# Patient Record
Sex: Female | Born: 1990 | Race: White | Hispanic: No | Marital: Married | Smoking: Never smoker
Health system: Southern US, Community
[De-identification: ages and names within clinical notes are randomized; demographics above are authoritative.]

## PROBLEM LIST (undated history)

## (undated) DIAGNOSIS — N750 Cyst of Bartholin's gland: Secondary | ICD-10-CM

## (undated) DIAGNOSIS — B079 Viral wart, unspecified: Secondary | ICD-10-CM

## (undated) DIAGNOSIS — I10 Essential (primary) hypertension: Secondary | ICD-10-CM

## (undated) DIAGNOSIS — F419 Anxiety disorder, unspecified: Secondary | ICD-10-CM

## (undated) DIAGNOSIS — R519 Headache, unspecified: Secondary | ICD-10-CM

## (undated) DIAGNOSIS — F32A Depression, unspecified: Secondary | ICD-10-CM

## (undated) DIAGNOSIS — G8929 Other chronic pain: Secondary | ICD-10-CM

## (undated) DIAGNOSIS — G43909 Migraine, unspecified, not intractable, without status migrainosus: Secondary | ICD-10-CM

## (undated) DIAGNOSIS — R51 Headache: Secondary | ICD-10-CM

## (undated) HISTORY — DX: Viral wart, unspecified: B07.9

## (undated) HISTORY — DX: Cyst of Bartholin's gland: N75.0

## (undated) HISTORY — DX: Other chronic pain: G89.29

## (undated) HISTORY — DX: Headache, unspecified: R51.9

## (undated) HISTORY — DX: Anxiety disorder, unspecified: F41.9

## (undated) HISTORY — DX: Headache: R51

## (undated) HISTORY — DX: Essential (primary) hypertension: I10

## (undated) HISTORY — DX: Migraine, unspecified, not intractable, without status migrainosus: G43.909

## (undated) HISTORY — DX: Depression, unspecified: F32.A

---
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
## 2010-06-19 ENCOUNTER — Ambulatory Visit: Payer: Self-pay | Admitting: Family Medicine

## 2010-06-19 DIAGNOSIS — R5381 Other malaise: Secondary | ICD-10-CM | POA: Insufficient documentation

## 2010-06-19 DIAGNOSIS — R5383 Other fatigue: Secondary | ICD-10-CM

## 2010-06-19 DIAGNOSIS — L738 Other specified follicular disorders: Secondary | ICD-10-CM | POA: Insufficient documentation

## 2010-06-20 ENCOUNTER — Encounter: Payer: Self-pay | Admitting: Family Medicine

## 2010-06-20 LAB — CONVERTED CEMR LAB
ALT: 12 units/L (ref 0–35)
AST: 20 units/L (ref 0–37)
Albumin: 4.7 g/dL (ref 3.5–5.2)
Alkaline Phosphatase: 76 units/L (ref 39–117)
BUN: 11 mg/dL (ref 6–23)
CO2: 25 meq/L (ref 19–32)
Calcium: 10.1 mg/dL (ref 8.4–10.5)
Chloride: 106 meq/L (ref 96–112)
Creatinine, Ser: 0.67 mg/dL (ref 0.40–1.20)
Ferritin: 43 ng/mL (ref 10–291)
Glucose, Bld: 99 mg/dL (ref 70–99)
HCT: 43.3 % (ref 36.0–46.0)
Hemoglobin: 14 g/dL (ref 12.0–15.0)
MCHC: 32.3 g/dL (ref 30.0–36.0)
MCV: 82.6 fL (ref 78.0–100.0)
Platelets: 264 10*3/uL (ref 150–400)
Potassium: 3.9 meq/L (ref 3.5–5.3)
RBC: 5.24 M/uL — ABNORMAL HIGH (ref 3.87–5.11)
RDW: 13.4 % (ref 11.5–15.5)
Sodium: 142 meq/L (ref 135–145)
Total Bilirubin: 0.3 mg/dL (ref 0.3–1.2)
Total Protein: 7.5 g/dL (ref 6.0–8.3)
Vitamin B-12: 290 pg/mL (ref 211–911)
WBC: 9 10*3/uL (ref 4.0–10.5)

## 2010-07-13 ENCOUNTER — Telehealth: Payer: Self-pay | Admitting: Family Medicine

## 2010-07-14 ENCOUNTER — Encounter: Payer: Self-pay | Admitting: Family Medicine

## 2010-08-29 NOTE — Assessment & Plan Note (Signed)
Summary: Acute pharyngitis   Vital Signs:  Patient profile:   20 year old female Height:      65.75 inches Weight:      171 pounds BMI:     27.91 O2 Sat:      99 % Temp:     98.8 degrees F oral Pulse rate:   100 / minute BP sitting:   146 / 88  (right arm) Cuff size:   regular  Vitals Entered BySelena Batten Johnson/April (September 16, 2009 1:47 PM) CC: c/o sore throat, congestion and cough x 5 days   Primary Care Provider:  Seymour Bars DO  CC:  c/o sore throat and congestion and cough x 5 days.  History of Present Illness: c/o sore throat, chest congestion and cough x 5 days.  No ST.  Some bilat ear pressure started yesterday. Fever low grade. Treated with Advil and fever resolved. sTarted withDayquail and then tried Theraflu for last 2 days. At night time taking Nyquail.  + sickcontacts.  Has some bodyaches the first day.  Never had flu shot. Throats hurts worse but not as tired.  Sleeping alot.    Current Medications (verified): 1)  Amitriptyline Hcl 25 Mg Tabs (Amitriptyline Hcl) .Marland Kitchen.. 1 Tab By Mouth Qhs 2)  Drysol 20 % Soln (Aluminum Chloride) .... Apply At Night 3)  Amerge 2.5 Mg Tabs (Naratriptan Hcl) .Marland Kitchen.. 1 Tab By Mouth X 1, Repeat in 4 Hrs If Needed 4)  Ibuprofen 800 Mg Tabs (Ibuprofen) .Marland Kitchen.. 1 Tab By Mouth Three Times A Day As Needed With Food For Headaches  Allergies (verified): No Known Drug Allergies  Physical Exam  General:  Well-developed,well-nourished,in no acute distress; alert,appropriate and cooperative throughout examination Head:  Normocephalic and atraumatic without obvious abnormalities. No apparent alopecia or balding. Eyes:  No corneal or conjunctival inflammation noted. EOMI. Perrla. Ears:  External ear exam shows no significant lesions or deformities.  Otoscopic examination reveals clear canals, tympanic membranes are intact bilaterally without bulging, retraction, inflammation or discharge. Hearing is grossly normal bilaterally. Nose:  External nasal  examination shows no deformity or inflammation. Nasal mucosa are pink and moist without lesions or exudates. Mouth:  Tonsil are swollen.  Neck:  No deformities, masses, or tenderness noted. Lungs:  Normal respiratory effort, chest expands symmetrically. Lungs are clear to auscultation, no crackles or wheezes. Heart:  Normal rate and regular rhythm. S1 and S2 normal without gallop, murmur, click, rub or other extra sounds. Skin:  no rashes.   Cervical Nodes:  No lymphadenopathy noted Psych:  Cognition and judgment appear intact. Alert and cooperative with normal attention span and concentration. No apparent delusions, illusions, hallucinations   Impression & Recommendations:  Problem # 1:  ACUTE PHARYNGITIS (ICD-462) Likely viral. Tonsils are swollen but no erythema. Salt water gargles and lozenges. If not 50% better by Tuesday then call the office and will treat for sinusitis.  Lungs are clear. No sign of bronchitis.    Her updated medication list for this problem includes:    Ibuprofen 800 Mg Tabs (Ibuprofen) .Marland Kitchen... 1 tab by mouth three times a day as needed with food for headaches  Complete Medication List: 1)  Amitriptyline Hcl 25 Mg Tabs (Amitriptyline hcl) .Marland Kitchen.. 1 tab by mouth qhs 2)  Drysol 20 % Soln (Aluminum chloride) .... Apply at night 3)  Amerge 2.5 Mg Tabs (Naratriptan hcl) .Marland Kitchen.. 1 tab by mouth x 1, repeat in 4 hrs if needed 4)  Ibuprofen 800 Mg Tabs (Ibuprofen) .Marland Kitchen.. 1 tab by  mouth three times a day as needed with food for headaches

## 2010-08-29 NOTE — Assessment & Plan Note (Signed)
Summary: folliculitis   Vital Signs:  Patient profile:   20 year old female Height:      65.75 inches Weight:      176 pounds Temp:     98.3 degrees F oral Pulse rate:   66 / minute BP sitting:   123 / 65  (left arm) Cuff size:   regular  Vitals Entered By: Kathlene November LPN (June 19, 2010 1:20 PM) CC: breaking out with pin head size bumps on  chest and legs- they come and go and leave dark red spots where they were. Onset few months ago   Primary Care Provider:  Seymour Bars DO  CC:  breaking out with pin head size bumps on  chest and legs- they come and go and leave dark red spots where they were. Onset few months ago.  History of Present Illness: 20 yo WF presents for tiny pustules on her chest and legs that started about 6 wks ago.  She denies any pain.  She thought it was spider bites at the onset but it spread to her chest and upper back.  Denies any itching.  Denies new soaps or lotions.  Not around any pets.    She c/o being tired all the time.  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    Current Medications (verified): 1)  Topamax 100 Mg Tabs (Topiramate) .... Take 1 Tab By Mouth At Bedtime 2)  Imitrex 50 Mg Tabs (Sumatriptan Succinate) .... Take As Directed As Needed  Allergies (verified): No Known Drug Allergies  Comments:  Nurse/Medical Assistant: The patient's medications and allergies were reviewed with the patient and were updated in the Medication and Allergy Lists. Kathlene November LPN (June 19, 2010 1:22 PM)  Social History: Reviewed history from 04/11/2009 and no changes required. Lives with mom, dad and younger brother. Student at [employer], lives at home . Involved in drauma.  Review of Systems      See HPI  Physical Exam  General:  alert, well-developed, well-nourished, and well-hydrated.   Mouth:  pharynx pink and moist.   Neck:  no masses.   Lungs:  Normal respiratory effort, chest expands symmetrically. Lungs are clear to  auscultation, no crackles or wheezes. Heart:  Normal rate and regular rhythm. S1 and S2 normal without gallop, murmur, click, rub or other extra sounds. Skin:  tiny pustules over the chest, upper back with healed papular lesions over thighs, non tender with no underlying erythema.  No drainage.  No excoriations or edema  mild comedonal facial acne Cervical Nodes:  No lymphadenopathy noted   Impression & Recommendations:  Problem # 1:  FOLLICULITIS (ICD-704.8) Probable bacterial folliculitis over chest, back and legs. will treat mild case with topical clindamycin foam, dial soap and  water. Call if not improved after 10 days.  Problem # 2:  FATIGUE (ICD-780.79) Will screen for anemia/ iron/ B12 def. today.   Orders: T-CBC No Diff ([id number]) T-Vitamin B12 ([id number]) T-Ferritin ([id number])  Complete Medication List: 1)  Topamax 100 Mg Tabs (Topiramate) .... Take 1 tab by mouth at bedtime 2)  Imitrex 50 Mg Tabs (Sumatriptan succinate) .... Take as directed as needed 3)  Clindamycin Phosphate 1 % Foam (Clindamycin phosphate) .... Apply to rash bid  Other Orders: T-Comprehensive Metabolic Panel ([id number])  Patient Instructions: 1)  Treat folliculitis with topical clindamycin foam 2 x a day.   2)  Use Dial soap once daily in the shower. 3)  Avoid picking lesions. 4)  LABS TODAY.  Will call you w/ results tomorrow. Prescriptions: CLINDAMYCIN PHOSPHATE 1 % FOAM (CLINDAMYCIN PHOSPHATE) apply to rash bid  #100 g x 1   Entered and Authorized by:   Seymour Bars DO   Signed by:   Seymour Bars DO on 06/19/2010   Method used:   Electronically to        [facility] [phone]* (retail)       [address] [place], [place]  [address]       Ph: [phone] or [phone]       Fax: [phone]   RxID:   [id number]    Orders Added: 1)  T-Comprehensive Metabolic Panel [[id number]] 2)  T-CBC No Diff [[id number]] 3)  T-Vitamin B12 [[id number]] 4)  T-Ferritin  [[id number]] 5)  Est. Patient Level III [25956]

## 2010-08-29 NOTE — Assessment & Plan Note (Signed)
Summary: shoulder tendonitis/ URI   Vital Signs:  Patient profile:   20 year old female Height:      65.75 inches Weight:      177 pounds BMI:     28.89 O2 Sat:      100 % on Room air Pulse rate:   99 / minute BP sitting:   137 / 86  (left arm) Cuff size:   regular  Vitals Entered By: Payton Spark CMA (Nov 30, 2009 9:45 AM)  O2 Flow:  Room air CC: R shoulder pain x 2months- no known injury. Also c/o sinus congestion x 4 days.   Primary Care Provider:  Seymour Bars DO  CC:  R shoulder pain x 2months- no known injury. Also c/o sinus congestion x 4 days.Marland Kitchen  History of Present Illness: 20 yo WF presents for problems with R shoulder pain x 2 mos.  She just finished her first year of college.  She did not have any trauma to her shoulder.  She has been going to [employer] and she thinks that she pulled a muscle when weight lifting.  She has pain with pulling up her parking break and can no longer sleep with her L arm under her pillow.  It hurts all the time.  She has not taken anything for pain.  It hurts mostly with the lat pull down machine.  Has pain with full flexion of the R arm.  No radiation of pain.  Has some pain with adduction.  She has had a runny nose, sore throat that began 4 days ago.  She is coughing now.  Has improved some with OTC cold meds.  No F/C, sinus pressure of GI upset.   Current Medications (verified): 1)  Topamax 100 Mg Tabs (Topiramate) .... Take 1 Tab By Mouth At Bedtime 2)  Imitrex 50 Mg Tabs (Sumatriptan Succinate) .... Take As Directed As Needed 3)  Ibuprofen 800 Mg Tabs (Ibuprofen) .Marland Kitchen.. 1 Tab By Mouth Three Times A Day As Needed With Food For Headaches 4)  Baclofen 10 Mg Tabs (Baclofen) .... Take As Directed As Needed  Allergies (verified): No Known Drug Allergies  Past History:  Past Medical History: Reviewed history from 06/28/2008 and no changes required. warts, s/p cryo chronic daily HA  Social History: Reviewed history from 04/11/2009  and no changes required. Lives with mom, dad and younger brother. Student at [employer], lives at home . Involved in drauma.  Review of Systems      See HPI  Physical Exam  General:  alert, well-developed, well-nourished, and well-hydrated.   Head:  normocephalic and atraumatic.  sinuses NTTP Eyes:  conjunctiva clear Ears:  EACs patent; TMs translucent and gray with good cone of light and bony landmarks.  Nose:  nasal congestion with clear- yellow rhinorrhea and boggy turbinates Mouth:  throat mildly injected, good dentition and pharynx pink and moist.   Neck:  no masses.   Lungs:  Normal respiratory effort, chest expands symmetrically. Lungs are clear to auscultation, no crackles or wheezes. Heart:  Normal rate and regular rhythm. S1 and S2 normal without gallop, murmur, click, rub or other extra sounds. Msk:  tender over R AC joint with full active R glenohumeral ROM.  Tender with full R shoulder flexion, external rotation and abduction with + Hawkins test.  grip + 5/5.  full active C spine ROM Pulses:  2+ radial pulses Skin:  color normal.   Cervical Nodes:  shotty anterior cervical chain LA   Impression &  Recommendations:  Problem # 1:  BURSITIS, RIGHT SHOULDER (ICD-726.10) Secondary to new onset weight lifting program. Start treatment with 1) relative rest 2) RX Naprosyn ( while holding the RX ibuprofen ) 3) Icy Hot x 2-3 wks.  If not improved in 3 wks, will get an XRay and consider steroid injection.  Problem # 2:  VIRAL URI (ICD-465.9)  Her updated medication list for this problem includes:    Ibuprofen 800 Mg Tabs (Ibuprofen) .Marland Kitchen... 1 tab by mouth three times a day as needed with food for headaches    Naproxen 500 Mg Tabs (Naproxen) .Marland Kitchen... 1 tab by mouth two times a day with food x 2 wks  Instructed on symptomatic treatment. Call if symptoms persist or worsen.   Complete Medication List: 1)  Topamax 100 Mg Tabs (Topiramate) .... Take 1 tab by mouth at bedtime 2)  Imitrex 50  Mg Tabs (Sumatriptan succinate) .... Take as directed as needed 3)  Ibuprofen 800 Mg Tabs (Ibuprofen) .Marland Kitchen.. 1 tab by mouth three times a day as needed with food for headaches 4)  Baclofen 10 Mg Tabs (Baclofen) .... Take as directed as needed 5)  Naproxen 500 Mg Tabs (Naproxen) .Marland Kitchen.. 1 tab by mouth two times a day with food x 2 wks  Patient Instructions: 1)  Use Naproxen with breakfast and dinner x 2 wks for shoulder tendonitis.  Stop Ibuprofen while you are on this. 2)  Use OTC Mucinex -D for head congestion (get from behind the pharmacy counter) and use Delsym as needed for cough. 3)  Use Icy Hot topically for shoulder pain. 4)  If Shoulder pain has not improved in 3 wks, schedule a follow up appt.   Prescriptions: NAPROXEN 500 MG TABS (NAPROXEN) 1 tab by mouth two times a day with food x 2 wks  #28 x 0   Entered and Authorized by:   Seymour Bars DO   Signed by:   Seymour Bars DO on 11/30/2009   Method used:   [facility] to        [facility] [phone][facility] (retail)       [address]       [place], [place]  [address]       Ph: [phone] or [phone]       Fax: [phone]   RxID:   [id number]

## 2010-08-31 NOTE — Progress Notes (Signed)
Summary: rash no better  Phone Note Call from Patient Call back at Home Phone [phone] Call back at [phone]   Call For: Seymour Bars DO Summary of Call: Pt states rash on chest is worse and has spread to her face. Is using the foam you gave her and didn't know what she needed to do- come in? or call different med in? Initial call taken by: Kathlene November LPN,  July 13, 2010 9:38 AM  Follow-up for Phone Call        I will get her in with derm as soon as possible. Follow-up by: Seymour Bars DO,  July 13, 2010 9:43 AM

## 2010-08-31 NOTE — Consult Note (Signed)
Summary: [facility]   Imported By: Lanelle Bal 08/18/2010 11:15:33  _____________________________________________________________________  External Attachment:    Type:   Image     Comment:   External Document

## 2011-01-15 ENCOUNTER — Encounter: Payer: Self-pay | Admitting: Family Medicine

## 2011-01-16 ENCOUNTER — Encounter: Payer: Self-pay | Admitting: Family Medicine

## 2011-01-16 ENCOUNTER — Ambulatory Visit (INDEPENDENT_AMBULATORY_CARE_PROVIDER_SITE_OTHER): Payer: BC Managed Care – PPO | Admitting: Family Medicine

## 2011-01-16 VITALS — BP 132/83 | HR 91 | Temp 98.4°F | Ht 66.0 in | Wt 182.0 lb

## 2011-01-16 DIAGNOSIS — R197 Diarrhea, unspecified: Secondary | ICD-10-CM

## 2011-01-16 DIAGNOSIS — K589 Irritable bowel syndrome without diarrhea: Secondary | ICD-10-CM | POA: Insufficient documentation

## 2011-01-16 NOTE — Patient Instructions (Signed)
Labs today.  Will call you w/ results tomorrow.  If labs come back normal, start treatment for IBS.  You can certainly add a fiber supplement like Benefiber daily.  Work on dietary changes, stress reduction and you can add Pepcid AC 2 x a day for dyspepsia.   Diet & IBS  (Irritable Bowel Syndrome) No cure has been found for IBS. Many options are available to treat the symptoms. Your caregivers will give you the best treatments available for your symptoms. They will also encourage you to manage stress and make changes to your diet. You need to work with your doctor and Registered Dietician to find the best combination of medicine, diet, counseling, and support to control your symptoms. The following are some diet suggestions. SOME FOOD MAKE IBS WORSE These foods are:  Fatty foods like french fries.   Milk products like cheese or ice cream.   Chocolate.   Alcohol.   Caffeine (found in coffee and some sodas).   Carbonated drinks like soda.  If certain foods cause symptoms, you should eat less of them or stop eating them. CAREFUL EATING REDUCES IBS SYMPTOMS.   Before changing your diet, keep a journal of the foods that seem to cause distress. Then discuss your findings with your caregivers. Write down this information:   What you are eating during the day and when.   What problems are you having after eating?   When do the symptoms occur in relation to your meals?   What foods always make you feel badly?   Take your notes with you to the caregiver to see if you should stop eating certain foods.  SOME FOODS MAKE IBS BETTER. Fiber reduces IBS symptoms, especially constipation, because it makes stools soft, bulky, and easier to pass. Fiber is found in bran, bread, cereal, beans, fruit, and vegetables.  Some examples of foods with fiber:  Fruits.   Apples.   Peaches.   Pears.   Berries.   Figs.    Vegetables.   Broccoli, raw.   Cabbage.   Carrots.  Raw peas.     Breads, cereals, and beans.   Kidney beans.  Lima beans.   Whole-grain bread.  Whole-grain cereal.    Add foods with fiber to your diet a little at a time. This will let your body get used to them. Too much fiber at once might cause gas and swelling of your belly (abdomen). This can trigger symptoms in a person with IBS.   Caregivers usually recommend a diet with enough fiber to produce soft, painless bowel movements. High-fiber diets may cause gas and bloating. These symptoms often go away within a few weeks as your body adjusts. In many cases, dietary fiber may lessen IBS symptoms, particularly constipation. However, it may not help pain or diarrhea. High-fiber diets keep the colon mildly distended (enlarged with the added fiber). This may help prevent spasms in the colon. Some forms of fiber also keep water in the stool, thereby preventing hard stools that are difficult to pass.   Besides telling you to eat more foods with fiber, your caregiver may also tell you to get more fiber by taking a fiber pill or drinking water mixed with a special high-fiber powder. An example of this is a natural fiber laxative containing psyllium seed.  HOW MUCH YOU EAT IS IMPORTANT:  Large meals can cause cramping and diarrhea in people with IBS.   If this happens to you, try eating 4 or 5 small meals  a day.   Or, have your usual 3 meals, but eat less at each meal.   It may also help if your meals are low in fat and high in carbohydrates. Examples of carbohydrates are pasta, rice, whole-grain breads and cereals, fruits and vegetables.   If dairy products cause your symptoms to flare up, you can try eating less of those foods.   You might be able to handle yogurt better than other dairy products because it contains bacteria that helps with digestion.   Dairy products are an important source of calcium and other nutrients. If you need to avoid dairy products, be sure to talk with a Registered Dietitian  about getting these nutrients through other food sources.   Drinking 6 to 8 glasses of plain water a day is important, especially if you have diarrhea.  *You may want to consult a Registered Dietitian who can help you make changes to your diet.  FOR MORE INFORMATION [employer] [address] [place], [place] [address] Email: iffgd@iffgd .org  Internet: www.iffgd.org  National Digestive Diseases Information Clearinghouse (NIDDK) [address] [place], [place] [address] Email: nddic@info .[employer].si  Document Released: 10/06/2003 Document Re-Released: 01/03/2010 [facility] Patient Information 2011 [place], [place].

## 2011-01-16 NOTE — Progress Notes (Signed)
  Subjective:    Patient ID: Sarah Gonzales, female    DOB: 1991/03/31, 20 y.o.   MRN: [id number]  HPI  20 yo WF presents for a change in her bowels over the past 2 wks.  She is having 2-3 BMs/ day which is an increase in her normal.  She has noticed a clear mucous in her stool.  Denies watery stools or blood in her stool.  Usually right after eating. Denies any changes in her diet or relation with her food and stools. Denies constipation.  Denies cramping but has some nausea w/o vomitting.  She feels more bloated.  She has not recently been on antibiotics.  Has not taken anything OTC for her symptoms.  Denies any change in her weight.  Has had a drop in her appetitite.  LMP was early June.  BP 132/83  Pulse 91  Temp(Src) 98.4 F (36.9 C) (Oral)  Ht 5\' 6"  (1.676 m)  Wt 182 lb (82.555 kg)  BMI 29.38 kg/m2  SpO2 99%  LMP 01/02/2011    Review of Systems  Constitutional: Positive for appetite change. Negative for fever, chills and fatigue.  Respiratory: Negative for shortness of breath.   Cardiovascular: Negative for chest pain, palpitations and leg swelling.  Gastrointestinal: Positive for nausea, abdominal pain, diarrhea and abdominal distention. Negative for vomiting, constipation and blood in stool.  Genitourinary: Negative for difficulty urinating.  Skin: Negative for rash.  Neurological: Negative for light-headedness.  Psychiatric/Behavioral: Negative for dysphoric mood. The patient is nervous/anxious.        Objective:   Physical Exam  Constitutional: She appears well-developed and well-nourished.  HENT:  Mouth/Throat: Oropharynx is clear and moist.  Eyes: No scleral icterus.  Neck: Neck supple.  Cardiovascular: Normal rate, regular rhythm and normal heart sounds.   Pulmonary/Chest: Effort normal and breath sounds normal.  Abdominal: Soft. Bowel sounds are normal. She exhibits no distension and no mass. There is no tenderness. There is no guarding.  Musculoskeletal: She exhibits  no edema.  Lymphadenopathy:    She has no cervical adenopathy.  Skin: Skin is warm and dry. No pallor.  Psychiatric: She has a normal mood and affect.          Assessment & Plan:

## 2011-01-16 NOTE — Assessment & Plan Note (Signed)
Likely IBS given several wks of loser but not watery or bloody stools with bloating and mucous in stools.  Cannot r/o a dietary intolerance at this point.  Will r/o biliary colic with labs today.  F/u results tomorrow.  Stick to bland diet.  Work on stress reduction and add a fiber supplement daily.

## 2011-01-17 ENCOUNTER — Telehealth: Payer: Self-pay | Admitting: Family Medicine

## 2011-01-17 LAB — COMPLETE METABOLIC PANEL WITH GFR
ALT: 17 U/L (ref 0–35)
AST: 17 U/L (ref 0–37)
Albumin: 4.9 g/dL (ref 3.5–5.2)
Alkaline Phosphatase: 75 U/L (ref 39–117)
BUN: 9 mg/dL (ref 6–23)
CO2: 25 mEq/L (ref 19–32)
Calcium: 9.8 mg/dL (ref 8.4–10.5)
Chloride: 105 mEq/L (ref 96–112)
Creat: 0.66 mg/dL (ref 0.50–1.10)
GFR, Est African American: >60 mL/min (ref >60)
GFR, Est Non African American: >60 mL/min (ref >60)
Glucose, Bld: 80 mg/dL (ref 70–99)
Potassium: 4 mEq/L (ref 3.5–5.3)
Sodium: 138 mEq/L (ref 135–145)
Total Bilirubin: 0.3 mg/dL (ref 0.3–1.2)
Total Protein: 7.6 g/dL (ref 6.0–8.3)

## 2011-01-17 LAB — CBC WITH DIFFERENTIAL/PLATELET
Basophils Absolute: 0 10*3/uL (ref 0.0–0.1)
Basophils Relative: 1 % (ref 0–1)
Eosinophils Absolute: 0.1 10*3/uL (ref 0.0–0.7)
Eosinophils Relative: 2 % (ref 0–5)
HCT: 44.8 % (ref 36.0–46.0)
Hemoglobin: 14.5 g/dL (ref 12.0–15.0)
Lymphocytes Relative: 30 % (ref 12–46)
Lymphs Abs: 1.9 10*3/uL (ref 0.7–4.0)
MCH: 26.7 pg (ref 26.0–34.0)
MCHC: 32.4 g/dL (ref 30.0–36.0)
MCV: 82.4 fL (ref 78.0–100.0)
Monocytes Absolute: 0.5 10*3/uL (ref 0.1–1.0)
Monocytes Relative: 8 % (ref 3–12)
Neutro Abs: 3.8 10*3/uL (ref 1.7–7.7)
Neutrophils Relative %: 61 % (ref 43–77)
Platelets: 257 10*3/uL (ref 150–400)
RBC: 5.44 MIL/uL — ABNORMAL HIGH (ref 3.87–5.11)
RDW: 15 % (ref 11.5–15.5)
WBC: 6.3 10*3/uL (ref 4.0–10.5)

## 2011-01-17 LAB — AMYLASE: Amylase: 51 U/L (ref 0–105)

## 2011-01-17 LAB — TSH: TSH: 1.282 u[IU]/mL (ref 0.350–4.500)

## 2011-01-17 LAB — LIPASE: Lipase: 25 U/L (ref 0–75)

## 2011-01-17 NOTE — Telephone Encounter (Signed)
LMOM informing Pt of the above 

## 2011-01-17 NOTE — Telephone Encounter (Signed)
Pls let pt know that all of her labs came back normal.  Nothing to explain her GI symptoms.  I would recommend following treatment for IBS and adding Pepcid AC 2 x  A day.  Call if symptoms have not improved in 10 days.

## 2011-12-18 ENCOUNTER — Telehealth: Payer: Self-pay | Admitting: *Deleted

## 2011-12-18 MED ORDER — ALUMINUM CHLORIDE 20 % EX SOLN: Freq: Every day | CUTANEOUS | Status: DC

## 2011-12-18 NOTE — Telephone Encounter (Signed)
Can use every nigth for 1-2 weeks then every other night adn finally every 3-4 nights for maintenance. If causing irritation then dec how often using it.

## 2011-12-18 NOTE — Telephone Encounter (Signed)
Mom is requesting for pt to have an rx sent over for the deoderant Drysol. Please advise.

## 2011-12-19 NOTE — Telephone Encounter (Signed)
Pts mom informed

## 2012-05-22 ENCOUNTER — Encounter: Payer: Self-pay | Admitting: Family Medicine

## 2012-05-22 ENCOUNTER — Ambulatory Visit (INDEPENDENT_AMBULATORY_CARE_PROVIDER_SITE_OTHER): Payer: BC Managed Care – PPO | Admitting: Family Medicine

## 2012-05-22 DIAGNOSIS — L309 Dermatitis, unspecified: Secondary | ICD-10-CM

## 2012-05-22 DIAGNOSIS — M79669 Pain in unspecified lower leg: Secondary | ICD-10-CM

## 2012-05-22 DIAGNOSIS — M79609 Pain in unspecified limb: Secondary | ICD-10-CM

## 2012-05-22 DIAGNOSIS — F39 Unspecified mood [affective] disorder: Secondary | ICD-10-CM

## 2012-05-22 DIAGNOSIS — Z23 Encounter for immunization: Secondary | ICD-10-CM

## 2012-05-22 DIAGNOSIS — J069 Acute upper respiratory infection, unspecified: Secondary | ICD-10-CM

## 2012-05-22 DIAGNOSIS — L259 Unspecified contact dermatitis, unspecified cause: Secondary | ICD-10-CM

## 2012-05-22 MED ORDER — TRIAMCINOLONE ACETONIDE 0.5 % EX OINT: TOPICAL_OINTMENT | Freq: Every day | CUTANEOUS | Status: DC
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
## 2012-05-22 NOTE — Patient Instructions (Signed)

## 2012-06-12 ENCOUNTER — Encounter (HOSPITAL_COMMUNITY): Payer: Self-pay | Admitting: Psychiatry

## 2012-06-12 ENCOUNTER — Ambulatory Visit (INDEPENDENT_AMBULATORY_CARE_PROVIDER_SITE_OTHER): Payer: BC Managed Care – PPO | Admitting: Psychiatry

## 2012-06-12 VITALS — BP 129/85 | HR 98 | Ht 65.5 in | Wt 192.0 lb

## 2012-06-12 DIAGNOSIS — F39 Unspecified mood [affective] disorder: Secondary | ICD-10-CM
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
## 2012-07-07 ENCOUNTER — Telehealth (HOSPITAL_COMMUNITY): Payer: Self-pay | Admitting: Psychiatry

## 2012-07-07 NOTE — Telephone Encounter (Addendum)
The patient reports being stressed, due to stress of finals. The patient reports feeling like she has sleeping less. She denies SI/HI/AVH.  She endorses mood swings but no manic episodes.   Patient's mother endorses mood swings started the end of  Highschool beginning of college. She denied any symptoms significant of a manic episode or any suicidal or homicidal behavior. The patient mother reports that for Meryle has always caused stressor, large or small to affect her moods.

## 2012-07-10 ENCOUNTER — Ambulatory Visit (INDEPENDENT_AMBULATORY_CARE_PROVIDER_SITE_OTHER): Payer: BC Managed Care – PPO | Admitting: Psychiatry

## 2012-07-10 ENCOUNTER — Encounter (HOSPITAL_COMMUNITY): Payer: Self-pay | Admitting: Psychiatry

## 2012-07-10 VITALS — BP 134/77 | HR 96 | Ht 65.5 in | Wt 198.0 lb

## 2012-07-10 DIAGNOSIS — F3181 Bipolar II disorder: Secondary | ICD-10-CM | POA: Insufficient documentation

## 2012-07-10 DIAGNOSIS — F319 Bipolar disorder, unspecified: Secondary | ICD-10-CM

## 2012-07-10 DIAGNOSIS — F3189 Other bipolar disorder: Secondary | ICD-10-CM

## 2012-07-10 DIAGNOSIS — F429 Obsessive-compulsive disorder, unspecified: Secondary | ICD-10-CM

## 2012-07-10 MED ORDER — LAMOTRIGINE 25 MG PO TABS: ORAL_TABLET | ORAL | Status: DC
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
## 2012-07-25 ENCOUNTER — Ambulatory Visit (INDEPENDENT_AMBULATORY_CARE_PROVIDER_SITE_OTHER): Payer: BC Managed Care – PPO | Admitting: Family Medicine

## 2012-07-25 VITALS — BP 127/81 | HR 62 | Wt 197.0 lb

## 2012-07-25 DIAGNOSIS — R21 Rash and other nonspecific skin eruption: Secondary | ICD-10-CM

## 2012-07-25 MED ORDER — METHYLPREDNISOLONE ACETATE 80 MG/ML IJ SUSP: 120.0000 mg | Freq: Once | INTRAMUSCULAR | Status: DC

## 2012-07-25 MED ORDER — METHYLPREDNISOLONE ACETATE 80 MG/ML IJ SUSP
80.0000 mg | Freq: Once | INTRAMUSCULAR | Status: AC
  Administered 2012-07-25: 80 mg via INTRAMUSCULAR

## 2012-07-25 NOTE — Progress Notes (Signed)
CC: Sarah Gonzales is a 21 y.o. female is here for Rash   Subjective: HPI:  Patient presents due to concerns of a rash. It has been present for about 24 hours. It is non-itchy and causes her no discomfort. She's never had this before. It is located on her thighs and slightly on her shins. There are no other locations of abnormal appearing skin. The only change in her environment and lifestyle has been starting Lamictal a few days ago she is currently tapering up on this for treatment of bipolar disorder. She is adamant that this is not being used for epilepsy. No interventions as of yet, nothing seems to make the rash better or worse. She describes it as mild in severity. She denies fevers, chills, palpitations, racing heartbeat, shortness of breath, wheezing, flushing, GI disturbance nausea,, nor confusion   Review Of Systems Outlined In HPI  Past Medical History  Diagnosis Date  . Warts   . Chronic headaches     Since the age of 8, severe in the past.     Family History  Problem Relation Age of Onset  . Hypertension Father   . Asthma      aunt  . Bipolar disorder Maternal Aunt   . Hypertension Mother   . Hypertension Maternal Grandfather   . Lung cancer Maternal Grandfather   . Hypertension Maternal Grandmother   . Hypertension Paternal Grandfather   . Hypertension Paternal Grandmother      History  Substance Use Topics  . Smoking status: Never Smoker   . Smokeless tobacco: Not on file  . Alcohol Use: 1.0 oz/week    2 drink(s) per week     Objective: Filed Vitals:   07/25/12 1532  BP: 127/81  Pulse: 62    General: Alert and Oriented, No Acute Distress HEENT: Pupils equal, round, reactive to light. Conjunctivae clear.  Moist mucous membranes, pharynx without inflammation nor lesions.  Neck supple without palpable lymphadenopathy nor abnormal masses. Lungs: Clear to auscultation bilaterally, no wheezing/ronchi/rales.  Comfortable work of breathing. Good air  movement. Cardiac: Regular rate and rhythm. Normal S1/S2.  No murmurs, rubs, nor gallops.   Extremities: No peripheral edema.  Strong peripheral pulses.  Mental Status: No depression, anxiety, nor agitation. Skin: Warm and dry. She has scattered erythematous papules covering the anterior thighs and a few on her shins bilaterally, these do not appear to be located only at hair follicles  Assessment & Plan: Kearia was seen today for rash.  Diagnoses and associated orders for this visit:  Rash - Discontinue: methylPREDNISolone acetate (DEPO-MEDROL) injection 120 mg; Inject 1.5 mLs (120 mg total) into the muscle once. - methylPREDNISolone acetate (DEPO-MEDROL) injection 80 mg; Inject 1 mL (80 mg total) into the muscle once.    I encouraged her to discontinue the Lamictal completely due to rare but serious and life threatening rashes including but not limited to Stevens-Johnson syndrome. Asked her to contact Dr. Demetrius Charity. her behavioral specialist as soon as possible to update him. She reports that she was prepped for the possibility of this rash and that Dr. Demetrius Charity. told her that she would benefit from a steroid shot should a rash develop. This was provided but she understands to go to emergency room for any progression of the rash or overall deterioration  Return if symptoms worsen or fail to improve.

## 2012-08-07 ENCOUNTER — Ambulatory Visit (HOSPITAL_COMMUNITY): Payer: Self-pay | Admitting: Psychiatry

## 2013-01-28 ENCOUNTER — Ambulatory Visit (INDEPENDENT_AMBULATORY_CARE_PROVIDER_SITE_OTHER): Payer: BC Managed Care – PPO | Admitting: Emergency Medicine

## 2013-01-28 VITALS — BP 128/80 | HR 116 | Temp 98.4°F | Resp 18 | Ht 65.0 in | Wt 205.8 lb

## 2013-01-28 DIAGNOSIS — F411 Generalized anxiety disorder: Secondary | ICD-10-CM

## 2013-01-28 MED ORDER — PAROXETINE HCL 20 MG PO TABS: 20.0000 mg | ORAL_TABLET | ORAL | Status: DC

## 2013-01-28 NOTE — Progress Notes (Signed)
[facility] [address] [place] [address] [phone]  Date:  01/28/2013   Name:  Sarah Gonzales   DOB:  07-29-1991   MRN:  [id number]  PCP:  Nani Gasser, MD    Chief Complaint: Shortness of Breath and Leg Pain   History of Present Illness:  Sarah Gonzales is a 22 y.o. very pleasant female patient who presents with the following:  Very anxious.  Not depressed. Says that she has frequent feelings of being unable to get a satisfying breath.  No wheezing, breathlessness, orthopnea, or PND or cough.  This has been a long term problem.  She sleeps well and eats normally.  She just ended a two year job and is getting ready to go to school in the fall.  Was seen by a psychiatrist in the past and was in testing for bipolar disorder but was never completed.  Not on medication.   She had a headache today but she says it is resolved with excedrin migraine.  Patient Active Problem List   Diagnosis Date Noted  . Bipolar II disorder 07/10/2012  . IBS (irritable bowel syndrome) 01/16/2011  . BURSITIS, RIGHT SHOULDER 11/30/2009  . ALLERGIC RHINITIS CAUSE UNSPECIFIED 08/26/2008  . DYSPEPSIA 08/26/2008  . HYPERHIDROSIS 08/26/2008  . MIGRAINE HEADACHE 07/07/2008  . ELEVATED BLOOD PRESSURE 01/26/2008    Past Medical History  Diagnosis Date  . Warts   . Chronic headaches     Since the age of 11, severe in the past.    No past surgical history on file.  History  Substance Use Topics  . Smoking status: Never Smoker   . Smokeless tobacco: Not on file  . Alcohol Use: 1.0 oz/week    2 drink(s) per week    Family History  Problem Relation Age of Onset  . Hypertension Father   . Asthma      aunt  . Bipolar disorder Maternal Aunt   . Hypertension Mother   . Hypertension Maternal Grandfather   . Lung cancer Maternal Grandfather   . Hypertension Maternal Grandmother   . Hypertension Paternal Grandfather   . Hypertension Paternal Grandmother     No Known  Allergies  Medication list has been reviewed and updated.  Current Outpatient Prescriptions on File Prior to Visit  Medication Sig Dispense Refill  . lamoTRIgine (LAMICTAL) 25 MG tablet Take one tablet on week 1, then 2 tablets week 2, then 3 tablets week 3, then 4 tablets daily.  70 tablet  1   No current facility-administered medications on file prior to visit.    Review of Systems:  As per HPI, otherwise negative.    Physical Examination: Filed Vitals:   01/28/13 1926  BP: 128/80  Pulse: 116  Temp: 98.4 F (36.9 C)  Resp: 18   Filed Vitals:   01/28/13 1926  Height: 5\' 5"  (1.651 m)  Weight: 205 lb 12.8 oz (93.35 kg)   Body mass index is 34.25 kg/(m^2). Ideal Body Weight: Weight in (lb) to have BMI = 25: 149.9  GEN: WDWN, NAD, Non-toxic, A & O x 3 HEENT: Atraumatic, Normocephalic. Neck supple. No masses, No LAD. Ears and Nose: No external deformity. CV: RRR, No M/G/R. No JVD. No thrill. No extra heart sounds. PULM: CTA B, no wheezes, crackles, rhonchi. No retractions. No resp. distress. No accessory muscle use. ABD: S, NT, ND, +BS. No rebound. No HSM. EXTR: No c/c/e NEURO Normal gait.  PSYCH: Normally interactive. Conversant. Not depressed or anxious appearing.  Calm demeanor.    Assessment and Plan: Anxiety disorder paxil Follow up in a month  Signed,  Phillips Odor, MD

## 2013-01-28 NOTE — Patient Instructions (Addendum)

## 2013-10-26 ENCOUNTER — Emergency Department (INDEPENDENT_AMBULATORY_CARE_PROVIDER_SITE_OTHER): Payer: BC Managed Care – PPO

## 2013-10-26 ENCOUNTER — Encounter: Payer: Self-pay | Admitting: Emergency Medicine

## 2013-10-26 ENCOUNTER — Emergency Department
Admission: EM | Admit: 2013-10-26 | Discharge: 2013-10-26 | Disposition: A | Discharge status: Home / Self Care | Payer: BC Managed Care – PPO | Source: Home / Self Care | Attending: Family Medicine | Admitting: Family Medicine

## 2013-10-26 DIAGNOSIS — M79609 Pain in unspecified limb: Secondary | ICD-10-CM

## 2013-10-26 DIAGNOSIS — M79671 Pain in right foot: Secondary | ICD-10-CM

## 2013-10-26 MED ORDER — MELOXICAM 15 MG PO TABS: 15.0000 mg | ORAL_TABLET | Freq: Every day | ORAL | Status: DC

## 2013-10-26 NOTE — ED Notes (Signed)
Pt c/o RT foot pain x 2 wks. Denies injury.

## 2013-10-26 NOTE — Discharge Instructions (Signed)
Ensure that shoes have adequate arch supports.

## 2013-10-26 NOTE — ED Provider Notes (Signed)
CSN: [id number]     Arrival date & time 10/26/13  1441 History   First MD Initiated Contact with Patient 10/26/13 1511     Chief Complaint  Patient presents with  . Foot Pain      HPI Comments: Patient complains of pain in her right mid-foot for 2 weeks.  No known injury.  No change in activities or new shoes.  No response to ibuprofen.  Patient is a 23 y.o. female presenting with lower extremity pain. The history is provided by the patient.  Foot Pain This is a new problem. Episode onset: 2 weeks ago. The problem occurs constantly. The problem has not changed since onset.Associated symptoms comments: none. The symptoms are aggravated by walking. Treatments tried: Ibuprofen. The treatment provided no relief.    Past Medical History  Diagnosis Date  . Warts   . Chronic headaches     Since the age of 39, severe in the past.   History reviewed. No pertinent past surgical history. Family History  Problem Relation Age of Onset  . Hypertension Father   . Asthma      aunt  . Bipolar disorder Maternal Aunt   . Hypertension Mother   . Hypertension Maternal Grandfather   . Lung cancer Maternal Grandfather   . Hypertension Maternal Grandmother   . Hypertension Paternal Grandfather   . Hypertension Paternal Grandmother    History  Substance Use Topics  . Smoking status: Never Smoker   . Smokeless tobacco: Not on file  . Alcohol Use: 1.0 oz/week    2 drink(s) per week   OB History   Grav Para Term Preterm Abortions TAB SAB Ect Mult Living                 Review of Systems  All other systems reviewed and are negative.    Allergies  Review of patient's allergies indicates no known allergies.  Home Medications   Current Outpatient Rx  Name  Route  Sig  Dispense  Refill  . etonogestrel-ethinyl estradiol (NUVARING) 0.12-0.015 MG/24HR vaginal ring   Vaginal   Place 1 each vaginally every 28 (twenty-eight) days. Insert vaginally and leave in place for 3 consecutive weeks, then  remove for 1 week.         . lamoTRIgine (LAMICTAL) 25 MG tablet      Take one tablet on week 1, then 2 tablets week 2, then 3 tablets week 3, then 4 tablets daily.   70 tablet   1   . meloxicam (MOBIC) 15 MG tablet   Oral   Take 1 tablet (15 mg total) by mouth daily. Take with food each morning   15 tablet   0   . PARoxetine (PAXIL) 20 MG tablet   Oral   Take 1 tablet (20 mg total) by mouth every morning.   30 tablet   2    BP 154/89  Pulse 90  Temp(Src) 98.1 F (36.7 C) (Oral)  Resp 18  Ht 5' 5.5" (1.664 m)  Wt 198 lb (89.812 kg)  BMI 32.44 kg/m2  SpO2 97%  LMP 10/13/2013 Physical Exam  Nursing note and vitals reviewed. Constitutional: She is oriented to person, place, and time. She appears well-developed and well-nourished. No distress.  Patient is obese (BMI 32.4)  Eyes: Pupils are equal, round, and reactive to light.  Musculoskeletal: She exhibits tenderness.       Right foot: She exhibits tenderness. She exhibits normal range of motion, no swelling, normal capillary refill,  no crepitus, no deformity and no laceration.       Feet:  Right foot has tenderness dorsally over the 3rd, 4th, and 5th metatasals.  No swelling, erythema, or warmth.  Pain is exacerbated by squeezing the forefoot.  Distal neurovascular function is intact.   Neurological: She is alert and oriented to person, place, and time.  Skin: Skin is warm and dry. No rash noted. No erythema.    ED Course  Procedures  none    Imaging Review Dg Foot Complete Right  10/26/2013   CLINICAL DATA:  Right foot pain.  EXAM: RIGHT FOOT COMPLETE - 3+ VIEW  COMPARISON:  None.  FINDINGS: There is no evidence of fracture or dislocation. There is no evidence of arthropathy or other focal bone abnormality. Soft tissues are unremarkable.  IMPRESSION: Normal right foot.   Electronically Signed   By: Irish LackGlenn  Yamagata M.D.   On: 10/26/2013 15:30     MDM   1. Right foot pain; suspect Morton's neuroma or  metatarsalgia.    Begin Mobic. Ensure that shoes have adequate arch supports. Followup with Dr. Rodney Langtonhomas Thekkekandam ([facility]) if not improving about two weeks.     Sarah HawStephen A Cristine Daw, MD 10/31/13 [phone]

## 2013-12-15 ENCOUNTER — Ambulatory Visit (INDEPENDENT_AMBULATORY_CARE_PROVIDER_SITE_OTHER): Payer: BC Managed Care – PPO | Admitting: Physical Therapy

## 2013-12-15 ENCOUNTER — Encounter (INDEPENDENT_AMBULATORY_CARE_PROVIDER_SITE_OTHER): Payer: Self-pay

## 2013-12-15 DIAGNOSIS — M25676 Stiffness of unspecified foot, not elsewhere classified: Secondary | ICD-10-CM

## 2013-12-15 DIAGNOSIS — M25579 Pain in unspecified ankle and joints of unspecified foot: Secondary | ICD-10-CM

## 2013-12-15 DIAGNOSIS — M6281 Muscle weakness (generalized): Secondary | ICD-10-CM

## 2013-12-15 DIAGNOSIS — M25673 Stiffness of unspecified ankle, not elsewhere classified: Secondary | ICD-10-CM

## 2013-12-18 ENCOUNTER — Encounter (INDEPENDENT_AMBULATORY_CARE_PROVIDER_SITE_OTHER): Payer: BC Managed Care – PPO | Admitting: Physical Therapy

## 2013-12-18 DIAGNOSIS — M25579 Pain in unspecified ankle and joints of unspecified foot: Secondary | ICD-10-CM

## 2013-12-18 DIAGNOSIS — M25676 Stiffness of unspecified foot, not elsewhere classified: Secondary | ICD-10-CM

## 2013-12-18 DIAGNOSIS — M25673 Stiffness of unspecified ankle, not elsewhere classified: Secondary | ICD-10-CM

## 2013-12-18 DIAGNOSIS — M6281 Muscle weakness (generalized): Secondary | ICD-10-CM

## 2013-12-23 ENCOUNTER — Encounter (INDEPENDENT_AMBULATORY_CARE_PROVIDER_SITE_OTHER): Payer: BC Managed Care – PPO | Admitting: Physical Therapy

## 2013-12-23 DIAGNOSIS — M25579 Pain in unspecified ankle and joints of unspecified foot: Secondary | ICD-10-CM

## 2013-12-23 DIAGNOSIS — M25676 Stiffness of unspecified foot, not elsewhere classified: Secondary | ICD-10-CM

## 2013-12-23 DIAGNOSIS — M25673 Stiffness of unspecified ankle, not elsewhere classified: Secondary | ICD-10-CM

## 2013-12-23 DIAGNOSIS — M6281 Muscle weakness (generalized): Secondary | ICD-10-CM

## 2013-12-25 ENCOUNTER — Encounter (INDEPENDENT_AMBULATORY_CARE_PROVIDER_SITE_OTHER): Payer: BC Managed Care – PPO | Admitting: Physical Therapy

## 2013-12-25 DIAGNOSIS — M6281 Muscle weakness (generalized): Secondary | ICD-10-CM

## 2013-12-25 DIAGNOSIS — M25539 Pain in unspecified wrist: Secondary | ICD-10-CM

## 2013-12-25 DIAGNOSIS — M25673 Stiffness of unspecified ankle, not elsewhere classified: Secondary | ICD-10-CM

## 2013-12-25 DIAGNOSIS — M25676 Stiffness of unspecified foot, not elsewhere classified: Secondary | ICD-10-CM

## 2013-12-25 DIAGNOSIS — M25579 Pain in unspecified ankle and joints of unspecified foot: Secondary | ICD-10-CM

## 2013-12-28 ENCOUNTER — Encounter (INDEPENDENT_AMBULATORY_CARE_PROVIDER_SITE_OTHER): Payer: BC Managed Care – PPO | Admitting: Physical Therapy

## 2013-12-28 DIAGNOSIS — M6281 Muscle weakness (generalized): Secondary | ICD-10-CM

## 2013-12-28 DIAGNOSIS — M25673 Stiffness of unspecified ankle, not elsewhere classified: Secondary | ICD-10-CM

## 2013-12-28 DIAGNOSIS — M25676 Stiffness of unspecified foot, not elsewhere classified: Secondary | ICD-10-CM

## 2013-12-28 DIAGNOSIS — M25579 Pain in unspecified ankle and joints of unspecified foot: Secondary | ICD-10-CM

## 2013-12-30 ENCOUNTER — Encounter (INDEPENDENT_AMBULATORY_CARE_PROVIDER_SITE_OTHER): Payer: BC Managed Care – PPO | Admitting: Physical Therapy

## 2013-12-30 DIAGNOSIS — M25579 Pain in unspecified ankle and joints of unspecified foot: Secondary | ICD-10-CM

## 2013-12-30 DIAGNOSIS — M25676 Stiffness of unspecified foot, not elsewhere classified: Secondary | ICD-10-CM

## 2013-12-30 DIAGNOSIS — M6281 Muscle weakness (generalized): Secondary | ICD-10-CM

## 2013-12-30 DIAGNOSIS — M25673 Stiffness of unspecified ankle, not elsewhere classified: Secondary | ICD-10-CM

## 2014-01-06 ENCOUNTER — Encounter (INDEPENDENT_AMBULATORY_CARE_PROVIDER_SITE_OTHER): Payer: BC Managed Care – PPO | Admitting: Physical Therapy

## 2014-01-06 DIAGNOSIS — M25673 Stiffness of unspecified ankle, not elsewhere classified: Secondary | ICD-10-CM

## 2014-01-06 DIAGNOSIS — M25579 Pain in unspecified ankle and joints of unspecified foot: Secondary | ICD-10-CM

## 2014-01-06 DIAGNOSIS — M6281 Muscle weakness (generalized): Secondary | ICD-10-CM

## 2014-01-06 DIAGNOSIS — M25676 Stiffness of unspecified foot, not elsewhere classified: Secondary | ICD-10-CM

## 2014-01-08 ENCOUNTER — Encounter: Payer: BC Managed Care – PPO | Admitting: Physical Therapy

## 2014-01-08 DIAGNOSIS — M6281 Muscle weakness (generalized): Secondary | ICD-10-CM

## 2014-01-08 DIAGNOSIS — M25673 Stiffness of unspecified ankle, not elsewhere classified: Secondary | ICD-10-CM

## 2014-01-08 DIAGNOSIS — M25579 Pain in unspecified ankle and joints of unspecified foot: Secondary | ICD-10-CM

## 2014-01-08 DIAGNOSIS — M25676 Stiffness of unspecified foot, not elsewhere classified: Secondary | ICD-10-CM

## 2014-01-15 ENCOUNTER — Other Ambulatory Visit: Payer: Self-pay | Admitting: Orthopaedic Surgery

## 2014-01-15 DIAGNOSIS — R52 Pain, unspecified: Secondary | ICD-10-CM

## 2014-02-17 ENCOUNTER — Ambulatory Visit (INDEPENDENT_AMBULATORY_CARE_PROVIDER_SITE_OTHER): Payer: BC Managed Care – PPO

## 2014-02-17 DIAGNOSIS — M1710 Unilateral primary osteoarthritis, unspecified knee: Secondary | ICD-10-CM

## 2014-02-17 DIAGNOSIS — R52 Pain, unspecified: Secondary | ICD-10-CM

## 2015-01-06 ENCOUNTER — Encounter: Payer: Self-pay | Admitting: *Deleted

## 2015-01-06 ENCOUNTER — Emergency Department (INDEPENDENT_AMBULATORY_CARE_PROVIDER_SITE_OTHER)
Admission: EM | Admit: 2015-01-06 | Discharge: 2015-01-06 | Disposition: A | Discharge status: Home / Self Care | Payer: BLUE CROSS/BLUE SHIELD | Source: Home / Self Care | Attending: Family Medicine | Admitting: Family Medicine

## 2015-01-06 DIAGNOSIS — L282 Other prurigo: Secondary | ICD-10-CM

## 2015-01-06 DIAGNOSIS — L578 Other skin changes due to chronic exposure to nonionizing radiation: Secondary | ICD-10-CM

## 2015-01-06 DIAGNOSIS — W57XXXA Bitten or stung by nonvenomous insect and other nonvenomous arthropods, initial encounter: Secondary | ICD-10-CM

## 2015-01-06 MED ORDER — METHYLPREDNISOLONE ACETATE 80 MG/ML IJ SUSP
80.0000 mg | Freq: Once | INTRAMUSCULAR | Status: AC
  Administered 2015-01-06: 80 mg via INTRAMUSCULAR

## 2015-01-06 MED ORDER — HYDROXYZINE HCL 25 MG PO TABS: 25.0000 mg | ORAL_TABLET | Freq: Three times a day (TID) | ORAL | Status: DC | PRN

## 2015-01-06 NOTE — Discharge Instructions (Signed)

## 2015-01-06 NOTE — ED Provider Notes (Signed)
CSN: [id number]     Arrival date & time 01/06/15  1022 History   First MD Initiated Contact with Patient 01/06/15 1033     Chief Complaint  Patient presents with  . Rash    HPI  HPI  This patient complains of a RASH  Location: diffuse. Multiple focal areas of discrete erythema and pruritis. Mild generalized skin erythema in setting of sun exposure.   Onset: 3-4   Course: multiple focal areas of pruritis and erythema. Pt works outside at [employer]. Denies any known poison ivy exposure, though has seen some on grounds   Self-treated with: nothing   Improvement with treatment: n/a  History  Itching: yes  Tenderness: no  New medications/antibiotics: no  Pet exposure: no  Recent travel or tropical exposure: no  New soaps, shampoos, detergent, clothing: no  Tick/insect exposure: yes, insect exposure. Denies tick exposure   Chemical Exposure: no  Red Flags  Feeling ill: no  Fever: no  Facial/tongue swelling/difficulty breathing: no  Diabetic or immunocompromised: no    Past Medical History  Diagnosis Date  . Warts   . Chronic headaches     Since the age of 29, severe in the past.   History reviewed. No pertinent past surgical history. Family History  Problem Relation Age of Onset  . Hypertension Father   . Asthma      aunt  . Bipolar disorder Maternal Aunt   . Hypertension Mother   . Hypertension Maternal Grandfather   . Lung cancer Maternal Grandfather   . Hypertension Maternal Grandmother   . Hypertension Paternal Grandfather   . Hypertension Paternal Grandmother    History  Substance Use Topics  . Smoking status: Never Smoker   . Smokeless tobacco: Not on file  . Alcohol Use: 1.0 oz/week    2 drink(s) per week   OB History    No data available     Review of Systems  All other systems reviewed and are negative.   Allergies  Review of patient's allergies indicates no known allergies.  Home Medications   Prior to Admission medications    Medication Sig Start Date End Date Taking? Authorizing Provider  etonogestrel-ethinyl estradiol (NUVARING) 0.12-0.015 MG/24HR vaginal ring Place 1 each vaginally every 28 (twenty-eight) days. Insert vaginally and leave in place for 3 consecutive weeks, then remove for 1 week.   Yes Historical Provider, MD  PARoxetine (PAXIL) 20 MG tablet Take 1 tablet (20 mg total) by mouth every morning. 01/28/13  Yes Carmelina Dane, MD  hydrOXYzine (ATARAX/VISTARIL) 25 MG tablet Take 1 tablet (25 mg total) by mouth 3 (three) times daily as needed. 01/06/15   Floydene Flock, MD  lamoTRIgine (LAMICTAL) 25 MG tablet Take one tablet on week 1, then 2 tablets week 2, then 3 tablets week 3, then 4 tablets daily. 07/10/12   Larena Sox, MD   BP 127/84 mmHg  Pulse 84  Temp(Src) 97.5 F (36.4 C) (Oral)  Resp 16  Wt 195 lb (88.451 kg)  SpO2 99%  LMP 12/16/2014 Physical Exam  Constitutional: She appears well-developed and well-nourished.  HENT:  Head: Normocephalic and atraumatic.  Eyes: Conjunctivae are normal. Pupils are equal, round, and reactive to light.  Neck: Normal range of motion. Neck supple.  Cardiovascular: Normal rate and regular rhythm.   Pulmonary/Chest: Effort normal and breath sounds normal.  Abdominal: Soft.  Skin:  Multiple discrete areas of focal erythema on upper and lower extremities. No scaling. No drainage. Non tender.  ED Course  Procedures (including critical care time) Labs Review Labs Reviewed - No data to display  Imaging Review No results found.   MDM   1. Insect bites   2. Sun-damaged skin   3. Pruritic rash    Depomedrol  IM x1 Prn atarax for itching Discussed direct sun exposure avoidance as well as dermatologic and infectious red flags Follow up as needed.     The patient and/or caregiver has been counseled thoroughly with regard to treatment plan and/or medications prescribed including dosage, schedule, interactions, rationale for use, and  possible side effects and they verbalize understanding. Diagnoses and expected course of recovery discussed and will return if not improved as expected or if the condition worsens. Patient and/or caregiver verbalized understanding.         Floydene Flock, MD 01/06/15 1116

## 2015-01-06 NOTE — ED Notes (Signed)
Pt reports widespread pruritic rash x 3-4 days. Denies any new medications, body lotions, detergents, etc. She does work outside in dark clothing.

## 2015-02-11 ENCOUNTER — Ambulatory Visit (INDEPENDENT_AMBULATORY_CARE_PROVIDER_SITE_OTHER): Payer: BLUE CROSS/BLUE SHIELD | Admitting: Family Medicine

## 2015-02-11 ENCOUNTER — Encounter: Payer: Self-pay | Admitting: Family Medicine

## 2015-02-11 VITALS — BP 136/94 | HR 87 | Ht 65.5 in | Wt 197.0 lb

## 2015-02-11 DIAGNOSIS — I10 Essential (primary) hypertension: Secondary | ICD-10-CM

## 2015-02-11 DIAGNOSIS — Z6832 Body mass index (BMI) 32.0-32.9, adult: Secondary | ICD-10-CM | POA: Diagnosis not present

## 2015-02-11 LAB — COMPLETE METABOLIC PANEL WITH GFR
ALT: 13 U/L (ref 0–35)
AST: 15 U/L (ref 0–37)
Albumin: 4.4 g/dL (ref 3.5–5.2)
Alkaline Phosphatase: 77 U/L (ref 39–117)
BUN: 10 mg/dL (ref 6–23)
CO2: 28 mEq/L (ref 19–32)
Calcium: 9.5 mg/dL (ref 8.4–10.5)
Chloride: 103 mEq/L (ref 96–112)
Creat: 0.57 mg/dL (ref 0.50–1.10)
GFR, Est African American: >89 mL/min
GFR, Est Non African American: >89 mL/min
Glucose, Bld: 70 mg/dL (ref 70–99)
Potassium: 4 mEq/L (ref 3.5–5.3)
Sodium: 140 mEq/L (ref 135–145)
Total Bilirubin: 0.4 mg/dL (ref 0.2–1.2)
Total Protein: 7.4 g/dL (ref 6.0–8.3)

## 2015-02-11 LAB — LIPID PANEL
Cholesterol: 232 mg/dL — ABNORMAL HIGH (ref 0–200)
HDL: 54 mg/dL (ref >=46)
LDL Cholesterol: 148 mg/dL — ABNORMAL HIGH (ref 0–99)
Total CHOL/HDL Ratio: 4.3 Ratio
Triglycerides: 150 mg/dL — ABNORMAL HIGH (ref <150)
VLDL: 30 mg/dL (ref 0–40)

## 2015-02-11 LAB — TSH: TSH: 0.907 u[IU]/mL (ref 0.350–4.500)

## 2015-02-11 MED ORDER — HYDROCHLOROTHIAZIDE 25 MG PO TABS: 25.0000 mg | ORAL_TABLET | Freq: Every day | ORAL | Status: DC

## 2015-02-11 NOTE — Patient Instructions (Signed)
[employer]itness Pal  Is a great smartphone app to help you set goal.

## 2015-02-11 NOTE — Progress Notes (Signed)
   Subjective:    Patient ID: Sarah Gonzales, female    DOB: [birth year], 24 y.o.   MRN: [id number]  HPI  patient comes in today with concerns about her blood pressure. She was seen her dentist office recently on July 13 at 2:00 in the afternoon and her blood pressure there was 148/105. A repeat blood pressure was 145/108. She does need some dental work performed and they want to have her cleared and her blood pressure well controlled before they will do her dental work. She does have a very strong family history of high blood pressure in her mother, father, and grandparent.   weight gain-she has gained about 20 pounds in the lst couple of years. She said she has recently decided to get on track and actually bought a fit a couple of weeks ago and is starting to track her exercise and activity levels.   Review of Systems     Objective:   Physical Exam  Constitutional: She is oriented to person, place, and time. She appears well-developed and well-nourished.  HENT:  Head: Normocephalic and atraumatic.  Cardiovascular: Normal rate, regular rhythm and normal heart sounds.   Pulmonary/Chest: Effort normal and breath sounds normal.  Neurological: She is alert and oriented to person, place, and time.  Skin: Skin is warm and dry.  Psychiatric: She has a normal mood and affect. Her behavior is normal.          Assessment & Plan:   hypertension- new diagnosis, based on 2 separate readings on 2 separate days she does meet the definition for high blood pressure. We discussed this today and also discussed treatment options. She has also gained weight over the last couple of years and her BMI is now 37. This is likely contributing to her elevated blood pressure levels.   New diagnosis of high blood pressure then recommend check her thyroid, CMP and fasting lipid panel.  BMI 32 - she feels she eat healthy. Discussed working on diet and exercise.  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  Discussed working on food choices and trying to get healthy. I think if she could get her weight back down it would make a big difference in her blood pressure.

## 2015-02-18 ENCOUNTER — Ambulatory Visit (INDEPENDENT_AMBULATORY_CARE_PROVIDER_SITE_OTHER): Payer: BLUE CROSS/BLUE SHIELD | Admitting: Family Medicine

## 2015-02-18 VITALS — BP 137/89 | HR 104 | Wt 198.0 lb

## 2015-02-18 DIAGNOSIS — I10 Essential (primary) hypertension: Secondary | ICD-10-CM | POA: Diagnosis not present

## 2015-02-18 MED ORDER — LISINOPRIL 10 MG PO TABS: 10.0000 mg | ORAL_TABLET | Freq: Every day | ORAL | Status: DC

## 2015-02-18 NOTE — Progress Notes (Signed)
   Subjective:    Patient ID: Sarah Gonzales, female    DOB: 30-Jul-1991, 24 y.o.   MRN: [id number]  HPI    Review of Systems     Objective:   Physical Exam        Assessment & Plan:  Blood pressure is better but still very borderline. I would like to add lisinopril to the HCTZ. [facility] filled a 90 day supply for the HCTZ we will send over 90 day supply for the lisinopril 10 mg to take once a day in the morning with the HCTZ. At some point we can add them together for a combination pill. I like to see her back in the office in about 8 weeks with me to check blood pressure.  Nani Gasser, MD

## 2015-02-18 NOTE — Progress Notes (Signed)
   Subjective:    Patient ID: Sarah Gonzales, female    DOB: 08-15-1990, 24 y.o.   MRN: [id number]  HPI  Sarah Gonzales is here for blood pressure check. She is taking hydrochlorothiazide daily. Denies shortness of breath, chest pain, headaches or dizziness.   Review of Systems     Objective:   Physical Exam        Assessment & Plan:  Hypertension - blood pressure reading 137/89

## 2015-02-25 NOTE — Progress Notes (Signed)
Left message for a return call

## 2015-03-02 NOTE — Progress Notes (Signed)
Left detailed message.   

## 2015-04-14 NOTE — Progress Notes (Signed)
Mailed letter °

## 2015-05-06 ENCOUNTER — Ambulatory Visit: Payer: Self-pay | Admitting: Family Medicine

## 2015-05-09 ENCOUNTER — Ambulatory Visit (INDEPENDENT_AMBULATORY_CARE_PROVIDER_SITE_OTHER): Payer: BLUE CROSS/BLUE SHIELD | Admitting: Family Medicine

## 2015-05-09 ENCOUNTER — Encounter: Payer: Self-pay | Admitting: Family Medicine

## 2015-05-09 VITALS — BP 136/75 | HR 102 | Wt 197.0 lb

## 2015-05-09 DIAGNOSIS — R030 Elevated blood-pressure reading, without diagnosis of hypertension: Secondary | ICD-10-CM

## 2015-05-09 DIAGNOSIS — Z3009 Encounter for other general counseling and advice on contraception: Secondary | ICD-10-CM

## 2015-05-09 DIAGNOSIS — R000 Tachycardia, unspecified: Secondary | ICD-10-CM

## 2015-05-09 MED ORDER — ETONOGESTREL-ETHINYL ESTRADIOL 0.12-0.015 MG/24HR VA RING: VAGINAL_RING | VAGINAL | Status: DC

## 2015-05-09 NOTE — Progress Notes (Signed)
   Subjective:    Patient ID: Sarah Gonzales, female    DOB: 1991-06-19, 24 y.o.   MRN: [id number]  HPI   Has been tracking her HR on her fit bit the last couple of months.  Stared noticing her pulse running higher with her FitBit.   Hypertension- Pt denies chest pain, SOB, dizziness, or heart palpitations.  Taking meds as directed w/o problems.  Denies medication side effects.  Says stopped her lisinopril and HCTZ bc of elevated HR.  She has been drinking some energy drinks but not regularly.    Needs refill on birth control. We don't have an pap smear on record.  She was seen a gynecologist in [place] when she was living there. And says that her last Pap smear was about a year ago and that was her second one. She broke up with her boyfriend about 8 months ago and has not had any sexual partner.    Review of Systems     Objective:   Physical Exam  Constitutional: She is oriented to person, place, and time. She appears well-developed and well-nourished.  HENT:  Head: Normocephalic and atraumatic.  Cardiovascular: Normal rate, regular rhythm and normal heart sounds.   Pulmonary/Chest: Effort normal and breath sounds normal.  Neurological: She is alert and oriented to person, place, and time.  Skin: Skin is warm and dry.  Psychiatric: She has a normal mood and affect. Her behavior is normal.          Assessment & Plan:  HTN - we discussed trying the lisinopril. She never actually tried the prescription but she did pick it up. She can try and see if she feels well on it. We'll recheck a BMP at her follow-up appointment. 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 She can also use my chart if she would like to send [name] messages.  Tachycardia-gave reassurance. Looking back she has had some elevated pulses going back to 2009. We discussed avoiding triggers such as caffeine mild dehydration etc. that  can all call tachycardia.  Contraceptive counseling - refill her birth control. Encouraged her to check at home to see the exact date of her last Pap smear and to let us know and can help keep track of when she's due again.

## 2015-08-10 ENCOUNTER — Encounter: Payer: Self-pay | Admitting: Family Medicine

## 2015-08-10 ENCOUNTER — Ambulatory Visit (INDEPENDENT_AMBULATORY_CARE_PROVIDER_SITE_OTHER): Payer: BLUE CROSS/BLUE SHIELD | Admitting: Family Medicine

## 2015-08-10 VITALS — BP 121/75 | HR 60 | Wt 209.0 lb

## 2015-08-10 DIAGNOSIS — I10 Essential (primary) hypertension: Secondary | ICD-10-CM

## 2015-08-10 DIAGNOSIS — Z30430 Encounter for insertion of intrauterine contraceptive device: Secondary | ICD-10-CM

## 2015-08-10 DIAGNOSIS — K589 Irritable bowel syndrome without diarrhea: Secondary | ICD-10-CM

## 2015-08-10 DIAGNOSIS — Z3009 Encounter for other general counseling and advice on contraception: Secondary | ICD-10-CM

## 2015-08-10 MED ORDER — CHOLESTYRAMINE LIGHT 4 G PO PACK: 4.0000 g | PACK | Freq: Two times a day (BID) | ORAL | Status: DC

## 2015-08-10 NOTE — Progress Notes (Signed)
   Subjective:    Patient ID: Sarah Gonzales, female    DOB: 08/11/1990, 25 y.o.   MRN: [id number]  HPI She has continue to have stomach problems. She has diarrhea all the time.  Saw GI about a year ago in [place].  She had a colonoscopy about that time.  Had a polyp removed.    She would also like to discuss her birth control.  She's currently using the NuvaRing but says she still worried about getting pregnant. She would actually like to discuss possibly doing the IUD. She is with her current boyfriend. They plan on getting married in the future but she doesn't want to get pregnant and wants to be reassured by that. She feels like her anxiety sometimes becomes problematic if she is a day or 2 late for her menstrual cycle.  Hypertension- Pt denies chest pain, SOB, dizziness, or heart palpitations.  Taking meds as directed w/o problems.  Denies medication side effects.  She is off HCTZ but is on lisinopril.      Review of Systems     Objective:   Physical Exam  Constitutional: She is oriented to person, place, and time. She appears well-developed and well-nourished.  HENT:  Head: Normocephalic and atraumatic.  Cardiovascular: Normal rate, regular rhythm and normal heart sounds.   Pulmonary/Chest: Effort normal and breath sounds normal.  Abdominal: Soft. Bowel sounds are normal. She exhibits no distension and no mass. There is no tenderness. There is no rebound and no guarding.  Neurological: She is alert and oriented to person, place, and time.  Skin: Skin is warm and dry.  Psychiatric: She has a normal mood and affect. Her behavior is normal.          Assessment & Plan:  IBS diarrhea predominant.  We discussed several options. She doesn't get a lot of severe or intense cramping with bowel movements I think she'll benefit a great candidate for a bile acid sequestrant like Cholestyramin or colestipol. Follow-up in 6 weeks to see how she's doing with this.  Contraceptive  counseling-she would be a great candidate for an IUD. Discussed pros and cons and potential side effects. This can be inserted at her GYN office down the hall. I will place referral Center getting contact with her and get her scheduled.  Hypertension-well controlled on current regimen of placental 10 mg. Follow-up in 6 months.

## 2015-08-25 ENCOUNTER — Encounter: Payer: Self-pay | Admitting: Obstetrics & Gynecology

## 2015-08-25 ENCOUNTER — Ambulatory Visit (INDEPENDENT_AMBULATORY_CARE_PROVIDER_SITE_OTHER): Payer: BLUE CROSS/BLUE SHIELD | Admitting: Obstetrics & Gynecology

## 2015-08-25 VITALS — BP 129/89 | HR 102 | Resp 16 | Ht 65.5 in | Wt 203.0 lb

## 2015-08-25 DIAGNOSIS — Z01812 Encounter for preprocedural laboratory examination: Secondary | ICD-10-CM

## 2015-08-25 DIAGNOSIS — I10 Essential (primary) hypertension: Secondary | ICD-10-CM | POA: Insufficient documentation

## 2015-08-25 DIAGNOSIS — Z30430 Encounter for insertion of intrauterine contraceptive device: Secondary | ICD-10-CM | POA: Diagnosis not present

## 2015-08-25 LAB — POCT URINE PREGNANCY: Preg Test, Ur: NEGATIVE

## 2015-08-25 MED ORDER — LEVONORGESTREL 20 MCG/24HR IU IUD
INTRAUTERINE_SYSTEM | Freq: Once | INTRAUTERINE | Status: AC
  Administered 2015-08-25: 16:00:00 via INTRAUTERINE

## 2015-08-25 NOTE — Progress Notes (Signed)
   Subjective:    Patient ID: Sarah Gonzales, female    DOB: [birth year], 25 y.o.   MRN: [id number]  HPI  Pt presents for initiation of Mirena IUD.  She is sexually active.  She has been using the nUva ring and took it out Monday.  She is on her menses.  She denies history of STDs or gyn issues.  Pt has never been pregnant  Review of Systems  Constitutional: Negative.   Respiratory: Negative.   Cardiovascular: Negative.   Gastrointestinal: Positive for diarrhea.  Genitourinary: Negative for menstrual problem and pelvic pain.  Psychiatric/Behavioral: Positive for agitation.       Objective:   Physical Exam  Constitutional: She is oriented to person, place, and time. She appears well-developed and well-nourished. No distress.  HENT:  Head: Normocephalic and atraumatic.  Eyes: Conjunctivae are normal.  Pulmonary/Chest: Effort normal.  Abdominal: Soft. She exhibits no distension and no mass. There is no tenderness. There is no rebound and no guarding.  Genitourinary: Vagina normal and uterus normal.  Uterus anteverted, mobile, non tender  Musculoskeletal: Normal range of motion. She exhibits no edema.  Neurological: She is alert and oriented to person, place, and time.  Skin: Skin is warm and dry.  Psychiatric: She has a normal mood and affect.  Nervous about IUD insertion  Vitals reviewed.     Assessment & Plan:  25 yo female who present for evaluation for MIRENA.  Pt is good candidate. Culures today Neg UPT  IUD Procedure Note Patient identified, informed consent performed.  Discussed risks of irregular bleeding, cramping, infection, malpositioning or misplacement of the IUD outside the uterus which may require further procedures. Time out was performed.  Urine pregnancy test negative.  Speculum placed in the vagina.  Cervix visualized.  Cleaned with Betadine x 2.  Grasped anteriorly with a single tooth tenaculum.  Uterus sounded to 6.5 cm.  Mirena IUD placed per manufacturer's  recommendations.  Strings trimmed to 3 cm. Tenaculum was removed, good hemostasis noted.  Patient tolerated procedure well.   Patient was given post-procedure instructions and the Mirena care card with expiration date.  Patient was also asked to check IUD strings periodically and follow up in 4-6 weeks for IUD check.

## 2015-08-25 NOTE — Addendum Note (Signed)
Addended by: Granville Lewis on: 08/25/2015 03:49 PM   Modules accepted: Orders

## 2015-08-26 LAB — GC/CHLAMYDIA PROBE AMP
CT Probe RNA: NOT DETECTED
GC Probe RNA: NOT DETECTED

## 2015-09-21 ENCOUNTER — Ambulatory Visit (INDEPENDENT_AMBULATORY_CARE_PROVIDER_SITE_OTHER): Payer: BLUE CROSS/BLUE SHIELD | Admitting: Family Medicine

## 2015-09-21 ENCOUNTER — Encounter: Payer: Self-pay | Admitting: Family Medicine

## 2015-09-21 VITALS — BP 124/71 | HR 88 | Ht 65.5 in | Wt 204.0 lb

## 2015-09-21 DIAGNOSIS — R51 Headache: Secondary | ICD-10-CM | POA: Diagnosis not present

## 2015-09-21 DIAGNOSIS — K589 Irritable bowel syndrome without diarrhea: Secondary | ICD-10-CM | POA: Diagnosis not present

## 2015-09-21 DIAGNOSIS — R0683 Snoring: Secondary | ICD-10-CM | POA: Diagnosis not present

## 2015-09-21 DIAGNOSIS — R400 Somnolence: Secondary | ICD-10-CM

## 2015-09-21 DIAGNOSIS — G4710 Hypersomnia, unspecified: Secondary | ICD-10-CM | POA: Diagnosis not present

## 2015-09-21 DIAGNOSIS — R519 Headache, unspecified: Secondary | ICD-10-CM

## 2015-09-21 NOTE — Progress Notes (Signed)
Subjective:    Patient ID: Sarah Gonzales, female    DOB: [birth year], 25 y.o.   MRN: [id number]  HPI F/U IBS - she is doing well with the cholestyramine.  Say in fact it maybe caused some constipation so she has backed off to once a day.  Has really helped with her cramping.  Not straining. Doesn't have BM daily.   Had her IUD placed last month.  He said she had some cramping initially but it's been gradually getting better.  Snoring - she is also concerned about the possibility of sleep apnea. She said her boyfriend and her family members have encouraged her to get checked out because she is a loud snorer. Her father has a diagnosis of sleep apnea. Her boyfriend says she's actually quit breathing. She occasionally does wake up with headaches and feels fatigued even after sleeping for 12 hours.   Review of Systems  BP 124/71 mmHg  Pulse 88  Ht 5' 5.5" (1.664 m)  Wt 204 lb (92.534 kg)  BMI 33.42 kg/m2  SpO2 100%  LMP 08/25/2015    No Known Allergies  Past Medical History  Diagnosis Date  . Warts   . Chronic headaches     Since the age of 70, severe in the past.  . Anxiety   . Bartholin cyst     No past surgical history on file.  Social History   Social History  . Marital Status: Single    Spouse Name: N/A  . Number of Children: N/A  . Years of Education: N/A   Occupational History  . server    Social History Main Topics  . Smoking status: Never Smoker   . Smokeless tobacco: Never Used  . Alcohol Use: 1.2 oz/week    2 Standard drinks or equivalent per week     Comment: socially  . Drug Use: No  . Sexual Activity:    Partners: Male     Comment: lives with family, student at [employer], involved in drama   Other Topics Concern  . Not on file   Social History Narrative    Family History  Problem Relation Age of Onset  . Hypertension Father   . Asthma      aunt  . Bipolar disorder Maternal Aunt   . Hypertension Mother   . Hypertension Maternal Grandfather   .  Lung cancer Maternal Grandfather   . Hypertension Maternal Grandmother   . Hypertension Paternal Grandfather   . Hypertension Paternal Grandmother   . Sleep apnea Father     Outpatient Encounter Prescriptions as of 09/21/2015  Medication Sig  . cholestyramine light (PREVALITE) 4 g packet Take 1 packet (4 g total) by mouth 2 (two) times daily.  Marland Kitchen etonogestrel-ethinyl estradiol (NUVARING) 0.12-0.015 MG/24HR vaginal ring Insert vaginally and leave in place for 3 consecutive weeks, then remove for 1 week.  Marland Kitchen lisinopril (PRINIVIL,ZESTRIL) 10 MG tablet Take 1 tablet (10 mg total) by mouth daily.  Marland Kitchen PARoxetine (PAXIL) 20 MG tablet Take 1 tablet (20 mg total) by mouth every morning.   No facility-administered encounter medications on file as of 09/21/2015.          Objective:   Physical Exam  Constitutional: She is oriented to person, place, and time. She appears well-developed and well-nourished.  HENT:  Head: Normocephalic and atraumatic.  Cardiovascular: Normal rate, regular rhythm and normal heart sounds.   Pulmonary/Chest: Effort normal and breath sounds normal.  Abdominal: Soft. Bowel sounds are normal. She exhibits no  distension and no mass. There is no tenderness. There is no rebound and no guarding.  Neurological: She is alert and oriented to person, place, and time.  Skin: Skin is warm and dry.  Psychiatric: She has a normal mood and affect. Her behavior is normal.          Assessment & Plan:  IBS - he is doing great on the cholestyramine once a day packets. Will continue current regimen. If she feels that the stools are getting to firm she can always get a day or even consider every other day dosing but right now this is really helped her with regularity as well as decreasing the spasms and cramping. Continue to work on healthy diet and staying hydrated to improve her regularity of stooling as well.  Snoring/AM HA/daytime somnolence-positive screening with the stop bang  questionnaire. Recommend referral for sleep study. Patient agrees to move forward with further evaluation.

## 2015-10-10 ENCOUNTER — Ambulatory Visit: Payer: Self-pay | Admitting: Obstetrics & Gynecology

## 2015-11-11 ENCOUNTER — Other Ambulatory Visit: Payer: Self-pay | Admitting: Family Medicine

## 2015-11-15 ENCOUNTER — Telehealth: Payer: Self-pay | Admitting: Family Medicine

## 2015-11-15 NOTE — Telephone Encounter (Signed)
I called pt and left her a message to schedule a F/u on her BP with Dr.Metheney

## 2015-11-28 ENCOUNTER — Ambulatory Visit (HOSPITAL_BASED_OUTPATIENT_CLINIC_OR_DEPARTMENT_OTHER): Payer: BLUE CROSS/BLUE SHIELD | Attending: Family Medicine | Admitting: Internal Medicine

## 2015-11-28 VITALS — Ht 65.0 in | Wt 200.0 lb

## 2015-11-28 DIAGNOSIS — R0683 Snoring: Secondary | ICD-10-CM

## 2015-11-28 DIAGNOSIS — R51 Headache: Secondary | ICD-10-CM | POA: Insufficient documentation

## 2015-11-28 DIAGNOSIS — G4733 Obstructive sleep apnea (adult) (pediatric): Secondary | ICD-10-CM | POA: Diagnosis not present

## 2015-11-28 DIAGNOSIS — R519 Headache, unspecified: Secondary | ICD-10-CM

## 2015-11-28 DIAGNOSIS — R400 Somnolence: Secondary | ICD-10-CM

## 2015-11-28 DIAGNOSIS — G4719 Other hypersomnia: Secondary | ICD-10-CM | POA: Diagnosis not present

## 2015-12-03 DIAGNOSIS — R51 Headache: Secondary | ICD-10-CM

## 2015-12-03 DIAGNOSIS — G4710 Hypersomnia, unspecified: Secondary | ICD-10-CM

## 2015-12-03 DIAGNOSIS — R0683 Snoring: Secondary | ICD-10-CM | POA: Diagnosis not present

## 2015-12-03 NOTE — Procedures (Signed)
   Patient Name: Sarah Gonzales, Sarah Gonzales Date: 11/28/2015 Gender: Female D.O.B: 1991/06/27 Age (years): 25 Referring Provider: Nani Gasseratherine Metheney Height (inches): 65 Interpreting Physician: Jetty Duhamellinton Mckinzey Entwistle MD, ABSM Weight (lbs): 200 RPSGT: Armen PickupFord, Evelyn BMI: 33 MRN: [id number] Neck Size: 15.00 CLINICAL INFORMATION Sleep Gonzales Type: NPSG Indication for sleep Gonzales: Excessive Daytime Sleepiness, Morning Headaches, Snoring Epworth Sleepiness Score: 5  SLEEP Gonzales TECHNIQUE As per the AASM Manual for the Scoring of Sleep and Associated Events v2.3 (April 2016) with a hypopnea requiring 4% desaturations. The channels recorded and monitored were frontal, central and occipital EEG, electrooculogram (EOG), submentalis EMG (chin), nasal and oral airflow, thoracic and abdominal wall motion, anterior tibialis EMG, snore microphone, electrocardiogram, and pulse oximetry.  MEDICATIONS Patient's medications include: charted for review Medications self-administered by patient during sleep Gonzales : No sleep medicine administered.  SLEEP ARCHITECTURE The Gonzales was initiated at 9:49:17 PM and ended at 4:40:41 AM. Sleep onset time was 13.5 minutes and the sleep efficiency was 89.2%. The total sleep time was 367.0 minutes. Stage REM latency was 176.5 minutes. The patient spent 3.81% of the night in stage N1 sleep, 63.35% in stage N2 sleep, 18.66% in stage N3 and 14.17% in REM. Alpha intrusion was absent. Supine sleep was 65.80% Wake after sleep onset 30 minutes  RESPIRATORY PARAMETERS The overall apnea/hypopnea index (AHI) was 5.4 per hour. There were 8 total apneas, including 8 obstructive, 0 central and 0 mixed apneas. There were 25 hypopneas and 74 RERAs. The AHI during Stage REM sleep was 3.5 per hour. AHI while supine was 7.2 per hour. The mean oxygen saturation was 96.39%. The minimum SpO2 during sleep was 89.00%. Loud snoring was noted during this Gonzales.  CARDIAC DATA The 2 lead EKG  demonstrated sinus rhythm. The mean heart rate was 79.64 beats per minute. Other EKG findings include: None.  LEG MOVEMENT DATA The total PLMS were 0 with a resulting PLMS index of 0.00. Associated arousal with leg movement index was 0.0 .  IMPRESSIONS - Mild obstructive sleep apnea occurred during this Gonzales (AHI = 5.4/h). - There were not enough events to meet split CPAP titration protocol - No significant central sleep apnea occurred during this Gonzales (CAI = 0.0/h). - The patient had minimal or no oxygen desaturation during the Gonzales (Min O2 = 89.00%) - The patient snored with Loud snoring volume. - No cardiac abnormalities were noted during this Gonzales. - Clinically significant periodic limb movements did not occur during sleep. No significant associated arousals.  DIAGNOSIS - Obstructive Sleep Apnea (327.23 [G47.33 ICD-10]  RECOMMENDATIONS - Very mild obstructive sleep apnea. Return to discuss treatment options. - Positional therapy avoiding supine position during sleep. -  Avoid alcohol, sedatives and other CNS depressants that may worsen sleep apnea and disrupt normal sleep architecture. - Sleep hygiene should be reviewed to assess factors that may improve sleep quality. - Weight management and regular exercise should be initiated or continued if appropriate.  Sarah Gonzales,Sarah Gonzales D Diplomate, American Board of Sleep Medicine  ELECTRONICALLY SIGNED ON:  12/03/2015, 12:27 PM [facility] PH: [phone]   FX: [phone] ACCREDITED BY THE AMERICAN ACADEMY OF SLEEP MEDICINE

## 2015-12-05 ENCOUNTER — Encounter: Payer: Self-pay | Admitting: Physician Assistant

## 2015-12-05 DIAGNOSIS — G4730 Sleep apnea, unspecified: Secondary | ICD-10-CM | POA: Insufficient documentation

## 2015-12-05 HISTORY — DX: Sleep apnea, unspecified: G47.30

## 2015-12-13 ENCOUNTER — Other Ambulatory Visit: Payer: Self-pay | Admitting: Family Medicine

## 2015-12-14 ENCOUNTER — Encounter: Payer: Self-pay | Admitting: Obstetrics & Gynecology

## 2015-12-14 ENCOUNTER — Ambulatory Visit (INDEPENDENT_AMBULATORY_CARE_PROVIDER_SITE_OTHER): Payer: BLUE CROSS/BLUE SHIELD | Admitting: Obstetrics & Gynecology

## 2015-12-14 VITALS — BP 129/88 | HR 88 | Ht 65.5 in | Wt 214.0 lb

## 2015-12-14 DIAGNOSIS — Z30431 Encounter for routine checking of intrauterine contraceptive device: Secondary | ICD-10-CM | POA: Diagnosis not present

## 2015-12-14 DIAGNOSIS — E669 Obesity, unspecified: Secondary | ICD-10-CM | POA: Insufficient documentation

## 2015-12-14 DIAGNOSIS — Z6841 Body Mass Index (BMI) 40.0 and over, adult: Secondary | ICD-10-CM | POA: Insufficient documentation

## 2015-12-14 NOTE — Progress Notes (Signed)
   Subjective:    Patient ID: Sarah Gonzales, female    DOB: 10/19/1990, 25 y.o.   MRN: [id number]  HPI 25 yo lady here for a string check. She had the Mirena placed 1/17. She has had a relatively small amount of irregular bleeding and cramping. IBU helps. Overall, she is very happy with Mirena. She describes some newish onset "cramping" with BMs. (She is followed for her IBS with Dr. Linford ArnoldMetheney).   Review of Systems She declines Gardasil.    Objective:   Physical Exam [id number] Breathing, conversing, and ambulating normally Abd- obese, benign EG, vagina, cervix- normal Mirena strings visualized, about 2cm out from the os       Assessment & Plan:  String check- doing well RTC 1 year/prn sooner

## 2015-12-20 ENCOUNTER — Ambulatory Visit (INDEPENDENT_AMBULATORY_CARE_PROVIDER_SITE_OTHER): Payer: Self-pay | Admitting: Family Medicine

## 2015-12-20 DIAGNOSIS — G479 Sleep disorder, unspecified: Secondary | ICD-10-CM

## 2015-12-20 NOTE — Progress Notes (Signed)
Patient has been deemed a "no-show" for today's scheduled appointment.  Based on chief complaint and my chart review:  -Follow-up advised.  Contacting patient and urged to schedule visit in 2-3 weeks.  If necessary this was communicated to the patient via phone or mail on: 2:15 PM 12/20/2015

## 2015-12-30 ENCOUNTER — Ambulatory Visit (INDEPENDENT_AMBULATORY_CARE_PROVIDER_SITE_OTHER): Payer: BLUE CROSS/BLUE SHIELD | Admitting: Family Medicine

## 2015-12-30 ENCOUNTER — Encounter: Payer: Self-pay | Admitting: Family Medicine

## 2015-12-30 VITALS — BP 132/88 | HR 90 | Wt 219.0 lb

## 2015-12-30 DIAGNOSIS — K589 Irritable bowel syndrome without diarrhea: Secondary | ICD-10-CM | POA: Diagnosis not present

## 2015-12-30 DIAGNOSIS — R030 Elevated blood-pressure reading, without diagnosis of hypertension: Secondary | ICD-10-CM

## 2015-12-30 DIAGNOSIS — G4730 Sleep apnea, unspecified: Secondary | ICD-10-CM

## 2015-12-30 DIAGNOSIS — I10 Essential (primary) hypertension: Secondary | ICD-10-CM | POA: Diagnosis not present

## 2015-12-30 DIAGNOSIS — G4733 Obstructive sleep apnea (adult) (pediatric): Secondary | ICD-10-CM | POA: Diagnosis not present

## 2015-12-30 MED ORDER — RIFAXIMIN 550 MG PO TABS
550.0000 mg | ORAL_TABLET | Freq: Three times a day (TID) | ORAL | Status: DC
Start: 2015-12-30 — End: 2016-03-19

## 2015-12-30 MED ORDER — LISINOPRIL 10 MG PO TABS: 10.0000 mg | ORAL_TABLET | Freq: Every day | ORAL | Status: DC

## 2015-12-30 NOTE — Progress Notes (Signed)
Subjective:    CC:   HPI:  Hypertension- Pt denies chest pain, SOB, dizziness, or heart palpitations.  Taking meds as directed w/o problems.  Denies medication side effects.  Off her lisinopril for one week. Needs RF.   Sleep apnea - Patient came in today to review her sleep study results. She does snore loudly.  Bowel syndrome/diarrhea predominant-the cholestyramine does work well but she is tired of having to mix the powder with the 1 she's every day. She says if she forgets to take it for more than 2 days in a row then she starts having bad diarrhea again. Once a day does control her symptoms. Twice a day tends to cause constipation. She wants another any other alternative treatments out there.  Past medical history, Surgical history, Family history not pertinant except as noted below, Social history, Allergies, and medications have been entered into the medical record, reviewed, and corrections made.   Review of Systems: No fevers, chills, night sweats, weight loss, chest pain, or shortness of breath.   Objective:    General: Well Developed, well nourished, and in no acute distress.  Neuro: Alert and oriented x3, extra-ocular muscles intact, sensation grossly intact.  HEENT: Normocephalic, atraumatic  Skin: Warm and dry, no rashes. Cardiac: Regular rate and rhythm, no murmurs rubs or gallops, no lower extremity edema.  Respiratory: Clear to auscultation bilaterally. Not using accessory muscles, speaking in full sentences.   Impression and Recommendations:   HTN - Well controlled. Continue current regimen. Follow up in 6 months.    IBS-D  - we discussed several options including the newer medications for Viberzi and Xifixan.  The Verita LambXifixan is covered on her insurance. Will take 14 day course. And hopefully this will improve her symptoms were extended period of time.  Sleep apnea, mild-at we have apnea index of 5.4. Went over results today and discussed strategies to reduce sleep apnea  including weight loss, exercise, not sleeping on her back, and avoiding sedatives such as alcohol and sleep aids. Did encourage her to keep an eye on this over her lifetime. Also discussed possibly getting a mouth piece to help reduce snoring. She could certainly try a generic 1 over-the-counter or consider speaking with her dentist about a custom fit 1.

## 2016-01-20 LAB — COMPLETE METABOLIC PANEL WITH GFR
ALT: 14 U/L (ref 6–29)
AST: 18 U/L (ref 10–30)
Albumin: 4.3 g/dL (ref 3.6–5.1)
Alkaline Phosphatase: 76 U/L (ref 33–115)
BUN: 11 mg/dL (ref 7–25)
CO2: 25 mmol/L (ref 20–31)
Calcium: 9.2 mg/dL (ref 8.6–10.2)
Chloride: 101 mmol/L (ref 98–110)
Creat: 0.63 mg/dL (ref 0.50–1.10)
GFR, Est African American: >89 mL/min (ref >=60)
GFR, Est Non African American: >89 mL/min (ref >=60)
Glucose, Bld: 88 mg/dL (ref 65–99)
Potassium: 4 mmol/L (ref 3.5–5.3)
Sodium: 139 mmol/L (ref 135–146)
Total Bilirubin: 0.4 mg/dL (ref 0.2–1.2)
Total Protein: 7 g/dL (ref 6.1–8.1)

## 2016-01-20 LAB — LIPID PANEL
Cholesterol: 200 mg/dL (ref 125–200)
HDL: 41 mg/dL — ABNORMAL LOW (ref >=46)
LDL Cholesterol: 128 mg/dL (ref <130)
Total CHOL/HDL Ratio: 4.9 Ratio (ref <=5.0)
Triglycerides: 154 mg/dL — ABNORMAL HIGH (ref <150)
VLDL: 31 mg/dL — ABNORMAL HIGH (ref <30)

## 2016-03-19 ENCOUNTER — Ambulatory Visit (INDEPENDENT_AMBULATORY_CARE_PROVIDER_SITE_OTHER): Payer: BLUE CROSS/BLUE SHIELD | Admitting: Family Medicine

## 2016-03-19 ENCOUNTER — Encounter: Payer: Self-pay | Admitting: Family Medicine

## 2016-03-19 VITALS — BP 134/82 | HR 68 | Resp 16 | Ht 65.5 in | Wt 220.0 lb

## 2016-03-19 DIAGNOSIS — G43801 Other migraine, not intractable, with status migrainosus: Secondary | ICD-10-CM | POA: Diagnosis not present

## 2016-03-19 DIAGNOSIS — G43901 Migraine, unspecified, not intractable, with status migrainosus: Secondary | ICD-10-CM

## 2016-03-19 MED ORDER — KETOROLAC TROMETHAMINE 60 MG/2ML IM SOLN
60.0000 mg | Freq: Once | INTRAMUSCULAR | Status: AC
  Administered 2016-03-19: 60 mg via INTRAMUSCULAR

## 2016-03-19 MED ORDER — PROMETHAZINE HCL 25 MG/ML IJ SOLN
25.0000 mg | Freq: Once | INTRAMUSCULAR | Status: AC
Start: 2016-03-19 — End: 2016-03-19
  Administered 2016-03-19: 25 mg via INTRAMUSCULAR

## 2016-03-19 NOTE — Progress Notes (Signed)
Subjective:    CC: Migraine HA  HPI: C/O of migraine HA x 2 weeks. Had frequent HA in high school.  She says this headache has been more severe and it has been persistent. She's had some nausea but no vomiting or diarrhea. She has had some light sensitivity. She's been using Excedrin Migraine. She says it does help temporarily to dull the headache but then it seems to come right back. She does not have an aura. She does drink one cup of coffee every morning. No other caffeine intake. She feels like she's been sleeping well. The headache is mostly frontal. Most days it gets worse as the day goes on but she's had a couple days where she actually woke up with it.  Past medical history, Surgical history, Family history not pertinant except as noted below, Social history, Allergies, and medications have been entered into the medical record, reviewed, and corrections made.   Review of Systems: No fevers, chills, night sweats, weight loss, chest pain, or shortness of breath.   Objective:    General: Well Developed, well nourished, and in no acute distress.  Neuro: Alert and oriented x3, extra-ocular muscles intact, sensation grossly intact.  HEENT: Normocephalic, atraumatic  Skin: Warm and dry, no rashes. Cardiac: Regular rate and rhythm, no murmurs rubs or gallops, no lower extremity edema.  Respiratory: Clear to auscultation bilaterally. Not using accessory muscles, speaking in full sentences.   Impression and Recommendations:   Status migrainous-will treat with Toradol injection IM today. If not improving by tomorrow please call and we'll do steroid taper over 5 days. Sitter prophylaxis in the future if she starts having more frequent migraines. We did review some dietary recommendations including avoiding caffeine, H she says, wine etc. Also reviewed some lifestyle recommendations to reduce frequency of migraines. I'll up as needed.

## 2016-03-19 NOTE — Addendum Note (Signed)
Addended by: Deno EtienneBARKLEY, Brion Hedges L on: 03/19/2016 03:24 PM   Modules accepted: Orders

## 2016-03-19 NOTE — Patient Instructions (Addendum)
Recurrent Migraine Headache A migraine headache is an intense, throbbing pain on one or both sides of your head. Recurrent migraines keep coming back. A migraine can last for 30 minutes to several hours. CAUSES  The exact cause of a migraine headache is not always known. However, a migraine may be caused when nerves in the brain become irritated and release chemicals that cause inflammation. This causes pain. Certain things may also trigger migraines, such as:   Alcohol.  Smoking.  Stress.  Menstruation.  Aged cheeses.  Foods or drinks that contain nitrates, glutamate, aspartame, or tyramine.  Lack of sleep.  Chocolate.  Caffeine.  Hunger.  Physical exertion.  Fatigue.  Medicines used to treat chest pain (nitroglycerine), birth control pills, estrogen, and some blood pressure medicines. SYMPTOMS   Pain on one or both sides of your head.  Pulsating or throbbing pain.  Severe pain that prevents daily activities.  Pain that is aggravated by any physical activity.  Nausea, vomiting, or both.  Dizziness.  Pain with exposure to bright lights, loud noises, or activity.  General sensitivity to bright lights, loud noises, or smells. Before you get a migraine, you may get warning signs that a migraine is coming (aura). An aura may include:  Seeing flashing lights.  Seeing bright spots, halos, or zigzag lines.  Having tunnel vision or blurred vision.  Having feelings of numbness or tingling.  Having trouble talking.  Having muscle weakness. DIAGNOSIS  A recurrent migraine headache is often diagnosed based on:  Symptoms.  Physical examination.  A CT scan or MRI of your head. These imaging tests cannot diagnose migraines but can help rule out other causes of headaches.  TREATMENT  Medicines may be given for pain and nausea. Medicines can also be given to help prevent recurrent migraines. HOME CARE INSTRUCTIONS  Only take over-the-counter or prescription  medicines for pain or discomfort as directed by your health care provider. The use of long-term narcotics is not recommended.  Lie down in a dark, quiet room when you have a migraine.  Keep a journal to find out what may trigger your migraine headaches. For example, write down:  What you eat and drink.  How much sleep you get.  Any change to your diet or medicines.  Limit alcohol consumption.  Quit smoking if you smoke.  Get 7-9 hours of sleep, or as recommended by your health care provider.  Limit stress.  Keep lights dim if bright lights bother you and make your migraines worse. SEEK MEDICAL CARE IF:   You do not get relief from the medicines given to you.  You have a recurrence of pain.  You have a fever. SEEK IMMEDIATE MEDICAL CARE IF:  Your migraine becomes severe.  You have a stiff neck.  You have loss of vision.  You have muscular weakness or loss of muscle control.  You start losing your balance or have trouble walking.  You feel faint or pass out.  You have severe symptoms that are different from your first symptoms. MAKE SURE YOU:   Understand these instructions.  Will watch your condition.  Will get help right away if you are not doing well or get worse.   This information is not intended to replace advice given to you by your health care provider. Make sure you discuss any questions you have with your health care provider.   Document Released: 04/10/2001 Document Revised: 08/06/2014 Document Reviewed: 03/23/2013 Elsevier Interactive Patient Education 2016 Elsevier Inc.  

## 2016-08-02 ENCOUNTER — Encounter: Payer: Self-pay | Admitting: Obstetrics & Gynecology

## 2016-08-02 ENCOUNTER — Ambulatory Visit (INDEPENDENT_AMBULATORY_CARE_PROVIDER_SITE_OTHER): Payer: BLUE CROSS/BLUE SHIELD | Admitting: Obstetrics & Gynecology

## 2016-08-02 ENCOUNTER — Ambulatory Visit (INDEPENDENT_AMBULATORY_CARE_PROVIDER_SITE_OTHER): Payer: BLUE CROSS/BLUE SHIELD | Admitting: Family Medicine

## 2016-08-02 ENCOUNTER — Encounter: Payer: Self-pay | Admitting: Family Medicine

## 2016-08-02 VITALS — BP 129/88 | HR 99 | Ht 64.0 in | Wt 217.0 lb

## 2016-08-02 VITALS — BP 129/88 | HR 99 | Ht 66.0 in | Wt 216.0 lb

## 2016-08-02 DIAGNOSIS — R102 Pelvic and perineal pain: Secondary | ICD-10-CM

## 2016-08-02 DIAGNOSIS — F320 Major depressive disorder, single episode, mild: Secondary | ICD-10-CM

## 2016-08-02 DIAGNOSIS — F329 Major depressive disorder, single episode, unspecified: Secondary | ICD-10-CM | POA: Insufficient documentation

## 2016-08-02 DIAGNOSIS — N898 Other specified noninflammatory disorders of vagina: Secondary | ICD-10-CM

## 2016-08-02 DIAGNOSIS — I10 Essential (primary) hypertension: Secondary | ICD-10-CM

## 2016-08-02 DIAGNOSIS — F32A Depression, unspecified: Secondary | ICD-10-CM | POA: Insufficient documentation

## 2016-08-02 MED ORDER — LISINOPRIL 10 MG PO TABS: 10.0000 mg | ORAL_TABLET | Freq: Every day | ORAL | 1 refills | Status: DC

## 2016-08-02 MED ORDER — PAROXETINE HCL 20 MG PO TABS: 20.0000 mg | ORAL_TABLET | ORAL | 1 refills | Status: DC

## 2016-08-02 MED ORDER — METRONIDAZOLE 500 MG PO TABS: 500.0000 mg | ORAL_TABLET | Freq: Two times a day (BID) | ORAL | 0 refills | Status: DC

## 2016-08-02 NOTE — Progress Notes (Signed)
   Subjective:    Patient ID: Sarah Gonzales, female    DOB: 09/21/1990, 26 y.o.   MRN: [id number]  HPI 26 yo SW lady here with a vaginal odor and random intermittent sharp uterine "cramps" that spontaneously resolve in less than 5 minutes. These issues have been present for about 1-2 weeks. She has not tried any OTC meds/IBU.   Review of Systems  She has been monogamous for about 1 1/2 years.     Objective:   Physical Exam Pleasant obese WF NAD Breathing, conversing, and ambulating normally Vaginal discharge c/w BV IUD strings seen       Assessment & Plan:  Vaginal discharge- treat with flagyl Wet prep sent If pain still present after abx, then get gyn u/s

## 2016-08-02 NOTE — Progress Notes (Signed)
Subjective:    CC: Depression, HTN  HPI: Hypertension- Pt denies chest pain, SOB, dizziness, or heart palpitations.  Taking meds as directed w/o problems.  Denies medication side effects.    F/U depression - currently on Paxil. Though she hasn't been taking it regularly. She has been forgetting it some. Would like to be referred to behavioral health. She's been working a lot of hours over the Christmas holidays but now things have slowed down at work. She really doesn't feel like she has a good support network. She has a boyfriend that she lives with but doesn't really have a lot of friends. She said she no longer keeps in contact with college friends or high school friends. She says the people that she works with our much older so she doesn't feel like she has a lot in common with them. She does enjoy reading but doesn't really do any other outside activities and says she is really not sure what she likes. She says sometimes she feels like she just has to high expectations for those around her and sometimes it keeps her from getting close to people.   Past medical history, Surgical history, Family history not pertinant except as noted below, Social history, Allergies, and medications have been entered into the medical record, reviewed, and corrections made.   Review of Systems: No fevers, chills, night sweats, weight loss, chest pain, or shortness of breath.   Objective:    General: Well Developed, well nourished, and in no acute distress.  Neuro: Alert and oriented x3, extra-ocular muscles intact, sensation grossly intact.  HEENT: Normocephalic, atraumatic  Skin: Warm and dry, no rashes. Cardiac: Regular rate and   rhythm, no murmurs rubs or gallops, no lower extremity edema.  Respiratory: Clear to auscultation bilaterally. Not using accessory muscles, speaking in full sentences.   Impression and Recommendations:    HTN - Well controlled. Continue current regimen. Follow up in 6 months.    Depression - PHQ- 9 score of 16 today, not well controlled.  Sxs are severe.  Will refer her to behavioral health.  I really think she would benefit from counseling and therapy. We also discussed medication. She is okay with restarting her Paxil since it did work well for her in the past. This certainly she could meet with her psychiatrist downstairs at some point if it's not helping and consider changing medications. I do think she needs to work on some issues for why she tends avoid people.  Encouraged her to get involved with some type of group outside of work that might have similar interests. This might help her reach out to others around her.  Time spent 25 min, > 50 % spent counseling about depression and HTN.

## 2016-08-03 LAB — BASIC METABOLIC PANEL WITH GFR
BUN: 12 mg/dL (ref 7–25)
CO2: 21 mmol/L (ref 20–31)
Calcium: 9.9 mg/dL (ref 8.6–10.2)
Chloride: 104 mmol/L (ref 98–110)
Creat: 0.77 mg/dL (ref 0.50–1.10)
GFR, Est African American: >89 mL/min (ref >=60)
GFR, Est Non African American: >89 mL/min (ref >=60)
Glucose, Bld: 77 mg/dL (ref 65–99)
Potassium: 4.2 mmol/L (ref 3.5–5.3)
Sodium: 140 mmol/L (ref 135–146)

## 2016-08-03 NOTE — Progress Notes (Signed)
All labs are normal. 

## 2016-08-06 LAB — CERVICOVAGINAL ANCILLARY ONLY
Bacterial vaginitis: NEGATIVE
Candida vaginitis: NEGATIVE

## 2016-08-16 ENCOUNTER — Other Ambulatory Visit: Payer: Self-pay

## 2016-11-22 ENCOUNTER — Ambulatory Visit: Payer: Self-pay | Admitting: Obstetrics & Gynecology

## 2016-12-31 ENCOUNTER — Encounter: Payer: Self-pay | Admitting: Family Medicine

## 2016-12-31 ENCOUNTER — Ambulatory Visit (INDEPENDENT_AMBULATORY_CARE_PROVIDER_SITE_OTHER): Payer: BLUE CROSS/BLUE SHIELD | Admitting: Family Medicine

## 2016-12-31 VITALS — BP 142/79 | HR 90 | Ht 64.57 in | Wt 228.0 lb

## 2016-12-31 DIAGNOSIS — F320 Major depressive disorder, single episode, mild: Secondary | ICD-10-CM | POA: Diagnosis not present

## 2016-12-31 DIAGNOSIS — I10 Essential (primary) hypertension: Secondary | ICD-10-CM

## 2016-12-31 MED ORDER — LISINOPRIL 10 MG PO TABS: 10.0000 mg | ORAL_TABLET | Freq: Every day | ORAL | 1 refills | Status: DC

## 2016-12-31 MED ORDER — FLUOXETINE HCL 20 MG PO TABS: 20.0000 mg | ORAL_TABLET | Freq: Every day | ORAL | 1 refills | Status: DC

## 2016-12-31 NOTE — Progress Notes (Addendum)
Subjective:    Patient ID: Sarah Gonzales, female    DOB: 04/02/1991, 26 y.o.   MRN: [id number]  HPI 26 yo  female comes in today to discuss her mood. She's currently on paroxetine 20 mg daily. I last saw her in January she admits she wasn't taking her medication regularly so encouraged her to start taking it consistently. We placed referral to behavioral health downstairs but she did not go.Unfortunately she is still really struggling with her mood. She feels like her mood swinging up and down. Right now she feels like she is battling more depressive symptoms versus anxiety symptoms. She feels like things have really heightened in the last month. She actually got written up at work recently because she has been missing details and her job. 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 She says she has days where she just doesn't want to get out of bed and now is having relationship problems as well. She feels like she sleeps excessively. She's been on Paxil for well over a year at this point.  Hypertension- Pt denies chest pain, SOB, dizziness, or heart palpitations.  She ran out of her medication and didn't refill it.  Denies medication side effects.     Review of Systems  BP (!) 142/79   Pulse 90   Ht 5' 4.57" (1.64 m)   Wt 228 lb (103.4 kg)   SpO2 99%   BMI 38.45 kg/m     No Known Allergies  Past Medical History:  Diagnosis Date  . Anxiety   . Bartholin cyst   . Chronic headaches    Since the age of 26, severe in the past.  . Warts     No past surgical history on file.  Social History   Social History  . Marital status: Single    Spouse name: N/A  . Number of children: N/A  . Years of education: N/A   Occupational History  . server    Social History Main Topics  . Smoking status: Never Smoker  . Smokeless tobacco: Never Used  . Alcohol use 1.2 oz/week    2 Standard drinks or equivalent per week     Comment: socially  . Drug use: No  . Sexual activity: Yes     Partners: Male     Comment: lives with family, [employer], involved in drama   Other Topics Concern  . Not on file   Social History Narrative  . No narrative on file    Family History  Problem Relation Age of Onset  . Hypertension Mother   . Hypertension Father   . Sleep apnea Father   . Asthma Unknown        aunt  . Bipolar disorder Maternal Aunt   . Hypertension Maternal Grandfather   . Lung cancer Maternal Grandfather   . Hypertension Maternal Grandmother   . Hypertension Paternal Grandfather   . Hypertension Paternal Grandmother     Outpatient Encounter Prescriptions as of 12/31/2016  Medication Sig  . [DISCONTINUED] PARoxetine (PAXIL) 20 MG tablet Take 1 tablet (20 mg total) by mouth every morning.  Marland Kitchen. FLUoxetine (PROZAC) 20 MG tablet Take 1 tablet (20 mg total) by mouth daily.  Marland Kitchen. lisinopril (PRINIVIL,ZESTRIL) 10 MG tablet Take 1 tablet (10 mg total) by mouth daily.  . [DISCONTINUED] cholestyramine light (PREVALITE) 4 g packet Take 1 packet (4 g total) by mouth 2 (two) times daily.  . [DISCONTINUED] lisinopril (PRINIVIL,ZESTRIL) 10 MG tablet Take 1 tablet (  10 mg total) by mouth daily.  . [DISCONTINUED] metroNIDAZOLE (FLAGYL) 500 MG tablet Take 1 tablet (500 mg total) by mouth 2 (two) times daily.   No facility-administered encounter medications on file as of 12/31/2016.          Objective:   Physical Exam  Constitutional: She is oriented to person, place, and time. She appears well-developed and well-nourished.  HENT:  Head: Normocephalic and atraumatic.  Cardiovascular: Normal rate, regular rhythm and normal heart sounds.   Pulmonary/Chest: Effort normal and breath sounds normal.  Neurological: She is alert and oriented to person, place, and time.  Skin: Skin is warm and dry.  Psychiatric: She has a normal mood and affect. Her behavior is normal.        Assessment & Plan:  HTN - Uncontrolled. Restart her medication. Follow-up in one month.  Depression -  Uncontrolled symptoms at this time. PHQ 9 score of 25 and got 7 score of 17. Discussed options. We could try increasing her Paxil versus switching medications. Opted to try switching to fluoxetine. + Noticed that she's had some significant weight weight gain that's been very gradual over the last year or more which I think could be secondary to the Paxil. Also encouraged her to contact her employee assistance program through work as a often provide some type of free counseling for a limited number of sessions. After that I'll be happy to refer her for more formal therapy or counseling here locally. Encouraged her to make sure that she is eating healthy, getting adequate sleep and exercising.

## 2016-12-31 NOTE — Patient Instructions (Addendum)
Cut the paxil in half. Take a half tab daily x 1 week, then stop and start the fluoxetine the next day.

## 2017-01-02 LAB — COMPLETE METABOLIC PANEL WITH GFR
ALT: 15 U/L (ref 6–29)
AST: 17 U/L (ref 10–30)
Albumin: 4.4 g/dL (ref 3.6–5.1)
Alkaline Phosphatase: 77 U/L (ref 33–115)
BUN: 8 mg/dL (ref 7–25)
CO2: 22 mmol/L (ref 20–31)
Calcium: 9.7 mg/dL (ref 8.6–10.2)
Chloride: 103 mmol/L (ref 98–110)
Creat: 0.71 mg/dL (ref 0.50–1.10)
GFR, Est African American: >89 mL/min (ref >=60)
GFR, Est Non African American: >89 mL/min (ref >=60)
Glucose, Bld: 85 mg/dL (ref 65–99)
Potassium: 4.1 mmol/L (ref 3.5–5.3)
Sodium: 138 mmol/L (ref 135–146)
Total Bilirubin: 0.4 mg/dL (ref 0.2–1.2)
Total Protein: 7.4 g/dL (ref 6.1–8.1)

## 2017-01-02 LAB — LIPID PANEL
Cholesterol: 182 mg/dL (ref <200)
HDL: 44 mg/dL — ABNORMAL LOW (ref >50)
LDL Cholesterol: 110 mg/dL — ABNORMAL HIGH (ref <100)
Total CHOL/HDL Ratio: 4.1 Ratio (ref <5.0)
Triglycerides: 138 mg/dL (ref <150)
VLDL: 28 mg/dL (ref <30)

## 2017-01-02 NOTE — Progress Notes (Signed)
All labs are normal. 

## 2017-01-03 ENCOUNTER — Telehealth: Payer: Self-pay | Admitting: *Deleted

## 2017-01-03 DIAGNOSIS — F320 Major depressive disorder, single episode, mild: Secondary | ICD-10-CM

## 2017-01-03 NOTE — Telephone Encounter (Signed)
referral

## 2017-01-05 ENCOUNTER — Ambulatory Visit (INDEPENDENT_AMBULATORY_CARE_PROVIDER_SITE_OTHER): Payer: BLUE CROSS/BLUE SHIELD

## 2017-01-05 ENCOUNTER — Ambulatory Visit (HOSPITAL_COMMUNITY)
Admission: EM | Admit: 2017-01-05 | Discharge: 2017-01-05 | Disposition: A | Discharge status: Home / Self Care | Payer: BLUE CROSS/BLUE SHIELD | Attending: Internal Medicine | Admitting: Internal Medicine

## 2017-01-05 ENCOUNTER — Encounter (HOSPITAL_COMMUNITY): Payer: Self-pay | Admitting: *Deleted

## 2017-01-05 DIAGNOSIS — M25512 Pain in left shoulder: Secondary | ICD-10-CM | POA: Diagnosis not present

## 2017-01-05 MED ORDER — MELOXICAM 15 MG PO TABS: 15.0000 mg | ORAL_TABLET | Freq: Every day | ORAL | 0 refills | Status: DC

## 2017-01-05 MED ORDER — CYCLOBENZAPRINE HCL 5 MG PO TABS: 5.0000 mg | ORAL_TABLET | Freq: Three times a day (TID) | ORAL | 0 refills | Status: DC | PRN

## 2017-01-05 NOTE — ED Triage Notes (Signed)
Pain  L    Shoulder     Pt  Is    Worse  On  Movement       Denies   Any  Injury      Pt  Reports      The  Pain  Started  Today    Good  pulsse   Cap  Refill  Is  Brisk

## 2017-01-05 NOTE — Discharge Instructions (Signed)
°  Flexeril (cyclobenzaprine) is a muscle relaxer and may cause drowsiness. Do not drink alcohol, drive, or operate heavy machinery while taking. ° °Meloxicam (Mobic) is an antiinflammatory to help with pain and inflammation.  Do not take ibuprofen, Advil, Aleve, or any other medications that contain NSAIDs while taking meloxicam as this may cause stomach upset or even ulcers if taken in large amounts for an extended period of time.  ° °

## 2017-01-05 NOTE — ED Provider Notes (Signed)
CSN: [id number]     Arrival date & time 01/05/17  1958 History   First MD Initiated Contact with Patient 01/05/17 2012     Chief Complaint  Patient presents with  . Shoulder Pain   (Consider location/radiation/quality/duration/timing/severity/associated sxs/prior Treatment) HPI Sarah Gonzales is a 26 y.o. female presenting to UC with c/o sudden onset, suddenly worsening Left shoulder pain that is aching and sore. Pain is 10/10 with movement. Denies known injury. Pain started this morning after she woke up. She notes she went to work where pain worsened. 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 No heavy lifting. No hx of shoulder pain or surgery before.  Denies radiation of pain or numbness.    Past Medical History:  Diagnosis Date  . Anxiety   . Bartholin cyst   . Chronic headaches    Since the age of 26, severe in the past.  . Warts    History reviewed. No pertinent surgical history. Family History  Problem Relation Age of Onset  . Hypertension Mother   . Hypertension Father   . Sleep apnea Father   . Asthma Unknown        aunt  . Bipolar disorder Maternal Aunt   . Hypertension Maternal Grandfather   . Lung cancer Maternal Grandfather   . Hypertension Maternal Grandmother   . Hypertension Paternal Grandfather   . Hypertension Paternal Grandmother    Social History  Substance Use Topics  . Smoking status: Never Smoker  . Smokeless tobacco: Never Used  . Alcohol use 1.2 oz/week    2 Standard drinks or equivalent per week     Comment: socially   OB History    Gravida Para Term Preterm AB Living   0 0 0 0 0 0   SAB TAB Ectopic Multiple Live Births   0 0 0 0       Review of Systems  Musculoskeletal: Positive for arthralgias and myalgias. Negative for joint swelling, neck pain and neck stiffness.  Skin: Negative for color change and wound.  Neurological: Positive for weakness (Left shoulder due to pain). Negative for numbness.    Allergies  Patient has no  known allergies.  Home Medications   Prior to Admission medications   Medication Sig Start Date End Date Taking? Authorizing Provider  cyclobenzaprine (FLEXERIL) 5 MG tablet Take 1 tablet (5 mg total) by mouth 3 (three) times daily as needed for muscle spasms. 01/05/17   Junius Finner'Malley, Eugenio Dollins, PA-C  FLUoxetine (PROZAC) 20 MG tablet Take 1 tablet (20 mg total) by mouth daily. 12/31/16   Agapito GamesMetheney, Catherine D, MD  lisinopril (PRINIVIL,ZESTRIL) 10 MG tablet Take 1 tablet (10 mg total) by mouth daily. 12/31/16   Agapito GamesMetheney, Catherine D, MD  meloxicam (MOBIC) 15 MG tablet Take 1 tablet (15 mg total) by mouth daily. For 7 days, then daily as needed for pain 01/05/17   Junius Finner'Malley, Drexel Ivey, PA-C   Meds Ordered and Administered this Visit  Medications - No data to display  BP (!) 144/94 (BP Location: Right Arm)   Pulse 78   Temp 98.6 F (37 C) (Oral)   Resp 18   LMP 12/28/2016   SpO2 100%  No data found.   Physical Exam  Constitutional: She is oriented to person, place, and time. She appears well-developed and well-nourished.  HENT:  Head: Normocephalic and atraumatic.  Eyes: EOM are normal.  Neck: Normal range of motion.  Cardiovascular: Normal rate.   Pulses:  Radial pulses are 2+ on the left side.  Pulmonary/Chest: Effort normal.  Musculoskeletal: She exhibits tenderness. She exhibits no edema.  Left shoulder: no deformity. Tenderness over proximal biceps groove. Severely limited abduction due to pain.  Flexion at elbow causes pain in anterior shoulder. Full ROM elbow and wrist. 5/5 grip strength.  Neurological: She is alert and oriented to person, place, and time.  Skin: Skin is warm and dry.  Psychiatric: She has a normal mood and affect. Her behavior is normal.  Nursing note and vitals reviewed.   Urgent Care Course     Procedures (including critical care time)  Labs Review Labs Reviewed - No data to display  Imaging Review Dg Shoulder Left  Result Date: 01/05/2017 CLINICAL DATA:   Superior left shoulder pain since this morning. EXAM: LEFT SHOULDER - 2+ VIEW COMPARISON:  None. FINDINGS: There is no evidence of fracture or dislocation. There is no evidence of arthropathy or other focal bone abnormality. Soft tissues are unremarkable. IMPRESSION: Normal examination. Electronically Signed   By: Beckie Salts M.D.   On: 01/05/2017 20:31      MDM   1. Acute pain of left shoulder   2. Left shoulder pain   3. Left anterior shoulder pain    Hx and exam c/w proximal biceps tendonitis.  Rx: Flexeril and Meloxicam Encouraged alternating cool and warm compresses. Home care instructions with ROM exercises provided F/u with PCP or Sports Medicine in 1-2 weeks if not improving.    Junius Finner, PA-C 01/05/17 2042

## 2017-01-24 ENCOUNTER — Encounter (HOSPITAL_COMMUNITY): Payer: Self-pay | Admitting: Licensed Clinical Social Worker

## 2017-01-24 ENCOUNTER — Ambulatory Visit (INDEPENDENT_AMBULATORY_CARE_PROVIDER_SITE_OTHER): Payer: BLUE CROSS/BLUE SHIELD | Admitting: Licensed Clinical Social Worker

## 2017-01-24 DIAGNOSIS — F331 Major depressive disorder, recurrent, moderate: Secondary | ICD-10-CM

## 2017-01-24 DIAGNOSIS — F411 Generalized anxiety disorder: Secondary | ICD-10-CM | POA: Diagnosis not present
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
## 2017-01-29 ENCOUNTER — Encounter: Payer: Self-pay | Admitting: Family Medicine

## 2017-01-29 ENCOUNTER — Ambulatory Visit (INDEPENDENT_AMBULATORY_CARE_PROVIDER_SITE_OTHER): Payer: BLUE CROSS/BLUE SHIELD | Admitting: Family Medicine

## 2017-01-29 VITALS — BP 133/90 | HR 84 | Resp 18 | Wt 229.0 lb

## 2017-01-29 DIAGNOSIS — I10 Essential (primary) hypertension: Secondary | ICD-10-CM

## 2017-01-29 DIAGNOSIS — K2100 Gastro-esophageal reflux disease with esophagitis, without bleeding: Secondary | ICD-10-CM

## 2017-01-29 DIAGNOSIS — F320 Major depressive disorder, single episode, mild: Secondary | ICD-10-CM

## 2017-01-29 DIAGNOSIS — K580 Irritable bowel syndrome with diarrhea: Secondary | ICD-10-CM | POA: Diagnosis not present

## 2017-01-29 NOTE — Progress Notes (Signed)
Subjective:    Patient ID: Sarah Gonzales, female    DOB: 1990-08-03, 26 y.o.   MRN: [id number]  HPI F/U Depression - last PHQ - 9 score of 25.  We decided to wean her Paxil and switch her to fluoxetine. She was able to go for her first appointment at behavioral health downstairs. She did find it helpful and plans on going every 2 weeks. Since switching to the fluoxetine she has felt more focused and less sleepy during the day. That she's also noticed she is staying up later because she hasn't noticed tired. She has noticed that her IBS has been flaring a little bit more than usual. That she wonders if that could also be from her lisinopril. She actually been off of it for a few months and restarted it recently as well. She has been expressing more heartburn and GERD. She's been getting a burning sensation in her upper chest after eating. It then makes her feel a little nauseated as well. No alleviating factors. She said she even tried Tums and it really wasn't helpful. It's definitely triggered by eating though.   Review of Systems   BP 133/90   Pulse 84   Resp 18   Wt 229 lb (103.9 kg)   BMI 38.62 kg/m     No Known Allergies  Past Medical History:  Diagnosis Date  . Anxiety   . Bartholin cyst   . Chronic headaches    Since the age of 32, severe in the past.  . Warts     No past surgical history on file.  Social History   Social History  . Marital status: Single    Spouse name: N/A  . Number of children: N/A  . Years of education: N/A   Occupational History  . server    Social History Main Topics  . Smoking status: Never Smoker  . Smokeless tobacco: Never Used  . Alcohol use 1.2 oz/week    2 Standard drinks or equivalent per week     Comment: socially  . Drug use: No  . Sexual activity: Yes    Partners: Male     Comment: lives with family, student at [employer], involved in drama   Other Topics Concern  . Not on file   Social History Narrative  . No narrative on file     Family History  Problem Relation Age of Onset  . Hypertension Mother   . Hypertension Father   . Sleep apnea Father   . Asthma Unknown        aunt  . Bipolar disorder Maternal Aunt   . Hypertension Maternal Grandfather   . Lung cancer Maternal Grandfather   . Hypertension Maternal Grandmother   . Hypertension Paternal Grandfather   . Hypertension Paternal Grandmother     Outpatient Encounter Prescriptions as of 01/29/2017  Medication Sig  . FLUoxetine (PROZAC) 20 MG tablet Take 1 tablet (20 mg total) by mouth daily.  Marland Kitchen lisinopril (PRINIVIL,ZESTRIL) 10 MG tablet Take 1 tablet (10 mg total) by mouth daily.  . [DISCONTINUED] cyclobenzaprine (FLEXERIL) 5 MG tablet Take 1 tablet (5 mg total) by mouth 3 (three) times daily as needed for muscle spasms. (Patient not taking: Reported on 01/24/2017)  . [DISCONTINUED] meloxicam (MOBIC) 15 MG tablet Take 1 tablet (15 mg total) by mouth daily. For 7 days, then daily as needed for pain (Patient not taking: Reported on 01/24/2017)   No facility-administered encounter medications on file as of 01/29/2017.  Objective:   Physical Exam  Constitutional: She is oriented to person, place, and time. She appears well-developed and well-nourished.  HENT:  Head: Normocephalic and atraumatic.  Cardiovascular: Normal rate, regular rhythm and normal heart sounds.   Pulmonary/Chest: Effort normal and breath sounds normal.  Neurological: She is alert and oriented to person, place, and time.  Skin: Skin is warm and dry.  Psychiatric: She has a normal mood and affect. Her behavior is normal.        Assessment & Plan:  Depression - Much improved. PHQ 9 score down to 9. More than 60% improvement in her symptoms on the fluoxetine 20 mg. We could certainly consider going up a little bit more effective to 30 or 40 mg. She will call me in the next few weeks if she decides she wants to go up. Otherwise I'll see her back in 6 weeks.  IBS-can try  stopping the lisinopril for week to see if this improves her lower abdominal symptoms. I would be surprised if it's the Prozac but certainly it is possible. If she feels full simple is causing it we can always change her medication around  GERD-recommend an over-the-counter trial of Zantac for a PPI. Also reviewed some dietary changes. Additional handout provided for dietary measures.

## 2017-01-29 NOTE — Patient Instructions (Signed)
Food Choices for Gastroesophageal Reflux Disease, Adult When you have gastroesophageal reflux disease (GERD), the foods you eat and your eating habits are very important. Choosing the right foods can help ease your discomfort. What guidelines do I need to follow?  Choose fruits, vegetables, whole grains, and low-fat dairy products.  Choose low-fat meat, fish, and poultry.  Limit fats such as oils, salad dressings, butter, nuts, and avocado.  Keep a food diary. This helps you identify foods that cause symptoms.  Avoid foods that cause symptoms. These may be different for everyone.  Eat small meals often instead of 3 large meals a day.  Eat your meals slowly, in a place where you are relaxed.  Limit fried foods.  Cook foods using methods other than frying.  Avoid drinking alcohol.  Avoid drinking large amounts of liquids with your meals.  Avoid bending over or lying down until 2-3 hours after eating. What foods are not recommended? These are some foods and drinks that may make your symptoms worse: Vegetables  Tomatoes. Tomato juice. Tomato and spaghetti sauce. Chili peppers. Onion and garlic. Horseradish. Fruits  Oranges, grapefruit, and lemon (fruit and juice). Meats  High-fat meats, fish, and poultry. This includes hot dogs, ribs, ham, sausage, salami, and bacon. Dairy  Whole milk and chocolate milk. Sour cream. Cream. Butter. Ice cream. Cream cheese. Drinks  Coffee and tea. Bubbly (carbonated) drinks or energy drinks. Condiments  Hot sauce. Barbecue sauce. Sweets/Desserts  Chocolate and cocoa. Donuts. Peppermint and spearmint. Fats and Oils  High-fat foods. This includes French fries and potato chips. Other  Vinegar. Strong spices. This includes black pepper, white pepper, red pepper, cayenne, curry powder, cloves, ginger, and chili powder. The items listed above may not be a complete list of foods and drinks to avoid. Contact your dietitian for more information.    This information is not intended to replace advice given to you by your health care provider. Make sure you discuss any questions you have with your health care provider. Document Released: 01/15/2012 Document Revised: 12/22/2015 Document Reviewed: 05/20/2013 Elsevier Interactive Patient Education  2017 Else[employer]  

## 2017-02-13 ENCOUNTER — Ambulatory Visit (INDEPENDENT_AMBULATORY_CARE_PROVIDER_SITE_OTHER): Payer: BLUE CROSS/BLUE SHIELD | Admitting: Licensed Clinical Social Worker

## 2017-02-13 ENCOUNTER — Other Ambulatory Visit: Payer: Self-pay

## 2017-02-13 DIAGNOSIS — F331 Major depressive disorder, recurrent, moderate: Secondary | ICD-10-CM | POA: Diagnosis not present

## 2017-02-13 DIAGNOSIS — F411 Generalized anxiety disorder: Secondary | ICD-10-CM | POA: Diagnosis not present
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
## 2017-02-13 NOTE — Telephone Encounter (Signed)
Pt stated that during her last ov on 01/29/17, you mentioned that she could think about increasing her prozac to 40 mg.  Pt is wanting that new Rx sent to her [facility].  She also reports that she stopped taking her lisinopril. Since then her bp has been running high and she has been feeling crappy. She is requesting a new bp medication. Please advise. -EH/RMA

## 2017-02-14 MED ORDER — FLUOXETINE HCL 40 MG PO CAPS: 40.0000 mg | ORAL_CAPSULE | Freq: Every day | ORAL | 1 refills | Status: DC

## 2017-02-14 NOTE — Telephone Encounter (Signed)
Okay, new perception sent for 40 mg of the fluoxetine. See if she'll be okay with using hydrochlorothiazide instead of the lisinopril. It works more as a diuretic to lower blood pressure.

## 2017-02-15 MED ORDER — HYDROCHLOROTHIAZIDE 25 MG PO TABS: 25.0000 mg | ORAL_TABLET | Freq: Every day | ORAL | 0 refills | Status: DC

## 2017-02-15 NOTE — Telephone Encounter (Signed)
Left message advising of recommendations.  

## 2017-02-21 ENCOUNTER — Encounter: Payer: Self-pay | Admitting: Obstetrics & Gynecology

## 2017-02-21 ENCOUNTER — Ambulatory Visit (INDEPENDENT_AMBULATORY_CARE_PROVIDER_SITE_OTHER): Payer: BLUE CROSS/BLUE SHIELD | Admitting: Obstetrics & Gynecology

## 2017-02-21 VITALS — BP 118/84 | HR 84 | Resp 16 | Ht 65.5 in | Wt 226.0 lb

## 2017-02-21 DIAGNOSIS — Z1151 Encounter for screening for human papillomavirus (HPV): Secondary | ICD-10-CM

## 2017-02-21 DIAGNOSIS — Z01419 Encounter for gynecological examination (general) (routine) without abnormal findings: Secondary | ICD-10-CM

## 2017-02-21 DIAGNOSIS — Z113 Encounter for screening for infections with a predominantly sexual mode of transmission: Secondary | ICD-10-CM | POA: Diagnosis not present
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
## 2017-02-27 LAB — CYTOLOGY - PAP
Bacterial vaginitis: NEGATIVE
Candida vaginitis: NEGATIVE
Diagnosis: UNDETERMINED — AB
HPV: DETECTED — AB
Trichomonas: NEGATIVE

## 2017-03-01 ENCOUNTER — Ambulatory Visit (INDEPENDENT_AMBULATORY_CARE_PROVIDER_SITE_OTHER): Payer: BLUE CROSS/BLUE SHIELD | Admitting: Licensed Clinical Social Worker

## 2017-03-01 DIAGNOSIS — F411 Generalized anxiety disorder: Secondary | ICD-10-CM | POA: Diagnosis not present

## 2017-03-01 DIAGNOSIS — F331 Major depressive disorder, recurrent, moderate: Secondary | ICD-10-CM | POA: Diagnosis not present
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
## 2017-03-04 ENCOUNTER — Other Ambulatory Visit: Payer: Self-pay | Admitting: Family Medicine

## 2017-03-05 ENCOUNTER — Telehealth: Payer: Self-pay | Admitting: *Deleted

## 2017-03-05 NOTE — Telephone Encounter (Signed)
-----   Message from Allie BossierMyra C Dove, MD sent at 03/01/2017 12:09 PM EDT ----- Colpo needed  Thanks

## 2017-03-05 NOTE — Telephone Encounter (Signed)
Lm on cell phone voicemail to call office.  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

## 2017-03-11 ENCOUNTER — Encounter (HOSPITAL_COMMUNITY): Payer: Self-pay | Admitting: Psychiatry

## 2017-03-11 ENCOUNTER — Ambulatory Visit (INDEPENDENT_AMBULATORY_CARE_PROVIDER_SITE_OTHER): Payer: BLUE CROSS/BLUE SHIELD | Admitting: Psychiatry

## 2017-03-11 VITALS — BP 122/80 | HR 97 | Resp 16 | Ht 65.5 in | Wt 229.0 lb

## 2017-03-11 DIAGNOSIS — F411 Generalized anxiety disorder: Secondary | ICD-10-CM

## 2017-03-11 DIAGNOSIS — Z599 Problem related to housing and economic circumstances, unspecified: Secondary | ICD-10-CM | POA: Diagnosis not present

## 2017-03-11 DIAGNOSIS — F39 Unspecified mood [affective] disorder: Secondary | ICD-10-CM

## 2017-03-11 DIAGNOSIS — Z818 Family history of other mental and behavioral disorders: Secondary | ICD-10-CM | POA: Diagnosis not present

## 2017-03-11 DIAGNOSIS — F331 Major depressive disorder, recurrent, moderate: Secondary | ICD-10-CM | POA: Diagnosis not present
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
## 2017-03-12 ENCOUNTER — Ambulatory Visit: Payer: Self-pay | Admitting: Family Medicine

## 2017-03-15 ENCOUNTER — Ambulatory Visit (INDEPENDENT_AMBULATORY_CARE_PROVIDER_SITE_OTHER): Payer: BLUE CROSS/BLUE SHIELD | Admitting: Licensed Clinical Social Worker

## 2017-03-15 DIAGNOSIS — F411 Generalized anxiety disorder: Secondary | ICD-10-CM

## 2017-03-15 DIAGNOSIS — F331 Major depressive disorder, recurrent, moderate: Secondary | ICD-10-CM

## 2017-03-15 NOTE — Progress Notes (Signed)
   THERAPIST PROGRESS NOTE  Session Time: 9:03 AM to 9:55 AM  Participation Level: Active  Behavioral Response: CasualAlertEuthymic  Type of Therapy: Individual Therapy  Treatment Goals addressed:  patient will work on insight and implementing strategies to help regulate mood, client will develop insight to self, work on self-acceptance, work on healthy relationship skills and work through relationship problems  Interventions: Solution Focused, Strength-based, Supportive, Reframing and Other: Strategies for mood regulation  Summary: Sarah Gonzales is a 26 y.o. female who presents with little things that shouldn't matter makes her anger. Discussed slowing her reactivity time down in stress as a factor as well as communication helping manage these incidents. Relates that mood swings are a lot better, she is doing better with filtering out things before she says them. Relates her feeling that "I'm a terrible person because of what she thinks and says". Relates her experience of being in a good mood and saying more negative things. Discussed process is filtering as something everyone does to filter out negative comments to normalize patient's experience. Discussed stressors at work and reviewed with patient and effective ways for her coping and insight to validate some way she is coping. Sometimes she lets things go and other times she reacts when criticized. Reviewed session and related she is working on emotional reactivity, learning how to manage emotions and brought up exploring difference between mood swings and emotions. Relates she is working on being more positive to counter self criticism and negative self talk.    Suicidal/Homicidal: No  Therapist Response: Reviewed progress and symptoms and continue to work with patient on reframing negative self talk and helping her to see alternative interpretations. Identified patient needing to develop tools to help her with her emotional reactivity that  includes slowing her reaction down, for better management of her emotions, and better communication strategies. Work with patient on effective interpersonal strategies. Help patient to process her feelings around stressors at work to help her discuss label for better coping. Validated patient on feelings. Provided supportive and strength-based interventions.   Plan: Return again in 2 weeks.2. Therapist work with patient on emotional regulation strategies and skills to build a healthy relationship  Diagnosis: Axis I:   major depressive disorder, recurrent, moderate, generalized anxiety disorder    Axis II: No diagnosis    Coolidge Breeze, LCSW 03/15/2017

## 2017-03-25 ENCOUNTER — Encounter: Payer: Self-pay | Admitting: *Deleted

## 2017-03-25 ENCOUNTER — Ambulatory Visit (INDEPENDENT_AMBULATORY_CARE_PROVIDER_SITE_OTHER): Payer: BLUE CROSS/BLUE SHIELD | Admitting: Obstetrics & Gynecology

## 2017-03-25 ENCOUNTER — Encounter: Payer: Self-pay | Admitting: Obstetrics & Gynecology

## 2017-03-25 VITALS — BP 134/87 | HR 88 | Resp 16 | Ht 65.0 in | Wt 226.0 lb

## 2017-03-25 DIAGNOSIS — Z113 Encounter for screening for infections with a predominantly sexual mode of transmission: Secondary | ICD-10-CM

## 2017-03-25 DIAGNOSIS — R87610 Atypical squamous cells of undetermined significance on cytologic smear of cervix (ASC-US): Secondary | ICD-10-CM

## 2017-03-25 DIAGNOSIS — Z3202 Encounter for pregnancy test, result negative: Secondary | ICD-10-CM

## 2017-03-25 DIAGNOSIS — R87810 Cervical high risk human papillomavirus (HPV) DNA test positive: Secondary | ICD-10-CM | POA: Diagnosis not present

## 2017-03-25 DIAGNOSIS — N898 Other specified noninflammatory disorders of vagina: Secondary | ICD-10-CM

## 2017-03-25 LAB — POCT URINE PREGNANCY: Preg Test, Ur: NEGATIVE

## 2017-03-25 NOTE — Progress Notes (Signed)
   Subjective:    Patient ID: Zack Seal, female    DOB: [birth year], 26 y.o.   MRN: [id number]  HPI 26 yo WG0 lady here for a colpo due to HR HPV ASCUS pap 7/18. She uses Liletta for contraception She complains of a vaginal odor. A wet prep at last visit was normal.  Review of Systems     Objective:   Physical Exam  Well nourished, well hydrated white female, no apparent distress Breathing, conversing, and ambulating normally UPT negative, consent signed, time out done Cervix prepped with acetic acid. Transformation zone seen in its entirety. Colpo adequate. 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 I biopsied this area. Silver nitrate yielded hemostasis ECC obtained. She tolerated the procedure well.     Assessment & Plan:  ASCUS + HR HPV pap Rec MVI daily, no smoking Check CT and GC today

## 2017-03-27 LAB — GC/CHLAMYDIA PROBE AMP (~~LOC~~) NOT AT ARMC
Chlamydia: NEGATIVE
Neisseria Gonorrhea: NEGATIVE

## 2017-03-28 ENCOUNTER — Ambulatory Visit (HOSPITAL_COMMUNITY): Payer: Self-pay | Admitting: Licensed Clinical Social Worker

## 2017-04-02 ENCOUNTER — Telehealth: Payer: Self-pay | Admitting: *Deleted

## 2017-04-02 NOTE — Telephone Encounter (Signed)
-----   Message from Allie BossierMyra C Dove, MD sent at 03/28/2017  5:09 PM EDT ----- She will need a cryo. Thanks

## 2017-04-02 NOTE — Telephone Encounter (Signed)
LM on voicemail to call office to schedule cervical cryotherapy due to abnormality on cervical biopsy.

## 2017-04-05 ENCOUNTER — Ambulatory Visit (HOSPITAL_COMMUNITY): Payer: Self-pay | Admitting: Psychiatry

## 2017-04-08 ENCOUNTER — Ambulatory Visit (INDEPENDENT_AMBULATORY_CARE_PROVIDER_SITE_OTHER): Payer: BLUE CROSS/BLUE SHIELD | Admitting: Licensed Clinical Social Worker

## 2017-04-08 DIAGNOSIS — F411 Generalized anxiety disorder: Secondary | ICD-10-CM | POA: Diagnosis not present

## 2017-04-08 DIAGNOSIS — F331 Major depressive disorder, recurrent, moderate: Secondary | ICD-10-CM

## 2017-04-08 NOTE — Progress Notes (Signed)
THERAPIST PROGRESS NOTE  Session Time: 9 AM to 9:55 AM  Participation Level: Active  Behavioral Response: CasualAlertEuthymic  Type of Therapy: Individual Therapy  Treatment Goals addressed:   patient will work on insight and implementing strategies to help regulate mood, client will develop insight to self, work on self-acceptance, work on healthy relationship skills and work through relationship problems  Interventions: Solution Focused, Strength-based, Supportive, Energy managerAnger Management Training and Other: Healthy interpersonal skills  Summary: Sarah Gonzales is a 26 y.o. female who presents with passing of grandfather, dealing with it in weird way, mean, snippy, rude both with fiancee and at work. She feels guilty about a lot of different things. Shares that she is going through losing someone, unexpected, grandma's girl, grandfather and her never had a relationship, at the end she didn't want to say she loved him and good-bye. Described growing up that he was just "kind of there". She thinks she feels bad that she didn't say she loved him at the end but never did that with him growing up. It was one of those things that were forced. Doesn't remember and what she remembers is memories where he is in front of television watching car shows. Feels guilty about that, guilty about girl's trip. After problems in relationship, thinks that she should not be doing something after everything they have been through. He is working on trusting her but he also has related that he doesn't mind her going on girl's trip as long as she is loyal. Describes guilt after saying mean remark at work, not sure why mean and doesn't know where guilt coming from. Explored underlying source of emotions patient related moved in with parents. Says her personality includes being quick to make remark. Wonders if the medication is playing a part. Explored with patient whether mean remarks are causing problems in relationships.  Describes feeling left out at work and way she has isolated herself. Doesn't care about what other people think but then in way she does. Discussed with patient that taking out emotions on other people can be an anger issue and work with patient both on cognitive strategies, and listening to what's behind anger to help her work on it. There continue to be trust issues and relationship and keeps thinking when are they going to get past it but she reminds herself it is a big thing and will take time to get through. Working on effective communication strategies in relationships and discussed with patient and awareness of effective communication strategies are all her relationships. Patient is aware that she notices the difference between when she is being sassy and a joke and when it is mean and this helpful in terms of managing her responses. ,     Suicidal/Homicidal: No  Therapist Response: Reviewed progress and symptoms and identified and help patient process through feelings of guilt. Helped her work through these feelings by discussing and labeling them. Through discussion helped patient to knowledge of what is underlying these feelings for better understanding and occasions where she can challenge them with more healthy ways to feel about herself. Work with her on anger management and providing education in identifying ways in which her near may be destructive when directed toward others, work with patient on developing motivation to change negative patterns, discussed constructive views and self-talk that would help soften anger, as well as slowing her reactivity down and responding to implement healthier reactions. Discussed listening as well to unmet needs behind the anger, listening to anger messages  and noticing patterns. Reinforced patient's insight that it will take time to build back trust, work with patient on effective communication strategies in her work relationships and her romantic relationship.  Provided strength based and supportive interventions  Plan: Return again in 2 weeks.2. Therapist work with patient on emotional regulation strategies and skills to build a healthy relationship  Diagnosis: Axis I:   major depressive disorder, recurrent, moderate, generalized anxiety disorder    Axis II: No diagnosis    Coolidge Breeze, LCSW 04/08/2017

## 2017-04-16 ENCOUNTER — Encounter: Payer: Self-pay | Admitting: Obstetrics & Gynecology

## 2017-04-16 ENCOUNTER — Ambulatory Visit (INDEPENDENT_AMBULATORY_CARE_PROVIDER_SITE_OTHER): Payer: BLUE CROSS/BLUE SHIELD | Admitting: Obstetrics & Gynecology

## 2017-04-16 VITALS — BP 128/78 | HR 88 | Resp 16 | Ht 65.0 in | Wt 226.0 lb

## 2017-04-16 DIAGNOSIS — N871 Moderate cervical dysplasia: Secondary | ICD-10-CM

## 2017-04-16 MED ORDER — ALPRAZOLAM 1 MG PO TABS: ORAL_TABLET | ORAL | 0 refills | Status: DC

## 2017-04-16 MED ORDER — ALPRAZOLAM ER 1 MG PO TB24: ORAL_TABLET | ORAL | 0 refills | Status: DC

## 2017-04-16 NOTE — Progress Notes (Signed)
   Subjective:    Patient ID: Sarah Gonzales, female    DOB: [birth year], 26 y.o.   MRN: [id number]  HPI 26 yo WG0 here for a cryo. 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  Review of Systems     Objective:   Physical Exam She was not tolerating the speculum in place, very nervous, panicky She would prefer to reschedule and be pretreated with xanax       Assessment & Plan:  As above

## 2017-04-23 ENCOUNTER — Ambulatory Visit (HOSPITAL_COMMUNITY): Payer: Self-pay | Admitting: Licensed Clinical Social Worker

## 2017-04-29 ENCOUNTER — Encounter: Payer: Self-pay | Admitting: Family Medicine

## 2017-04-30 ENCOUNTER — Telehealth: Payer: Self-pay

## 2017-04-30 NOTE — Telephone Encounter (Signed)
Called left message for pt that I did some inquiring on the cost of the cryo procedure for self pay patients and it will cost $202.50 for self pay patients.

## 2017-05-07 ENCOUNTER — Ambulatory Visit (HOSPITAL_COMMUNITY): Payer: Self-pay | Admitting: Licensed Clinical Social Worker

## 2017-05-09 ENCOUNTER — Encounter: Payer: Self-pay | Admitting: Obstetrics & Gynecology

## 2017-06-07 ENCOUNTER — Other Ambulatory Visit: Payer: Self-pay | Admitting: Family Medicine

## 2017-09-05 ENCOUNTER — Other Ambulatory Visit: Payer: Self-pay | Admitting: Family Medicine

## 2017-09-16 ENCOUNTER — Telehealth: Payer: Self-pay | Admitting: Family Medicine

## 2017-09-16 NOTE — Telephone Encounter (Signed)
lvm informing pt that she will need to call and schedule a f/u appt and that she will need to [facility] [address] since [facility] is [facility] the request was sent from, and once she comes in for her f/u we will gladly change the pharmacy [facility] [place].Loralee PacasBarkley, Jagjit Riner WallsLynetta

## 2017-09-16 NOTE — Telephone Encounter (Signed)
Pt called. She wants the current(& future) script to [facility] in [place], [facility] but [facility] [place]

## 2017-10-08 ENCOUNTER — Other Ambulatory Visit: Payer: Self-pay

## 2017-10-08 MED ORDER — FLUOXETINE HCL 40 MG PO CAPS: 40.0000 mg | ORAL_CAPSULE | Freq: Every day | ORAL | 0 refills | Status: DC

## 2017-10-18 ENCOUNTER — Ambulatory Visit: Payer: Self-pay | Admitting: Family Medicine

## 2017-10-18 NOTE — Progress Notes (Deleted)
   Subjective:    Patient ID: Sarah Gonzales, female    DOB: 12/14/1990, 27 y.o.   MRN: [id number]  HPI 27 year old female is here today to follow-up for depression/anxiety-was previously seeing her psychiatrist Dr. Gilmore LarocheAkhtar as well as seeing Corrie DandyMary 1 of the therapist downstairs until about September.  Hypertension- Pt denies chest pain, SOB, dizziness, or heart palpitations.  Taking meds as directed w/o problems.  Denies medication side effects.       Review of Systems     Objective:   Physical Exam  Constitutional: She is oriented to person, place, and time. She appears well-developed and well-nourished.  HENT:  Head: Normocephalic and atraumatic.  Cardiovascular: Normal rate, regular rhythm and normal heart sounds.  Pulmonary/Chest: Effort normal and breath sounds normal.  Neurological: She is alert and oriented to person, place, and time.  Skin: Skin is warm and dry.  Psychiatric: She has a normal mood and affect. Her behavior is normal.          Assessment & Plan:  Depression -   HTN -

## 2017-10-24 ENCOUNTER — Ambulatory Visit: Payer: Self-pay | Admitting: Family Medicine

## 2017-11-22 ENCOUNTER — Other Ambulatory Visit: Payer: Self-pay

## 2017-11-22 MED ORDER — FLUOXETINE HCL 40 MG PO CAPS: 40.0000 mg | ORAL_CAPSULE | Freq: Every day | ORAL | 0 refills | Status: DC

## 2017-11-26 ENCOUNTER — Encounter: Payer: Self-pay | Admitting: Family Medicine

## 2017-11-26 ENCOUNTER — Ambulatory Visit (INDEPENDENT_AMBULATORY_CARE_PROVIDER_SITE_OTHER): Payer: Self-pay | Admitting: Family Medicine

## 2017-11-26 VITALS — BP 142/96 | HR 79 | Ht 65.0 in | Wt 204.0 lb

## 2017-11-26 DIAGNOSIS — F320 Major depressive disorder, single episode, mild: Secondary | ICD-10-CM

## 2017-11-26 DIAGNOSIS — I10 Essential (primary) hypertension: Secondary | ICD-10-CM

## 2017-11-26 MED ORDER — HYDROCHLOROTHIAZIDE 25 MG PO TABS: 25.0000 mg | ORAL_TABLET | Freq: Every day | ORAL | 1 refills | Status: DC

## 2017-11-26 MED ORDER — FLUOXETINE HCL 40 MG PO CAPS: 40.0000 mg | ORAL_CAPSULE | Freq: Every day | ORAL | 1 refills | Status: DC

## 2017-11-26 NOTE — Progress Notes (Signed)
Subjective:    CC: BP, depression   HPI:  She is here for follow-up depression.  I have not seen her since July of last year.  Currently on fluoxetine 40 mg daily .  Overall she is doing well more recently.  Last fall actually seen after I just saw her in July she lost her job and did not have a job for about 4 months she said she got really depressed for about a month and really just barely left the house.  But she was able to finally find a new job and is doing well and is financially back on her feet.  She her fianc are now living with her parents.  They are trying to save up some money before they get married in September to try to buy some land to build a house on.  Hypertension- Pt denies chest pain, SOB, dizziness, or heart palpitations.  Taking meds as directed w/o problems.  Denies medication side effects.  She did try the keto diet for about 3 months starting in January and actually lost 30 pounds.  She plans on doing it again this summer to try to lose about 20 more pounds before her wedding.  She said on the keto diet she actually felt great.  Physically.  She said her migraines were completely gone for about 3 months.  She is been eating a low-carb diet in the interim to help maintain her weight.    Past medical history, Surgical history, Family history not pertinant except as noted below, Social history, Allergies, and medications have been entered into the medical record, reviewed, and corrections made.   Review of Systems: No fevers, chills, night sweats, weight loss, chest pain, or shortness of breath.   Objective:    General: Well Developed, well nourished, and in no acute distress.  Neuro: Alert and oriented x3, extra-ocular muscles intact, sensation grossly intact.  HEENT: Normocephalic, atraumatic  Skin: Warm and dry, no rashes. Cardiac: Regular rate and rhythm, no murmurs rubs or gallops, no lower extremity edema.  Respiratory: Clear to auscultation bilaterally. Not  using accessory muscles, speaking in full sentences.    Impression and Recommendations:    HTN -blood pressure medications and blood pressure is a little elevated today.  New prescription sent for the next 6 months.  She should hopefully have health insurance, October November of next year after she gets married and goes on her husband's plans I will see her back then.  She is technically due for blood work next month but will hold off until I see her again in 6 months.  Depression -doing well overall.  Continue with current regimen.  Happy with it and feels like it is effective.

## 2018-06-16 ENCOUNTER — Telehealth: Payer: Self-pay | Admitting: *Deleted

## 2018-06-16 NOTE — Telephone Encounter (Signed)
Returned patient call to schedule an appointment. Patient called to schedule as I was documenting note.

## 2018-06-23 ENCOUNTER — Ambulatory Visit (INDEPENDENT_AMBULATORY_CARE_PROVIDER_SITE_OTHER): Payer: 59 | Admitting: Family Medicine

## 2018-06-23 ENCOUNTER — Encounter: Payer: Self-pay | Admitting: Family Medicine

## 2018-06-23 VITALS — BP 136/86 | HR 90 | Resp 16 | Ht 65.0 in | Wt 213.0 lb

## 2018-06-23 DIAGNOSIS — Z01419 Encounter for gynecological examination (general) (routine) without abnormal findings: Secondary | ICD-10-CM | POA: Diagnosis not present

## 2018-06-23 DIAGNOSIS — N871 Moderate cervical dysplasia: Secondary | ICD-10-CM

## 2018-06-23 DIAGNOSIS — N907 Vulvar cyst: Secondary | ICD-10-CM

## 2018-06-23 DIAGNOSIS — Z124 Encounter for screening for malignant neoplasm of cervix: Secondary | ICD-10-CM | POA: Diagnosis not present

## 2018-06-23 NOTE — Progress Notes (Signed)
GYNECOLOGY ANNUAL PREVENTATIVE CARE ENCOUNTER NOTE  Subjective:   Sarah Gonzales is a 27 y.o. G0P0000 female here for a routine annual gynecologic exam.  Current complaints: none.   Denies abnormal vaginal bleeding, discharge, pelvic pain, problems with intercourse or other gynecologic concerns.    Gynecologic History Patient's last menstrual period was 06/02/2018. Patient is sexually active  Contraception: IUD Last Pap: 2018. Results were: ASCUS +HPV, Colposcopy CIN2. Last mammogram: n/a.   Obstetric History OB History  Gravida Para Term Preterm AB Living  0 0 0 0 0 0  SAB TAB Ectopic Multiple Live Births  0 0 0 0      Past Medical History:  Diagnosis Date  . Anxiety   . Bartholin cyst   . Chronic headaches    Since the age of 27, severe in the past.  . Hypertension   . Warts     History reviewed. No pertinent surgical history.  Current Outpatient Medications on File Prior to Visit  Medication Sig Dispense Refill  . FLUoxetine (PROZAC) 40 MG capsule Take 1 capsule (40 mg total) by mouth daily. 90 capsule 1  . levonorgestrel (MIRENA) 20 MCG/24HR IUD 1 each by Intrauterine route once.    . hydrochlorothiazide (HYDRODIURIL) 25 MG tablet Take 1 tablet (25 mg total) by mouth daily. Due for follow up visit (Patient not taking: Reported on 06/23/2018) 90 tablet 1   No current facility-administered medications on file prior to visit.     No Known Allergies  Social History   Socioeconomic History  . Marital status: Single    Spouse name: Not on file  . Number of children: Not on file  . Years of education: Not on file  . Highest education level: Not on file  Occupational History  . Occupation: Engineering [employer]  Social Needs  . Financial resource strain: Not on file  . Food insecurity:    Worry: Not on file    Inability: Not on file  . Transportation needs:    Medical: Not on file    Non-medical: Not on file  Tobacco Use  . Smoking status: Never Smoker  . Smokeless  tobacco: Never Used  Substance and Sexual Activity  . Alcohol use: Yes    Alcohol/week: 2.0 standard drinks    Types: 2 Standard drinks or equivalent per week    Comment: socially  . Drug use: No  . Sexual activity: Yes    Partners: Male    Birth control/protection: IUD    Comment: lives with family, student at [employer], involved in drama  Lifestyle  . Physical activity:    Days per week: Not on file    Minutes per session: Not on file  . Stress: Not on file  Relationships  . Social connections:    Talks on phone: Not on file    Gets together: Not on file    Attends religious service: Not on file    Active member of club or organization: Not on file    Attends meetings of clubs or organizations: Not on file    Relationship status: Not on file  . Intimate partner violence:    Fear of current or ex partner: Not on file    Emotionally abused: Not on file    Physically abused: Not on file    Forced sexual activity: Not on file  Other Topics Concern  . Not on file  Social History Narrative  . Not on file    Family History  Problem  Relation Age of Onset  . Hypertension Mother   . Hypertension Father   . Sleep apnea Father   . Asthma Unknown        aunt  . Bipolar disorder Maternal Aunt   . Hypertension Maternal Grandfather   . Lung cancer Maternal Grandfather   . Hypertension Maternal Grandmother   . Hypertension Paternal Grandfather   . Hypertension Paternal Grandmother     The following portions of the patient's history were reviewed and updated as appropriate: allergies, current medications, past family history, past medical history, past social history, past surgical history and problem list.  Review of Systems Pertinent items noted in HPI and remainder of comprehensive ROS otherwise negative.   Objective:  BP 136/86   Pulse 90   Resp 16   Ht 5\' 5"  (1.651 m)   Wt 213 lb (96.6 kg)   LMP 06/02/2018   BMI 35.45 kg/m  CONSTITUTIONAL: Well-developed,  well-nourished female in no acute distress.  HENT:  Normocephalic, atraumatic, External right and left ear normal. Oropharynx is clear and moist EYES: Conjunctivae and EOM are normal. Pupils are equal, round, and reactive to light. No scleral icterus.  NECK: Normal range of motion, supple, no masses.  Normal thyroid.   CARDIOVASCULAR: Normal heart rate noted, regular rhythm RESPIRATORY: Clear to auscultation bilaterally. Effort and breath sounds normal, no problems with respiration noted. BREASTS: Symmetric in size. No masses, skin changes, nipple drainage, or lymphadenopathy. ABDOMEN: Soft, normal bowel sounds, no distention noted.  No tenderness, rebound or guarding.  PELVIC: Normal appearing external genitalia; normal appearing vaginal mucosa and cervix.  Small, mobile 0.5cm nontender inclusion cyst right vulva. No abnormal discharge noted.  Normal uterine size, no other palpable masses, no uterine or adnexal tenderness. MUSCULOSKELETAL: Normal range of motion. No tenderness.  No cyanosis, clubbing, or edema.  2+ distal pulses. SKIN: Skin is warm and dry. No rash noted. Not diaphoretic. No erythema. No pallor. NEUROLOGIC: Alert and oriented to person, place, and time. Normal reflexes, muscle tone coordination. No cranial nerve deficit noted. PSYCHIATRIC: Normal mood and affect. Normal behavior. Normal judgment and thought content.  Assessment:  Annual gynecologic examination with pap smear   Plan:  1. Well Woman Exam Will follow up results of pap smear and manage accordingly.  2. CIN II (cervical intraepithelial neoplasia II) PAP today  3. Inclusion cyst of vulva Benign. Discussed to return if enlargens or becomes symptomatic.   Routine preventative health maintenance measures emphasized. Please refer to After Visit Summary for other counseling recommendations.    Candelaria Celeste, DO [facility]

## 2018-06-25 LAB — CYTOLOGY - PAP: Diagnosis: NEGATIVE

## 2018-07-07 ENCOUNTER — Ambulatory Visit: Payer: 59 | Admitting: Family Medicine

## 2018-07-07 ENCOUNTER — Encounter: Payer: Self-pay | Admitting: Family Medicine

## 2018-07-07 VITALS — BP 129/75 | HR 80 | Ht 64.57 in | Wt 214.0 lb

## 2018-07-07 DIAGNOSIS — J0110 Acute frontal sinusitis, unspecified: Secondary | ICD-10-CM

## 2018-07-07 DIAGNOSIS — R42 Dizziness and giddiness: Secondary | ICD-10-CM

## 2018-07-07 DIAGNOSIS — I10 Essential (primary) hypertension: Secondary | ICD-10-CM | POA: Diagnosis not present

## 2018-07-07 DIAGNOSIS — F320 Major depressive disorder, single episode, mild: Secondary | ICD-10-CM | POA: Diagnosis not present

## 2018-07-07 MED ORDER — AMOXICILLIN-POT CLAVULANATE 875-125 MG PO TABS: 1.0000 | ORAL_TABLET | Freq: Two times a day (BID) | ORAL | 0 refills | Status: DC

## 2018-07-07 MED ORDER — FLUOXETINE HCL 10 MG PO CAPS: ORAL_CAPSULE | ORAL | 2 refills | Status: DC

## 2018-07-07 NOTE — Patient Instructions (Addendum)
Decrease the fluoxetine to 30mg  daily for 3 weeks and then decrease to 20 mg daily.      Sinusitis, Adult Sinusitis is soreness and inflammation of your sinuses. Sinuses are hollow spaces in the bones around your face. They are located:  Around your eyes.  In the middle of your forehead.  Behind your nose.  In your cheekbones.  Your sinuses and nasal passages are lined with a stringy fluid (mucus). Mucus normally drains out of your sinuses. When your nasal tissues get inflamed or swollen, the mucus can get trapped or blocked so air cannot flow through your sinuses. This lets bacteria, viruses, and funguses grow, and that leads to infection. Follow these instructions at home: Medicines  Take, use, or apply over-the-counter and prescription medicines only as told by your doctor. These may include nasal sprays.  If you were prescribed an antibiotic medicine, take it as told by your doctor. Do not stop taking the antibiotic even if you start to feel better. Hydrate and Humidify  Drink enough water to keep your pee (urine) clear or pale yellow.  Use a cool mist humidifier to keep the humidity level in your home above 50%.  Breathe in steam for 10-15 minutes, 3-4 times a day or as told by your doctor. You can do this in the bathroom while a hot shower is running.  Try not to spend time in cool or dry air. Rest  Rest as much as possible.  Sleep with your head raised (elevated).  Make sure to get enough sleep each night. General instructions  Put a warm, moist washcloth on your face 3-4 times a day or as told by your doctor. This will help with discomfort.  Wash your hands often with soap and water. If there is no soap and water, use hand sanitizer.  Do not smoke. Avoid being around people who are smoking (secondhand smoke).  Keep all follow-up visits as told by your doctor. This is important. Contact a doctor if:  You have a fever.  Your symptoms get worse.  Your  symptoms do not get better within 10 days. Get help right away if:  You have a very bad headache.  You cannot stop throwing up (vomiting).  You have pain or swelling around your face or eyes.  You have trouble seeing.  You feel confused.  Your neck is stiff.  You have trouble breathing. This information is not intended to replace advice given to you by your health care provider. Make sure you discuss any questions you have with your health care provider. Document Released: 01/02/2008 Document Revised: 03/11/2016 Document Reviewed: 05/11/2015 Elsevier Interactive Patient Education  [employer]2018 [employer] Inc[employer]

## 2018-07-07 NOTE — Progress Notes (Signed)
Subjective:    CC:   HPI:  Hypertension- Pt denies chest pain, SOB, dizziness, or heart palpitations.  Taking meds as directed w/o problems.  Denies medication side effects.    F/U MDD -at the point where she would like to try getting off her fluoxetine.  She is been on it for about 8 to 9 years and just wonders if she really needs it anymore.  She is doing well and is ready to try a very slow taper.  She would like to see if it improves some of the potential side effects that she is been experiencing such as low sex drive.   She also complains of upper respiratory type symptoms for 2 weeks.  Including sinus congestion facial pressure particularly over the eyebrows bilaterally and some postnasal drip.  She is had a mild cough.  No fevers chills or sweats.  She is also been having some intermittent dizziness for the last 2 weeks.  She says she will notice it if she bends over and stands up quickly or if she even just standing it will come on for just a couple of seconds and then go away.  It can happen multiple times in 1 day.  Past medical history, Surgical history, Family history not pertinant except as noted below, Social history, Allergies, and medications have been entered into the medical record, reviewed, and corrections made.   Review of Systems: No fevers, chills, night sweats, weight loss, chest pain, or shortness of breath.   Objective:    General: Well Developed, well nourished, and in no acute distress.  Neuro: Alert and oriented x3, extra-ocular muscles intact, sensation grossly intact.  HEENT: Normocephalic, atraumatic, oropharynx is clear though her tonsils are large.  No significant cervical lymphadenopathy.  TMs and canals are clear bilaterally. Skin: Warm and dry, no rashes. Cardiac: Regular rate and rhythm, no murmurs rubs or gallops, no lower extremity edema.  Respiratory: Clear to auscultation bilaterally. Not using accessory muscles, speaking in full  sentences.   Impression and Recommendations:    HTN - Well controlled. Continue current regimen. Follow up in  6 mo.    MDD -discussed options and how to taper her fluoxetine she would like to go a little slowly.  We will taper her down to 20 mg and then have her stay on that for 3 months and then I will see her back at that point in time.  Acute sinusitis-we will treat with Augmentin.  Call if not significantly better in 1 week.  Dizziness - likely related to the sinus infection.  If not better in one week please return.

## 2018-09-02 ENCOUNTER — Encounter: Payer: Self-pay | Admitting: Osteopathic Medicine

## 2018-09-02 ENCOUNTER — Ambulatory Visit: Payer: 59 | Admitting: Osteopathic Medicine

## 2018-09-02 VITALS — BP 139/82 | HR 82 | Temp 97.9°F | Wt 224.4 lb

## 2018-09-02 DIAGNOSIS — G43011 Migraine without aura, intractable, with status migrainosus: Secondary | ICD-10-CM

## 2018-09-02 MED ORDER — PROPRANOLOL HCL ER 60 MG PO CP24: 60.0000 mg | ORAL_CAPSULE | Freq: Every day | ORAL | 1 refills | Status: DC

## 2018-09-02 MED ORDER — BUTALBITAL-APAP-CAFFEINE 50-325-40 MG PO TABS: 1.0000 | ORAL_TABLET | Freq: Four times a day (QID) | ORAL | 0 refills | Status: AC | PRN

## 2018-09-02 MED ORDER — DEXAMETHASONE SODIUM PHOSPHATE 4 MG/ML IJ SOLN
4.0000 mg | Freq: Once | INTRAMUSCULAR | Status: AC
  Administered 2018-09-02: 4 mg via INTRAMUSCULAR

## 2018-09-02 MED ORDER — KETOROLAC TROMETHAMINE 60 MG/2ML IM SOLN
60.0000 mg | Freq: Once | INTRAMUSCULAR | Status: AC
  Administered 2018-09-02: 60 mg via INTRAMUSCULAR

## 2018-09-02 NOTE — Patient Instructions (Signed)
Plan:  Will get injection of Toradol and steroid in the office today to treat this headache and prevent recurrence.   Fioricet as needed if OTC medications aren't working (can use Ibuprofen 800 mg up to 4 times per day and/or Tylenol 1000 mg up to 4 times per day) - can use MAXIMUM 6 pills of this in 24 hour period, do NOT use more than 3 days per month due to risk of rebound headache and/or dependence on medication.   Propranolol to help blood pressure and prevent migraines  Advise follow-up with Dr Linford Arnold in a month to recheck symptoms and blood pressure on new medicine  Call us sooner if worse headaches, dizziness, other concerning symptoms

## 2018-09-02 NOTE — Progress Notes (Signed)
HPI: Sarah Gonzales is a 28 y.o. female who  has a past medical history of Anxiety, Bartholin cyst, Chronic headaches, Hypertension, and Warts.  she presents to [facility] today, 09/02/18,  for chief complaint of:  Concern for Migraine  Headache  . Location: L side, frontal, occasionally into R side . Quality: throbbing . Severity: reports 3/10 . Duration: 6 days . Assoc signs/symptoms: phono/photosensitivity   No abortive or preventive migraine meds on current list - looks like previously on Topamax in 2013, Imitrex 2012  Typically gets headaches like this around her period that go away fairly soon     At today's visit 09/02/18 ... PMH, PSH, FH reviewed and updated as needed.  Current medication list and allergy/intolerance hx reviewed and updated as needed. (See remainder of HPI, ROS, Phys Exam below)         ASSESSMENT/PLAN: The encounter diagnosis was Intractable migraine without aura and with status migrainosus.   Trial beta blocker for ppx given comorbid HTN  Consider TCA given comorbid MDD  Will hold off on Imitrex given HTN, trial Fioricet w/ strict limits - see pt instructions and Rx sig  F/u PCP to discuss further   Meds ordered this encounter  Medications  . propranolol ER (INDERAL LA) 60 MG 24 hr capsule    Sig: Take 1 capsule (60 mg total) by mouth daily.    Dispense:  30 capsule    Refill:  1  . butalbital-acetaminophen-caffeine (FIORICET, ESGIC) 50-325-40 MG tablet    Sig: Take 1-2 tablets by mouth every 6 (six) hours as needed for headache. MAXIMUM USE: 6 capsules in 24 hours, use no more than 3 days per month    Dispense:  15 tablet    Refill:  0    Patient Instructions  Plan:  Will get injection of Toradol and steroid in the office today to treat this headache and prevent recurrence.   Fioricet as needed if OTC medications aren't working (can use Ibuprofen 800 mg up to 4 times per day and/or Tylenol  1000 mg up to 4 times per day) - can use MAXIMUM 6 pills of this in 24 hour period, do NOT use more than 3 days per month due to risk of rebound headache and/or dependence on medication.   Propranolol to help blood pressure and prevent migraines  Advise follow-up with Dr Linford Arnold in a month to recheck symptoms and blood pressure on new medicine  Call us sooner if worse headaches, dizziness, other concerning symptoms      Follow-up plan: Return if symptoms worsen or fail to improve, and 10/2018 as directed by Dr Linford Arnold .                                                 ################################################# ################################################# ################################################# #################################################    Current Meds  Medication Sig  . FLUoxetine (PROZAC) 10 MG capsule 3 caps po QD x 3 weeks, then decrease to 2 caps po QD.  . hydrochlorothiazide (HYDRODIURIL) 25 MG tablet Take 1 tablet (25 mg total) by mouth daily. Due for follow up visit  . levonorgestrel (MIRENA) 20 MCG/24HR IUD 1 each by Intrauterine route once.    No Known Allergies     Review of Systems:  Constitutional: No recent illness  HEENT: +headache, no vision change  Cardiac: No  chest  pain, No  pressure, No palpitations  Respiratory:  No  shortness of breath. No  Cough  Gastrointestinal: No  abdominal pain  Musculoskeletal: No new myalgia/arthralgia  Neurologic: No  weakness, No  Dizziness   Exam:  BP 139/82 (BP Location: Left Arm, Patient Position: Sitting, Cuff Size: Large)   Pulse 82   Temp 97.9 F (36.6 C) (Oral)   Wt 224 lb 6.4 oz (101.8 kg)   BMI 37.84 kg/m   Constitutional: VS see above. General Appearance: alert, well-developed, well-nourished, NAD  Eyes: Normal lids and conjunctive, non-icteric sclera  Ears, Nose, Mouth, Throat: MMM, Normal external inspection  ears/nares/mouth/lips/gums.  Neck: No masses, trachea midline.   Respiratory: Normal respiratory effort. no wheeze, no rhonchi, no rales  Cardiovascular: S1/S2 normal, no murmur, no rub/gallop auscultated. RRR.   Musculoskeletal: Gait normal. Symmetric and independent movement of all extremities  Neurological: Normal balance/coordination. No tremor. EOMI, PERRL, no nystagmus.  Skin: warm, dry, intact.   Psychiatric: Normal judgment/insight. Normal mood and affect. Oriented x3.       Visit summary with medication list and pertinent instructions was printed for patient to review, patient was advised to alert us if any updates are needed. All questions at time of visit were answered - patient instructed to contact office with any additional concerns. ER/RTC precautions were reviewed with the patient and understanding verbalized.      Please note: voice recognition software was used to produce this document, and typos may escape review. Please contact Dr. Lyn HollingsheadAlexander for any needed clarifications.    Follow up plan: Return if symptoms worsen or fail to improve, and 10/2018 as directed by Dr Linford ArnoldMetheney .

## 2018-11-17 ENCOUNTER — Telehealth: Payer: Self-pay | Admitting: Family Medicine

## 2018-11-17 MED ORDER — PROPRANOLOL HCL ER 60 MG PO CP24: 60.0000 mg | ORAL_CAPSULE | Freq: Every day | ORAL | 0 refills | Status: DC

## 2018-11-17 NOTE — Telephone Encounter (Signed)
Patient called and have phone visit with you tomorrow and is needing a refill of her Propanolol and she was supposed to follow up with you and wants a few sent since she took her last pill today. Please advise.

## 2018-11-17 NOTE — Telephone Encounter (Signed)
Short prescription sent for today.  We can readdress for 90-day supply tomorrow during the telephone visit.

## 2018-11-17 NOTE — Telephone Encounter (Signed)
Left brief VM that Propanolol has been sent to the pharmacy. Patient was asked to call back with any questions.

## 2018-11-18 ENCOUNTER — Ambulatory Visit (INDEPENDENT_AMBULATORY_CARE_PROVIDER_SITE_OTHER): Payer: 59 | Admitting: Family Medicine

## 2018-11-18 ENCOUNTER — Encounter: Payer: Self-pay | Admitting: Family Medicine

## 2018-11-18 VITALS — Ht 64.57 in | Wt 230.0 lb

## 2018-11-18 DIAGNOSIS — G43011 Migraine without aura, intractable, with status migrainosus: Secondary | ICD-10-CM | POA: Diagnosis not present

## 2018-11-18 DIAGNOSIS — I10 Essential (primary) hypertension: Secondary | ICD-10-CM

## 2018-11-18 DIAGNOSIS — F324 Major depressive disorder, single episode, in partial remission: Secondary | ICD-10-CM | POA: Diagnosis not present

## 2018-11-18 MED ORDER — PROPRANOLOL HCL ER 60 MG PO CP24: 60.0000 mg | ORAL_CAPSULE | Freq: Every day | ORAL | 1 refills | Status: DC

## 2018-11-18 NOTE — Progress Notes (Signed)
She has weaned down to 10 mg of the Fluoxetine daily. Doing well on the Propranolol. RF sent on 11/17/2018 for #30 no additional refills given.Laureen Ochs, Viann Shove, CMA

## 2018-11-18 NOTE — Progress Notes (Signed)
Virtual Visit via Video Note  I connected with Sarah Gonzales on 11/18/18 at  2:20 PM EDT by a video enabled telemedicine application and verified that I am speaking with the correct person using two identifiers.   I discussed the limitations of evaluation and management by telemedicine and the availability of in person appointments. The patient expressed understanding and agreed to proceed.  Subjective:    CC: F/U new med change for BP and migaines  HPI: About 2 months ago she saw Dr. Lyn Hollingshead, my partner, for status migrainus for 3 weeks at that time. She was started on betablocker for both HA control and BP control.    Hypertension- Pt denies chest pain, SOB, dizziness, or heart palpitations.  Taking meds as directed w/o problems.  Denies medication side effects.  Has felt good on the medication. Not able to check BP at home right now.   She she has gained weight back over the winter and admits hasn't been eating the best.  She wonders if any of her med coould be contributing. She had lost a lot of weight on the keto diet but has been off of that for several months.   F/u migraines HA- hasn't had a migraine for the last 2 months.  Couple of time got a mild HA and just took and Tylenol and went away.    She has been slowly weaning her fluoxetine.  Noticed some irritability after dropped her dose from 20mg  down to 10mg  but the last 3 days she has been doing well.    Past medical history, Surgical history, Family history not pertinant except as noted below, Social history, Allergies, and medications have been entered into the medical record, reviewed, and corrections made.   Review of Systems: No fevers, chills, night sweats, weight loss, chest pain, or shortness of breath.   Objective:    General: Speaking clearly in complete sentences without any shortness of breath.  Alert and oriented x3.  Normal judgment. No apparent acute distress.    Impression and Recommendations:    HTN  -asked her to try to check if able afn if not paln fo rnurse visit  Later in May or early June.   Migraine HA - much better on the beta blocker. She is very happy with her regimen. Continue for now.    Abnormal weight gain - discussed options. Work on cutting back on calories and portions and making healthier choices. Work on regular exercise as well.   Depression - doing really well on the fluoxetine wean thus far. Will continue to tape completely off over the next couple of weeks. If any problems can always restart the medication.       I discussed the assessment and treatment plan with the patient. The patient was provided an opportunity to ask questions and all were answered. The patient agreed with the plan and demonstrated an understanding of the instructions.   The patient was advised to call back or seek an in-person evaluation if the symptoms worsen or if the condition fails to improve as anticipated.   Nani Gasser, MD

## 2018-11-28 ENCOUNTER — Other Ambulatory Visit: Payer: Self-pay | Admitting: Family Medicine

## 2018-12-19 ENCOUNTER — Encounter: Payer: Self-pay | Admitting: Family Medicine

## 2019-02-27 ENCOUNTER — Encounter: Payer: Self-pay | Admitting: Family Medicine

## 2019-02-27 ENCOUNTER — Other Ambulatory Visit: Payer: Self-pay

## 2019-02-27 ENCOUNTER — Ambulatory Visit (INDEPENDENT_AMBULATORY_CARE_PROVIDER_SITE_OTHER): Payer: 59 | Admitting: Family Medicine

## 2019-02-27 VITALS — BP 126/69 | HR 66 | Ht 65.0 in | Wt 231.0 lb

## 2019-02-27 DIAGNOSIS — M25521 Pain in right elbow: Secondary | ICD-10-CM | POA: Diagnosis not present

## 2019-02-27 DIAGNOSIS — Z23 Encounter for immunization: Secondary | ICD-10-CM | POA: Diagnosis not present

## 2019-02-27 DIAGNOSIS — M7711 Lateral epicondylitis, right elbow: Secondary | ICD-10-CM | POA: Diagnosis not present

## 2019-02-27 NOTE — Progress Notes (Signed)
Pt was ripping out carpet about 2 months ago. She said that it hurts to straighten her R arm completely out and if she picks up something it really hurts her more. She said that the area is throbbing now and the the pain actually got worse about 1.5 wks ago. She hasn't tried ice/heat. She has taken tylenol. This is her dominant hand/arm.Sarah Gonzales, Lahoma Crocker

## 2019-02-27 NOTE — Patient Instructions (Signed)
Try to get an elbow strap.  Take 1 Aleve twice a day. Ok to take 2 if needed.  Ice for about 5 min 2-3 times a day.   Avoid heavy lifting with that arm.

## 2019-02-27 NOTE — Progress Notes (Signed)
Acute Office Visit  Subjective:    Patient ID: Sarah Gonzales, female    DOB: [birth year], 28 y.o.   MRN: [id number]  Chief Complaint  Patient presents with  . Elbow Pain    HPI Patient is in today for right elbow pain. Pt was ripping out carpet about 2 months ago. She said that it hurts to straighten her R arm completely out and if she picks up something it really hurts her more. She said that the area is throbbing now and the the pain actually got worse about 1.5 wks ago. She hasn't tried ice/heat. She has taken tylenol. This is her dominant hand/arm.  Past Medical History:  Diagnosis Date  . Anxiety   . Bartholin cyst   . Chronic headaches    Since the age of 53, severe in the past.  . Hypertension   . Warts     No past surgical history on file.  Family History  Problem Relation Age of Onset  . Hypertension Mother   . Hypertension Father   . Sleep apnea Father   . Asthma Unknown        aunt  . Bipolar disorder Maternal Aunt   . Hypertension Maternal Grandfather   . Lung cancer Maternal Grandfather   . Hypertension Maternal Grandmother   . Hypertension Paternal Grandfather   . Hypertension Paternal Grandmother     Social History   Socioeconomic History  . Marital status: Single    Spouse name: Not on file  . Number of children: Not on file  . Years of education: Not on file  . Highest education level: Not on file  Occupational History  . Occupation: Psychologist, prison and probation services Needs  . Financial resource strain: Not on file  . Food insecurity    Worry: Not on file    Inability: Not on file  . Transportation needs    Medical: Not on file    Non-medical: Not on file  Tobacco Use  . Smoking status: Never Smoker  . Smokeless tobacco: Never Used  Substance and Sexual Activity  . Alcohol use: Yes    Alcohol/week: 2.0 standard drinks    Types: 2 Standard drinks or equivalent per week    Comment: socially  . Drug use: No  . Sexual activity: Yes    Partners: Male   Birth control/protection: I.U.D.    Comment: lives with family, student at [employer], involved in [place]  . Physical activity    Days per week: Not on file    Minutes per session: Not on file  . Stress: Not on file  Relationships  . Social Herbalist on phone: Not on file    Gets together: Not on file    Attends religious service: Not on file    Active member of club or organization: Not on file    Attends meetings of clubs or organizations: Not on file    Relationship status: Not on file  . Intimate partner violence    Fear of current or ex partner: Not on file    Emotionally abused: Not on file    Physically abused: Not on file    Forced sexual activity: Not on file  Other Topics Concern  . Not on file  Social History Narrative  . Not on file    Outpatient Medications Prior to Visit  Medication Sig Dispense Refill  . butalbital-acetaminophen-caffeine (FIORICET, ESGIC) 50-325-40 MG tablet Take 1-2 tablets by mouth every 6 (six)  hours as needed for headache. MAXIMUM USE: 6 capsules in 24 hours, use no more than 3 days per month 15 tablet 0  . FLUoxetine (PROZAC) 10 MG capsule Take 1 capsule (10 mg total) by mouth daily. 90 capsule 1  . levonorgestrel (MIRENA) 20 MCG/24HR IUD 1 each by Intrauterine route once.    . propranolol ER (INDERAL LA) 60 MG 24 hr capsule Take 1 capsule (60 mg total) by mouth daily. 90 capsule 1   No facility-administered medications prior to visit.     No Known Allergies  ROS     Objective:    Physical Exam  Musculoskeletal:     Comments: Left elbow with no erythema or induration or rash.  She is tender over the lateral epicondyle.  She had pain with pronation and supination against resistance but more pain with supination.  Otherwise normal range of motion of the elbow wrist and fingers.  No pain with bicep flexion.    BP 126/69   Pulse 66   Ht 5\' 5"  (1.651 m)   Wt 231 lb (104.8 kg)   SpO2 100%   BMI 38.44 kg/m  Wt  Readings from Last 3 Encounters:  02/27/19 231 lb (104.8 kg)  11/18/18 230 lb (104.3 kg)  09/02/18 224 lb 6.4 oz (101.8 kg)    Health Maintenance Due  Topic Date Due  . HIV Screening  10/29/2005  . TETANUS/TDAP  02/05/2019    There are no preventive care reminders to display for this patient.   Lab Results  Component Value Date   TSH 0.907 02/11/2015   Lab Results  Component Value Date   WBC 6.3 01/16/2011   HGB 14.5 01/16/2011   HCT 44.8 01/16/2011   MCV 82.4 01/16/2011   PLT 257 01/16/2011   Lab Results  Component Value Date   NA 138 12/31/2016   K 4.1 12/31/2016   CO2 22 12/31/2016   GLUCOSE 85 12/31/2016   BUN 8 12/31/2016   CREATININE 0.71 12/31/2016   BILITOT 0.4 12/31/2016   ALKPHOS 77 12/31/2016   AST 17 12/31/2016   ALT 15 12/31/2016   PROT 7.4 12/31/2016   ALBUMIN 4.4 12/31/2016   CALCIUM 9.7 12/31/2016   Lab Results  Component Value Date   CHOL 182 12/31/2016   Lab Results  Component Value Date   HDL 44 (L) 12/31/2016   Lab Results  Component Value Date   LDLCALC 110 (H) 12/31/2016   Lab Results  Component Value Date   TRIG 138 12/31/2016   Lab Results  Component Value Date   CHOLHDL 4.1 12/31/2016   No results found for: HGBA1C     Assessment & Plan:   Problem List Items Addressed This Visit    None    Visit Diagnoses    Need for tetanus, diphtheria, and acellular pertussis (Tdap) vaccine in patient of adolescent age or older    -  Primary   Relevant Orders   Tdap vaccine greater than or equal to 7yo IM (Completed)   Right elbow pain       Right lateral epicondylitis         Right elbow pain-most consistent with lateral epicondylitis.  Discussed diagnosis and treatment.  Avoid heavy lifting.  Recommend Aleve or ibuprofen she has Aleve at home.  Also recommend an elbow strap is can be found over-the-counter.  Offered her to also send over a prescription anti-inflammatory if needed she can let me know.  Also given exercises to  do  on her own at home.  If not improving over the next 3 weeks then please let us know and we will schedule for an injection if needed. Call if not improving over the last 3 weeks.   No orders of the defined types were placed in this encounter.    Nani Gasseratherine , MD

## 2019-04-03 ENCOUNTER — Ambulatory Visit: Payer: 59 | Admitting: Family Medicine

## 2019-04-03 ENCOUNTER — Other Ambulatory Visit: Payer: Self-pay

## 2019-04-03 ENCOUNTER — Encounter: Payer: Self-pay | Admitting: Family Medicine

## 2019-04-03 VITALS — BP 126/72 | HR 76 | Ht 65.0 in | Wt 236.0 lb

## 2019-04-03 DIAGNOSIS — M7711 Lateral epicondylitis, right elbow: Secondary | ICD-10-CM | POA: Diagnosis not present

## 2019-04-03 DIAGNOSIS — M25521 Pain in right elbow: Secondary | ICD-10-CM

## 2019-04-03 NOTE — Progress Notes (Signed)
Acute Office Visit  Subjective:    Patient ID: Sarah Gonzales, female    DOB: 12/01/1990, 28 y.o.   MRN: [id number]  Chief Complaint  Patient presents with  . Arm Pain    HPI Patient is in today for right elbow pain.  I actually saw her about 4-1/2 weeks ago and diagnosed her with right lateral epicondylitis.  She tried doing the exercises she was wearing the strap and trying to avoid excessive use of that elbow but says it just still continues to hurt and bother her.  Then earlier this week she actually fell outside with her arm outstretched and really jarred her elbow and so it is really been bothering her since then.  We discussed possibly doing an injection if she did not improve.  Past Medical History:  Diagnosis Date  . Anxiety   . Bartholin cyst   . Chronic headaches    Since the age of 71, severe in the past.  . Hypertension   . Warts     History reviewed. No pertinent surgical history.  Family History  Problem Relation Age of Onset  . Hypertension Mother   . Hypertension Father   . Sleep apnea Father   . Asthma Unknown        aunt  . Bipolar disorder Maternal Aunt   . Hypertension Maternal Grandfather   . Lung cancer Maternal Grandfather   . Hypertension Maternal Grandmother   . Hypertension Paternal Grandfather   . Hypertension Paternal Grandmother     Social History   Socioeconomic History  . Marital status: Single    Spouse name: Not on file  . Number of children: Not on file  . Years of education: Not on file  . Highest education level: Not on file  Occupational History  . Occupation: Engineering geologist Needs  . Financial resource strain: Not on file  . Food insecurity    Worry: Not on file    Inability: Not on file  . Transportation needs    Medical: Not on file    Non-medical: Not on file  Tobacco Use  . Smoking status: Never Smoker  . Smokeless tobacco: Never Used  Substance and Sexual Activity  . Alcohol use: Yes    Alcohol/week: 2.0  standard drinks    Types: 2 Standard drinks or equivalent per week    Comment: socially  . Drug use: No  . Sexual activity: Yes    Partners: Male    Birth control/protection: I.U.D.    Comment: lives with family, student at [employer], involved in drama  Lifestyle  . Physical activity    Days per week: Not on file    Minutes per session: Not on file  . Stress: Not on file  Relationships  . Social Musician on phone: Not on file    Gets together: Not on file    Attends religious service: Not on file    Active member of club or organization: Not on file    Attends meetings of clubs or organizations: Not on file    Relationship status: Not on file  . Intimate partner violence    Fear of current or ex partner: Not on file    Emotionally abused: Not on file    Physically abused: Not on file    Forced sexual activity: Not on file  Other Topics Concern  . Not on file  Social History Narrative  . Not on file    Outpatient  Medications Prior to Visit  Medication Sig Dispense Refill  . butalbital-acetaminophen-caffeine (FIORICET, ESGIC) 50-325-40 MG tablet Take 1-2 tablets by mouth every 6 (six) hours as needed for headache. MAXIMUM USE: 6 capsules in 24 hours, use no more than 3 days per month 15 tablet 0  . FLUoxetine (PROZAC) 10 MG capsule Take 1 capsule (10 mg total) by mouth daily. 90 capsule 1  . levonorgestrel (MIRENA) 20 MCG/24HR IUD 1 each by Intrauterine route once.    . propranolol ER (INDERAL LA) 60 MG 24 hr capsule Take 1 capsule (60 mg total) by mouth daily. 90 capsule 1   No facility-administered medications prior to visit.     No Known Allergies  ROS     Objective:    Physical Exam  Constitutional: She is oriented to person, place, and time. She appears well-developed and well-nourished.  HENT:  Head: Normocephalic and atraumatic.  Eyes: Conjunctivae and EOM are normal.  Cardiovascular: Normal rate.  Pulmonary/Chest: Effort normal.  Musculoskeletal:      Comments: Right elbow with normal range of motion though she does have discomfort with full extension.  She is very tender over the right lateral epicondyle.  Neurological: She is alert and oriented to person, place, and time.  Skin: Skin is dry. No pallor.  No rash or erythema.  Psychiatric: She has a normal mood and affect. Her behavior is normal.  Vitals reviewed.   BP 126/72   Pulse 76   Ht 5\' 5"  (1.651 m)   Wt 236 lb (107 kg)   SpO2 99%   BMI 39.27 kg/m  Wt Readings from Last 3 Encounters:  04/03/19 236 lb (107 kg)  02/27/19 231 lb (104.8 kg)  11/18/18 230 lb (104.3 kg)    Health Maintenance Due  Topic Date Due  . HIV Screening  10/29/2005  . INFLUENZA VACCINE  02/28/2019    There are no preventive care reminders to display for this patient.   Lab Results  Component Value Date   TSH 0.907 02/11/2015   Lab Results  Component Value Date   WBC 6.3 01/16/2011   HGB 14.5 01/16/2011   HCT 44.8 01/16/2011   MCV 82.4 01/16/2011   PLT 257 01/16/2011   Lab Results  Component Value Date   NA 138 12/31/2016   K 4.1 12/31/2016   CO2 22 12/31/2016   GLUCOSE 85 12/31/2016   BUN 8 12/31/2016   CREATININE 0.71 12/31/2016   BILITOT 0.4 12/31/2016   ALKPHOS 77 12/31/2016   AST 17 12/31/2016   ALT 15 12/31/2016   PROT 7.4 12/31/2016   ALBUMIN 4.4 12/31/2016   CALCIUM 9.7 12/31/2016   Lab Results  Component Value Date   CHOL 182 12/31/2016   Lab Results  Component Value Date   HDL 44 (L) 12/31/2016   Lab Results  Component Value Date   LDLCALC 110 (H) 12/31/2016   Lab Results  Component Value Date   TRIG 138 12/31/2016   Lab Results  Component Value Date   CHOLHDL 4.1 12/31/2016   No results found for: HGBA1C     Assessment & Plan:   Problem List Items Addressed This Visit    None    Visit Diagnoses    Right elbow pain    -  Primary   Right lateral epicondylitis         Discussed options including possible injection and possible formal  physical therapy.  Injection performed today.  Patient tolerated well.  If in the next  couple weeks not improving then please let us know.  Aspiration/Injection Procedure Note [name] [id number] 05/08/1991  Procedure: Injection Indications: Right lateral epicondylitis  Procedure Details Consent: Risks of procedure as well as the alternatives and risks of each were explained to the (patient/caregiver).  Consent for procedure obtained. Time Out: Verified patient identification, verified procedure, site/side was marked, verified correct patient position, special equipment/implants available, medications/allergies/relevent history reviewed, required imaging and test results available.  Performed   Local Anesthesia Used:Ethyl Chloride Spray Amount of Fluid Aspirated: none Injection: 1 cc of 1% lidocaine without epi and 1 cc of 40 mg Kenalog A sterile dressing was applied.  Patient did tolerate procedure well. Estimated blood loss: none  Nani GasserCatherine Elga Santy 04/03/2019, 1:46 PM   No orders of the defined types were placed in this encounter.    Nani Gasseratherine Noela Brothers, MD

## 2019-06-10 ENCOUNTER — Encounter: Payer: Self-pay | Admitting: Family Medicine

## 2019-06-10 ENCOUNTER — Ambulatory Visit (INDEPENDENT_AMBULATORY_CARE_PROVIDER_SITE_OTHER): Payer: 59 | Admitting: Family Medicine

## 2019-06-10 VITALS — Temp 97.9°F | Wt 235.7 lb

## 2019-06-10 DIAGNOSIS — F320 Major depressive disorder, single episode, mild: Secondary | ICD-10-CM

## 2019-06-10 MED ORDER — FLUOXETINE HCL 20 MG PO CAPS: ORAL_CAPSULE | ORAL | 1 refills | Status: DC

## 2019-06-10 NOTE — Progress Notes (Signed)
Virtual Visit via Video Note  I connected with Sarah Gonzales on 06/10/19 at 11:10 AM EST by a video enabled telemedicine application and verified that I am speaking with the correct person using two identifiers.   I discussed the limitations of evaluation and management by telemedicine and the availability of in person appointments. The patient expressed understanding and agreed to proceed.  Subjective:    CC: Mood.   HPI: 28 year old female with a history depression is doing a virtual visit today for shift in mood.  She reports that she has no motivation, and no energy. this has been going on x3 weeks.  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  She denies any other major stressors in regards to work or home life her family etc.  She just noticed that she is feeling more irritable and becoming more rude at times.  She also reports the last 2 weeks she is really not been sleeping well.  She normally gets about 7 hours and more recently she is only been getting 4 to 5 hours. She has been trying to reach out and call friends and family and she feels that does help her feel better.  She is currently on fluoxetine 10 mg daily. We had decreased her dose back in March and April.     Past medical history, Surgical history, Family history not pertinant except as noted below, Social history, Allergies, and medications have been entered into the medical record, reviewed, and corrections made.   Review of Systems: No fevers, chills, night sweats, weight loss, chest pain, or shortness of breath.   Objective:    General: Speaking clearly in complete sentences without any shortness of breath.  Alert and oriented x3.  Normal judgment. No apparent acute distress.    Impression and Recommendations:   Depression -significant bump up in her PHQ-9 score which  was 15 today and GAD-7 score which was 10.  Previous scores were 0.  So there is definitely been a major shift in her mood over the last 6 months but particularly over the last 3 weeks.  Again she did transition job slightly but she does not feel like that is enough stress that it really should be causing her symptoms.  We discussed increasing her fluoxetine back to 20 mg for 10 days and then considering going up to 40 mg at that point if needed.  Plan will be for [name] to follow back up in 3 weeks to make sure that she is improving.  Since she is more extroverted and finds a lot of energy from being around and talking other people did encourage her to try to reach out.     I discussed the assessment and treatment plan with the patient. The patient was provided an opportunity to ask questions and all were answered. The patient agreed with the plan and demonstrated an understanding of the instructions.   The patient was advised to call back or seek an in-person evaluation if the symptoms worsen or if the condition fails to improve as anticipated.   Beatrice Lecher, MD

## 2019-06-10 NOTE — Progress Notes (Signed)
Prev PHQ =0 /GAD =2 (not difficult)

## 2019-07-02 ENCOUNTER — Other Ambulatory Visit: Payer: Self-pay

## 2019-07-02 ENCOUNTER — Encounter: Payer: Self-pay | Admitting: Obstetrics and Gynecology

## 2019-07-02 ENCOUNTER — Ambulatory Visit (INDEPENDENT_AMBULATORY_CARE_PROVIDER_SITE_OTHER): Payer: 59 | Admitting: Obstetrics and Gynecology

## 2019-07-02 ENCOUNTER — Ambulatory Visit: Payer: 59 | Admitting: Family Medicine

## 2019-07-02 VITALS — BP 128/68 | HR 79 | Resp 16 | Ht 65.0 in | Wt 238.0 lb

## 2019-07-02 DIAGNOSIS — N898 Other specified noninflammatory disorders of vagina: Secondary | ICD-10-CM | POA: Diagnosis not present

## 2019-07-02 DIAGNOSIS — R102 Pelvic and perineal pain: Secondary | ICD-10-CM

## 2019-07-02 DIAGNOSIS — Z01419 Encounter for gynecological examination (general) (routine) without abnormal findings: Secondary | ICD-10-CM

## 2019-07-02 DIAGNOSIS — Z124 Encounter for screening for malignant neoplasm of cervix: Secondary | ICD-10-CM | POA: Diagnosis not present

## 2019-07-02 NOTE — Progress Notes (Signed)
Subjective:     Sarah Gonzales is a 28 y.o. female P0 with BMI 39 who is here for a comprehensive physical exam. The patient reports some occasional lower abdominal cramping over the past few months. Patient has had the IUD 2017. She reports some irregular light bleeding. She is sexually active without complaints. She denies dysuria, urgency or hesitancy. She reports the presence of a vaginal odor recently. She denies any abnormal discharge. Patient is also contemplating conceiving in the next year or so.  Past Medical History:  Diagnosis Date  . Anxiety   . Bartholin cyst   . 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  . Hypertension   . Warts    No past surgical history on file. Family History  Problem Relation Age of Onset  . Hypertension Mother   . Hypertension Father   . Sleep apnea Father   . Asthma Unknown        aunt  . Bipolar disorder Maternal Aunt   . Hypertension Maternal Grandfather   . Lung cancer Maternal Grandfather   . Hypertension Maternal Grandmother   . Hypertension Paternal Grandfather   . Hypertension Paternal Grandmother     Social History   Socioeconomic History  . Marital status: Single    Spouse name: Not on file  . Number of children: Not on file  . Years of education: Not on file  . Highest education level: Not on file  Occupational History  . Occupation: Engineering geologist Needs  . Financial resource strain: Not on file  . Food insecurity    Worry: Not on file    Inability: Not on file  . Transportation needs    Medical: Not on file    Non-medical: Not on file  Tobacco Use  . Smoking status: Never Smoker  . Smokeless tobacco: Never Used  Substance and Sexual Activity  . Alcohol use: Yes    Alcohol/week: 2.0 standard drinks    Types: 2 Standard drinks or equivalent per week    Comment: socially  . Drug use: No  . Sexual activity: Yes    Partners: Male    Birth control/protection: I.U.D.    Comment: lives with family,  student at [employer], involved in drama  Lifestyle  . Physical activity    Days per week: Not on file    Minutes per session: Not on file  . Stress: Not on file  Relationships  . Social Musician on phone: Not on file    Gets together: Not on file    Attends religious service: Not on file    Active member of club or organization: Not on file    Attends meetings of clubs or organizations: Not on file    Relationship status: Not on file  . Intimate partner violence    Fear of current or ex partner: Not on file    Emotionally abused: Not on file    Physically abused: Not on file    Forced sexual activity: Not on file  Other Topics Concern  . Not on file  Social History Narrative  . Not on file   Health Maintenance  Topic Date Due  . HIV Screening  10/29/2005  . INFLUENZA VACCINE  10/28/2019 (Originally 02/28/2019)  . PAP-Cervical Cytology Screening  06/23/2021  . PAP SMEAR-Modifier  06/23/2021  . TETANUS/TDAP  02/26/2029       Review of Systems Pertinent items noted in HPI and  remainder of comprehensive ROS otherwise negative.   Objective:  Blood pressure 128/68, pulse 79, resp. rate 16, height 5\' 5"  (1.651 m), weight 238 lb (108 kg).     GENERAL: Well-developed, well-nourished female in no acute distress.  HEENT: Normocephalic, atraumatic. Sclerae anicteric.  NECK: Supple. Normal thyroid.  LUNGS: Clear to auscultation bilaterally.  HEART: Regular rate and rhythm. BREASTS: Symmetric in size. No palpable masses or lymphadenopathy, skin changes, or nipple drainage. ABDOMEN: Soft, nontender, nondistended. No organomegaly. PELVIC: Normal external female genitalia. Vagina is pink and rugated.  Normal discharge. Normal appearing cervix with IUD strings visualized. Uterus is normal in size. No adnexal mass or tenderness. EXTREMITIES: No cyanosis, clubbing, or edema, 2+ distal pulses.    Assessment:    Healthy female exam.      Plan:    Pap smear collected Wet prep  collected A1c collected  Patient will be contacted with abnormal results Pelvic ultrasound to assess position of IUD if wet prep is negative has been ordered Discussed weight loss in anticipation of pregnancy See After Visit Summary for Counseling Recommendations

## 2019-07-03 LAB — HEMOGLOBIN A1C
Hgb A1c MFr Bld: 5.2 % of total Hgb (ref <5.7)
Mean Plasma Glucose: 103 (calc)
eAG (mmol/L): 5.7 (calc)

## 2019-07-03 LAB — CYTOLOGY - PAP: Diagnosis: NEGATIVE

## 2019-07-06 LAB — CERVICOVAGINAL ANCILLARY ONLY
Bacterial Vaginitis (gardnerella): NEGATIVE
Candida Glabrata: NEGATIVE
Candida Vaginitis: NEGATIVE
Comment: NEGATIVE
Comment: NEGATIVE
Comment: NEGATIVE

## 2019-07-09 ENCOUNTER — Other Ambulatory Visit: Payer: 59

## 2019-07-09 ENCOUNTER — Ambulatory Visit (INDEPENDENT_AMBULATORY_CARE_PROVIDER_SITE_OTHER): Payer: 59 | Admitting: Family Medicine

## 2019-07-09 ENCOUNTER — Encounter: Payer: Self-pay | Admitting: Family Medicine

## 2019-07-09 VITALS — Ht 65.0 in | Wt 238.0 lb

## 2019-07-09 DIAGNOSIS — M545 Low back pain, unspecified: Secondary | ICD-10-CM | POA: Insufficient documentation

## 2019-07-09 DIAGNOSIS — G8929 Other chronic pain: Secondary | ICD-10-CM

## 2019-07-09 DIAGNOSIS — F320 Major depressive disorder, single episode, mild: Secondary | ICD-10-CM

## 2019-07-09 MED ORDER — FLUOXETINE HCL 40 MG PO CAPS: 40.0000 mg | ORAL_CAPSULE | Freq: Every day | ORAL | 0 refills | Status: DC
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
## 2019-07-09 NOTE — Assessment & Plan Note (Signed)
Discussed options.  We will try increasing the fluoxetine to 40 mg she has noticed some mild improvement but still having a lot of apathy.  If we go up to 40 mg for 6 weeks and she still not noticing significant improvement then will consider changing medications.  Also discussed the option of therapy/counseling but she is not interested at this time.  She is also hoping that working on both full-time starting in January will also help.

## 2019-07-09 NOTE — Progress Notes (Signed)
Pt feels that she is doing well on current regimen. She is taking  Fluoxetine 40 mg.  She stated that she feels more positive but still has no motivation. She has started a new job.

## 2019-07-09 NOTE — Assessment & Plan Note (Addendum)
Chronic back pain.  She is actually never had this assessed or even discussed with physician.  Since it has been going on for years we discussed at least getting a baseline x-ray to take a look.  It sounds like a lot of it is musculoskeletal and she would probably benefit from formal physical therapy at some point particularly if her more acute pain persists.  She has never tried chiropractic care or massage therapy which could also be helpful long-term.  Will call with those results once available and give her options to improve her pain.  At some point I would also like to do an exam on her.

## 2019-07-10 ENCOUNTER — Ambulatory Visit (INDEPENDENT_AMBULATORY_CARE_PROVIDER_SITE_OTHER): Payer: 59

## 2019-07-10 ENCOUNTER — Other Ambulatory Visit: Payer: Self-pay

## 2019-07-10 DIAGNOSIS — M545 Low back pain: Secondary | ICD-10-CM

## 2019-07-10 DIAGNOSIS — R102 Pelvic and perineal pain: Secondary | ICD-10-CM | POA: Diagnosis not present

## 2019-07-10 DIAGNOSIS — G8929 Other chronic pain: Secondary | ICD-10-CM

## 2019-08-04 ENCOUNTER — Other Ambulatory Visit: Payer: Self-pay | Admitting: Family Medicine

## 2019-08-04 DIAGNOSIS — F320 Major depressive disorder, single episode, mild: Secondary | ICD-10-CM

## 2019-08-27 MED ORDER — PROPRANOLOL HCL ER 60 MG PO CP24: 60.0000 mg | ORAL_CAPSULE | Freq: Every day | ORAL | 1 refills | Status: DC

## 2019-08-27 MED ORDER — FLUOXETINE HCL 40 MG PO CAPS: 40.0000 mg | ORAL_CAPSULE | Freq: Every day | ORAL | 1 refills | Status: DC

## 2019-08-27 NOTE — Addendum Note (Signed)
Addended by: Arvilla Market on: 08/27/2019 08:53 AM   Modules accepted: Orders

## 2019-12-30 ENCOUNTER — Other Ambulatory Visit: Payer: Self-pay | Admitting: Family Medicine

## 2020-01-05 ENCOUNTER — Encounter: Payer: Self-pay | Admitting: Family Medicine

## 2020-01-12 ENCOUNTER — Ambulatory Visit: Payer: 59 | Admitting: Medical-Surgical

## 2020-03-11 ENCOUNTER — Other Ambulatory Visit: Payer: Self-pay | Admitting: Family Medicine

## 2020-03-11 DIAGNOSIS — F320 Major depressive disorder, single episode, mild: Secondary | ICD-10-CM

## 2020-03-15 ENCOUNTER — Ambulatory Visit: Payer: 59 | Admitting: Family Medicine

## 2020-03-15 ENCOUNTER — Encounter: Payer: Self-pay | Admitting: Family Medicine

## 2020-03-15 ENCOUNTER — Other Ambulatory Visit: Payer: Self-pay

## 2020-03-15 VITALS — BP 113/81 | HR 86 | Ht 65.0 in | Wt 237.0 lb

## 2020-03-15 DIAGNOSIS — Z13220 Encounter for screening for lipoid disorders: Secondary | ICD-10-CM | POA: Diagnosis not present

## 2020-03-15 DIAGNOSIS — E669 Obesity, unspecified: Secondary | ICD-10-CM

## 2020-03-15 DIAGNOSIS — Z1159 Encounter for screening for other viral diseases: Secondary | ICD-10-CM

## 2020-03-15 DIAGNOSIS — G43009 Migraine without aura, not intractable, without status migrainosus: Secondary | ICD-10-CM

## 2020-03-15 DIAGNOSIS — I10 Essential (primary) hypertension: Secondary | ICD-10-CM | POA: Diagnosis not present

## 2020-03-15 DIAGNOSIS — F320 Major depressive disorder, single episode, mild: Secondary | ICD-10-CM

## 2020-03-15 MED ORDER — FLUOXETINE HCL 40 MG PO CAPS: 40.0000 mg | ORAL_CAPSULE | Freq: Every day | ORAL | 3 refills | Status: DC

## 2020-03-15 MED ORDER — PROPRANOLOL HCL ER 60 MG PO CP24: 60.0000 mg | ORAL_CAPSULE | Freq: Every day | ORAL | 3 refills | Status: DC

## 2020-03-15 MED ORDER — FLUOXETINE HCL 20 MG PO TABS: 40.0000 mg | ORAL_TABLET | Freq: Every day | ORAL | 0 refills | Status: DC

## 2020-03-15 MED ORDER — ELETRIPTAN HYDROBROMIDE 20 MG PO TABS
20.0000 mg | ORAL_TABLET | ORAL | 1 refills | Status: DC | PRN
Start: 2020-03-15 — End: 2020-12-14

## 2020-03-15 NOTE — Assessment & Plan Note (Signed)
Congratulated her on great job with her weight loss!! Keep up the  [employer] work.

## 2020-03-15 NOTE — Assessment & Plan Note (Signed)
Well controlled. Continue current regimen. Follow up in  6 mo. Great job on weight loss.

## 2020-03-15 NOTE — Progress Notes (Signed)
Pt reports that she is doing well on current regimen.  

## 2020-03-15 NOTE — Progress Notes (Signed)
Established Patient Office Visit  Subjective:  Patient ID: Sarah Gonzales, female    DOB: 06/10/1991  Age: 29 y.o. MRN: [id number]  CC:  Chief Complaint  Patient presents with  . Hypertension  . mood    HPI Sarah Gonzales presents for   Hypertension- Pt denies chest pain, SOB, dizziness, or heart palpitations.  Taking meds as directed w/o problems.  Denies medication side effects.    Also for the last month she has been dieting she has been only eating things that grow out of the ground she has been trying to avoid a lot of sugars and processed foods and says so far is actually lost about 9 pounds.  She says she is not being super super strict but is really trying to make some lifestyle changes she is also been walking daily for exercise.  Follow-up mood-she is currently on fluoxetine 40 mg capsule she said she was noticing that when she would take it before breakfast she would get severe heartburn.  When she would take it after breakfast she did not seem to have any symptoms.  She did want to mention that today but she is otherwise tolerating it well and is happy with her regimen.  She reports that her migraines have actually been well controlled on the propranolol.  She is only getting 1 headache every 2 months on average.  She just mostly uses ibuprofen for rescue but is interested in another medication and it sounds like at some point in time she was on butalbital and that actually was very effective.  She has never tried a triptan.  Past Medical History:  Diagnosis Date  . Anxiety   . Bartholin cyst   . Chronic headaches    Since the age of 29, severe in the past.  . Hypertension   . Warts     History reviewed. No pertinent surgical history.  Family History  Problem Relation Age of Onset  . Hypertension Mother   . Hypertension Father   . Sleep apnea Father   . Asthma Other        aunt  . Bipolar disorder Maternal Aunt   . Hypertension Maternal Grandfather   . Lung  cancer Maternal Grandfather   . Hypertension Maternal Grandmother   . Hypertension Paternal Grandfather   . Hypertension Paternal Grandmother     Social History   Socioeconomic History  . Marital status: Single    Spouse name: Not on file  . Number of children: Not on file  . Years of education: Not on file  . Highest education level: Not on file  Occupational History  . Occupation: server  Tobacco Use  . Smoking status: Never Smoker  . Smokeless tobacco: Never Used  Vaping Use  . Vaping Use: Never used  Substance and Sexual Activity  . Alcohol use: Yes    Alcohol/week: 2.0 standard drinks    Types: 2 Standard drinks or equivalent per week    Comment: socially  . Drug use: No  . Sexual activity: Yes    Partners: Male    Birth control/protection: I.U.D.    Comment: lives with family, student at [employer], involved in drama  Other Topics Concern  . Not on file  Social History Narrative  . Not on file   Social Determinants of Health   Financial Resource Strain:   . Difficulty of Paying Living Expenses:   Food Insecurity:   . Worried About Programme researcher, broadcasting/film/videounning Out of Food in the Last Year:   .  Ran Out of Food in the Last Year:   Transportation Needs:   . Freight forwarder (Medical):   Marland Kitchen Lack of Transportation (Non-Medical):   Physical Activity:   . Days of Exercise per Week:   . Minutes of Exercise per Session:   Stress:   . Feeling of Stress :   Social Connections:   . Frequency of Communication with Friends and Family:   . Frequency of Social Gatherings with Friends and Family:   . Attends Religious Services:   . Active Member of Clubs or Organizations:   . Attends Banker Meetings:   Marland Kitchen Marital Status:   Intimate Partner Violence:   . Fear of Current or Ex-Partner:   . Emotionally Abused:   Marland Kitchen Physically Abused:   . Sexually Abused:     Outpatient Medications Prior to Visit  Medication Sig Dispense Refill  . levonorgestrel (MIRENA) 20 MCG/24HR IUD 1  each by Intrauterine route once.    Marland Kitchen FLUoxetine (PROZAC) 40 MG capsule TAKE 1 CAPSULE BY MOUTH  DAILY 90 capsule 0  . propranolol ER (INDERAL LA) 60 MG 24 hr capsule Take 1 capsule (60 mg total) by mouth daily. PLEASE CALL OFFICE AND SCHEDULE AN APPOINTMENT FOR FUTURE REFILLS 90 capsule 0   No facility-administered medications prior to visit.    No Known Allergies  ROS Review of Systems    Objective:    Physical Exam Constitutional:      Appearance: She is well-developed.  HENT:     Head: Normocephalic and atraumatic.  Cardiovascular:     Rate and Rhythm: Normal rate and regular rhythm.     Heart sounds: Normal heart sounds.  Pulmonary:     Effort: Pulmonary effort is normal.     Breath sounds: Normal breath sounds.  Skin:    General: Skin is warm and dry.  Neurological:     Mental Status: She is alert and oriented to person, place, and time.  Psychiatric:        Behavior: Behavior normal.     BP 113/81   Pulse 86   Ht 5\' 5"  (1.651 m)   Wt 237 lb (107.5 kg)   SpO2 100%   BMI 39.44 kg/m  Wt Readings from Last 3 Encounters:  03/15/20 237 lb (107.5 kg)  07/09/19 238 lb (108 kg)  07/02/19 238 lb (108 kg)     Health Maintenance Due  Topic Date Due  . Hepatitis C Screening  Never done  . INFLUENZA VACCINE  02/28/2020    There are no preventive care reminders to display for this patient.  Lab Results  Component Value Date   TSH 0.907 02/11/2015   Lab Results  Component Value Date   WBC 6.3 01/16/2011   HGB 14.5 01/16/2011   HCT 44.8 01/16/2011   MCV 82.4 01/16/2011   PLT 257 01/16/2011   Lab Results  Component Value Date   NA 138 12/31/2016   K 4.1 12/31/2016   CO2 22 12/31/2016   GLUCOSE 85 12/31/2016   BUN 8 12/31/2016   CREATININE 0.71 12/31/2016   BILITOT 0.4 12/31/2016   ALKPHOS 77 12/31/2016   AST 17 12/31/2016   ALT 15 12/31/2016   PROT 7.4 12/31/2016   ALBUMIN 4.4 12/31/2016   CALCIUM 9.7 12/31/2016   Lab Results  Component Value  Date   CHOL 182 12/31/2016   Lab Results  Component Value Date   HDL 44 (L) 12/31/2016   Lab Results  Component Value Date  LDLCALC 110 (H) 12/31/2016   Lab Results  Component Value Date   TRIG 138 12/31/2016   Lab Results  Component Value Date   CHOLHDL 4.1 12/31/2016   Lab Results  Component Value Date   HGBA1C 5.2 07/02/2019      Assessment & Plan:   Problem List Items Addressed This Visit      Cardiovascular and Mediastinum   Migraine headache    Well controlled on Propranolol. Recommend trial of tryptan.  Will try relpax.  Continue current regimen. Follow up in  6 mo      Relevant Medications   FLUoxetine (PROZAC) 40 MG capsule   propranolol ER (INDERAL LA) 60 MG 24 hr capsule   eletriptan (RELPAX) 20 MG tablet   Hypertension - Primary    Well controlled. Continue current regimen. Follow up in  6 mo. Great job on weight loss.      Relevant Medications   propranolol ER (INDERAL LA) 60 MG 24 hr capsule   Other Relevant Orders   COMPLETE METABOLIC PANEL WITH GFR   Lipid Panel w/reflex Direct LDL   Hepatitis C antibody     Other   Obesity, Class II, BMI 35-39.9    Congratulated her on great job with her weight loss!! Keep up the  [employer] work.       Depression    Doing well on 40mg  fluoxetine but capsule causing nausea and heart burn. Will try the tab instead so will have to do 2 of the 20mg  tabs. F/U in 6 mo.      Depression screen [facility] 2/9 03/15/2020 07/09/2019 06/10/2019  Decreased Interest 0 3 3  Down, Depressed, Hopeless 1 2 3   PHQ - 2 Score 1 5 6   Altered sleeping 0 1 2  Tired, decreased energy 0 3 3  Change in appetite 0 2 1  Feeling bad or failure about yourself  0 2 2  Trouble concentrating 0 1 0  Moving slowly or fidgety/restless 0 0 0  Suicidal thoughts 0 1 1  PHQ-9 Score 1 15 15   Difficult doing work/chores Not difficult at all Very difficult Extremely dIfficult  Some encounter information is confidential and restricted. Go to Review  Flowsheets activity to see all data.  Some recent data might be hidden         Relevant Medications   FLUoxetine (PROZAC) 40 MG capsule    Other Visit Diagnoses    Screening, lipid       Relevant Orders   COMPLETE METABOLIC PANEL WITH GFR   Lipid Panel w/reflex Direct LDL   Hepatitis C antibody   Encounter for hepatitis C screening test for low risk patient       Relevant Orders   COMPLETE METABOLIC PANEL WITH GFR   Hepatitis C antibody      Meds ordered this encounter  Medications  . FLUoxetine (PROZAC) 40 MG capsule    Sig: Take 1 capsule (40 mg total) by mouth daily.    Dispense:  90 capsule    Refill:  3    DO NOT SEND NEXT REFILL UNTIL 05/30/2020  . propranolol ER (INDERAL LA) 60 MG 24 hr capsule    Sig: Take 1 capsule (60 mg total) by mouth daily.    Dispense:  90 capsule    Refill:  3    DO NOT SEND NEXT REFILL UNTIL 03/23/2020  . DISCONTD: FLUoxetine (PROZAC) 20 MG tablet    Sig: Take 2 tablets (40 mg total) by mouth  daily.    Dispense:  60 tablet    Refill:  0  . eletriptan (RELPAX) 20 MG tablet    Sig: Take 1 tablet (20 mg total) by mouth as needed for migraine or headache. May repeat in 2 hours if headache persists or recurs.    Dispense:  10 tablet    Refill:  1    Follow-up: Return in about 6 months (around 09/15/2020) for Hypertension and migraines.    Nani Gasser, MD

## 2020-03-15 NOTE — Assessment & Plan Note (Addendum)
Well controlled on Propranolol. Recommend trial of tryptan.  Will try relpax.  Continue current regimen. Follow up in  6 mo

## 2020-03-15 NOTE — Assessment & Plan Note (Addendum)
Doing well on 40mg  fluoxetine but capsule causing nausea and heart burn. Will try the tab instead so will have to do 2 of the 20mg  tabs. F/U in 6 mo.      Depression screen [facility] 2/9 03/15/2020 07/09/2019 06/10/2019  Decreased Interest 0 3 3  Down, Depressed, Hopeless 1 2 3   PHQ - 2 Score 1 5 6   Altered sleeping 0 1 2  Tired, decreased energy 0 3 3  Change in appetite 0 2 1  Feeling bad or failure about yourself  0 2 2  Trouble concentrating 0 1 0  Moving slowly or fidgety/restless 0 0 0  Suicidal thoughts 0 1 1  PHQ-9 Score 1 15 15   Difficult doing work/chores Not difficult at all Very difficult Extremely dIfficult  Some encounter information is confidential and restricted. Go to Review Flowsheets activity to see all data.  Some recent data might be hidden

## 2020-03-21 LAB — COMPLETE METABOLIC PANEL WITH GFR
AG Ratio: 1.9 (calc) (ref 1.0–2.5)
ALT: 24 U/L (ref 6–29)
AST: 24 U/L (ref 10–30)
Albumin: 4.5 g/dL (ref 3.6–5.1)
Alkaline phosphatase (APISO): 78 U/L (ref 31–125)
BUN: 14 mg/dL (ref 7–25)
CO2: 25 mmol/L (ref 20–32)
Calcium: 9.1 mg/dL (ref 8.6–10.2)
Chloride: 104 mmol/L (ref 98–110)
Creat: 0.66 mg/dL (ref 0.50–1.10)
GFR, Est African American: 138 mL/min/{1.73_m2} (ref >=60)
GFR, Est Non African American: 119 mL/min/{1.73_m2} (ref >=60)
Globulin: 2.4 g/dL (calc) (ref 1.9–3.7)
Glucose, Bld: 87 mg/dL (ref 65–99)
Potassium: 3.7 mmol/L (ref 3.5–5.3)
Sodium: 138 mmol/L (ref 135–146)
Total Bilirubin: 0.5 mg/dL (ref 0.2–1.2)
Total Protein: 6.9 g/dL (ref 6.1–8.1)

## 2020-03-21 LAB — LIPID PANEL W/REFLEX DIRECT LDL
Cholesterol: 206 mg/dL — ABNORMAL HIGH (ref <200)
HDL: 36 mg/dL — ABNORMAL LOW (ref >=50)
LDL Cholesterol (Calc): 131 mg/dL (calc) — ABNORMAL HIGH
Non-HDL Cholesterol (Calc): 170 mg/dL (calc) — ABNORMAL HIGH (ref <130)
Total CHOL/HDL Ratio: 5.7 (calc) — ABNORMAL HIGH (ref <5.0)
Triglycerides: 242 mg/dL — ABNORMAL HIGH (ref <150)

## 2020-03-21 LAB — HEPATITIS C ANTIBODY
Hepatitis C Ab: NONREACTIVE
SIGNAL TO CUT-OFF: 0.01 (ref <1.00)

## 2020-06-20 ENCOUNTER — Other Ambulatory Visit: Payer: Self-pay | Admitting: *Deleted

## 2020-06-20 DIAGNOSIS — F320 Major depressive disorder, single episode, mild: Secondary | ICD-10-CM

## 2020-06-20 MED ORDER — FLUOXETINE HCL 20 MG PO CAPS: 40.0000 mg | ORAL_CAPSULE | Freq: Every day | ORAL | 3 refills | Status: DC

## 2020-07-13 ENCOUNTER — Telehealth: Payer: Self-pay | Admitting: *Deleted

## 2020-07-13 NOTE — Telephone Encounter (Signed)
Returned call from 11:48 AM. Left patient a message to call and schedule annual and IUD removal.

## 2020-07-18 ENCOUNTER — Encounter: Payer: Self-pay | Admitting: Obstetrics & Gynecology

## 2020-07-18 ENCOUNTER — Other Ambulatory Visit (HOSPITAL_COMMUNITY)
Admission: RE | Admit: 2020-07-18 | Discharge: 2020-07-18 | Disposition: A | Discharge status: Home / Self Care | Payer: 59 | Source: Ambulatory Visit | Attending: Obstetrics & Gynecology | Admitting: Obstetrics & Gynecology

## 2020-07-18 ENCOUNTER — Ambulatory Visit: Payer: 59 | Admitting: Obstetrics & Gynecology

## 2020-07-18 ENCOUNTER — Other Ambulatory Visit: Payer: Self-pay

## 2020-07-18 VITALS — BP 141/86 | HR 87 | Ht 65.0 in | Wt 237.0 lb

## 2020-07-18 DIAGNOSIS — Z01419 Encounter for gynecological examination (general) (routine) without abnormal findings: Secondary | ICD-10-CM

## 2020-07-18 DIAGNOSIS — E669 Obesity, unspecified: Secondary | ICD-10-CM

## 2020-07-18 DIAGNOSIS — F419 Anxiety disorder, unspecified: Secondary | ICD-10-CM | POA: Diagnosis not present
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
## 2020-07-18 NOTE — Progress Notes (Signed)
Last pap 07/02/2019- negative Pt wants IUD- possibly wants OCP- wants to start trying to get pregnant in April

## 2020-07-19 LAB — CYTOLOGY - PAP
Comment: NEGATIVE
Diagnosis: NEGATIVE
High risk HPV: NEGATIVE

## 2020-08-03 ENCOUNTER — Encounter: Payer: Self-pay | Admitting: Medical-Surgical

## 2020-08-03 ENCOUNTER — Other Ambulatory Visit: Payer: Self-pay

## 2020-08-03 ENCOUNTER — Telehealth (INDEPENDENT_AMBULATORY_CARE_PROVIDER_SITE_OTHER): Payer: 59 | Admitting: Medical-Surgical

## 2020-08-03 VITALS — Temp 97.8°F

## 2020-08-03 DIAGNOSIS — J019 Acute sinusitis, unspecified: Secondary | ICD-10-CM

## 2020-08-03 MED ORDER — AMOXICILLIN-POT CLAVULANATE 875-125 MG PO TABS: 1.0000 | ORAL_TABLET | Freq: Two times a day (BID) | ORAL | 0 refills | Status: AC

## 2020-08-03 NOTE — Progress Notes (Signed)
Virtual Visit via Telephone   I connected with  Sarah Gonzales  on 08/03/20 by telephone/telehealth and verified that I am speaking with the correct person using two identifiers.   I discussed the limitations, risks, security and privacy concerns of performing an evaluation and management service by telephone, including the higher likelihood of inaccurate diagnosis and treatment, and the availability of in person appointments.  We also discussed the likely need of an additional face to face encounter for complete and high quality delivery of care.  I also discussed with the patient that there may be a patient responsible charge related to this service. The patient expressed understanding and wishes to proceed.  Provider location is in medical facility. Patient location is at their home, different from provider location. People involved in care of the patient during this telehealth encounter were myself, my nurse/medical assistant, and my front office/scheduling team member.  CC: sinus symptoms  HPI: Pleasant 30 year old female presenting via telephone visit for 1 month of sinus symptoms. Originally developed a sore/scratchy throat for a couple of days about a month ago. After that, she developed sinus congestion and pressure that has been worse in the morning and improves after showering and drinking coffee. She has had a few nosebleeds and has noted green nasal discharge. She thought her symptoms were related to migraines since she has had a HA more frequently than usual. Most days she is ok with milder symptoms but she has had a span of 4 days over the last week with continuous symptoms and HA. She has taken ibuprofen and tylenol regularly with minimal improvement. She also has tried OTC sinus medications with some improvement but no resolution. Took a dose of eletriptan yesterday with resolution of her headache but her sinus symptoms did not improve. Denies fever, chills, SOB, CP, and GI symptoms.    Review of Systems: See HPI for pertinent positives and negatives.   Objective Findings:    General: Speaking full sentences, no audible heavy breathing.  Sounds alert and appropriately interactive.    Impression and Recommendations:    1. Acute non-recurrent sinusitis, unspecified location Symptoms consistent with sinusitis and after being present for 4 weeks, treating with Augmentin BID x 7 days. Discussed supportive care. Ok to use Tylenol/Ibuprofen and/or OTC cold or sinus medications for symptom management.   I discussed the above assessment and treatment plan with the patient. The patient was provided an opportunity to ask questions and all were answered. The patient agreed with the plan and demonstrated an understanding of the instructions.   The patient was advised to call back or seek an in-person evaluation if the symptoms worsen or if the condition fails to improve as anticipated.  20 minutes of non-face-to-face time was provided during this encounter. Converted to telephone visit from MyChart visit due to patient unable to log on using MyChart or text link. Less than 50% of the appointment was completed via video visit.   Return if symptoms worsen or fail to improve. ___________________________________________ Christen Butter, DNP, APRN, FNP-BC Primary Care and Sports Medicine [facility]

## 2020-09-07 ENCOUNTER — Other Ambulatory Visit: Payer: Self-pay | Admitting: Family Medicine

## 2020-09-07 NOTE — Telephone Encounter (Signed)
Patient scheduled for F/U on 09/19/20. AM

## 2020-09-07 NOTE — Telephone Encounter (Signed)
Please call pt and have her schedule a f/u appt for bp

## 2020-09-19 ENCOUNTER — Other Ambulatory Visit: Payer: Self-pay

## 2020-09-19 ENCOUNTER — Ambulatory Visit (INDEPENDENT_AMBULATORY_CARE_PROVIDER_SITE_OTHER): Payer: 59 | Admitting: Family Medicine

## 2020-09-19 VITALS — BP 157/94 | HR 85 | Ht 65.0 in | Wt 237.0 lb

## 2020-09-19 DIAGNOSIS — S0990XA Unspecified injury of head, initial encounter: Secondary | ICD-10-CM | POA: Diagnosis not present

## 2020-09-19 DIAGNOSIS — R519 Headache, unspecified: Secondary | ICD-10-CM

## 2020-09-19 DIAGNOSIS — I10 Essential (primary) hypertension: Secondary | ICD-10-CM

## 2020-09-19 DIAGNOSIS — M542 Cervicalgia: Secondary | ICD-10-CM

## 2020-09-19 NOTE — Assessment & Plan Note (Signed)
Blood pressure not well controlled today but she says she feels very anxious.  Plan to come back in 2 weeks to recheck pressure.

## 2020-09-19 NOTE — Patient Instructions (Signed)
F/u in 2 wks for bp check with nurse

## 2020-09-19 NOTE — Progress Notes (Signed)
Acute Office Visit  Subjective:    Patient ID: Natane Heward, female    DOB: 06-11-1991, 30 y.o.   MRN: [id number]  Chief Complaint  Patient presents with  . Hypertension  . Laceration    Hit by piece of plastic on rt side of forehead on Saturday No LOC  Used ice and Tylenol Pt still complains of headache    HPI Patient is in today for head injury w/o LOC.  On Sat she was crouching down and a large piece of plastic that she and her family were moving flew up and hit her on the right forehead.  She was very upset right when it happened and became tearful she said she felt dizzy for a short period of time but that went away.  She is only felt the dizziness again when she bent over to pick up the cat bowl.  And No nausea or vomiting.  Says a piece of siding blew up and hit her in the head.  She still has a mild HA today but it is not severe.  She came in mostly today because she noticed that the swelling moved from the right forehead area to the middle of her head overnight.  She does feel some pressure in the area of the injury.  Past Medical History:  Diagnosis Date  . Anxiety   . Bartholin cyst   . Chronic headaches    Since the age of 6, severe in the past.  . Hypertension   . Warts     No past surgical history on file.  Family History  Problem Relation Age of Onset  . Hypertension Mother   . Hypertension Father   . Sleep apnea Father   . Asthma Other        aunt  . Bipolar disorder Maternal Aunt   . Hypertension Maternal Grandfather   . Lung cancer Maternal Grandfather   . Hypertension Maternal Grandmother   . Hypertension Paternal Grandfather   . Hypertension Paternal Grandmother     Social History   Socioeconomic History  . Marital status: Single    Spouse name: Not on file  . Number of children: Not on file  . Years of education: Not on file  . Highest education level: Not on file  Occupational History  . Occupation: server  Tobacco Use  . Smoking  status: Never Smoker  . Smokeless tobacco: Never Used  Vaping Use  . Vaping Use: Never used  Substance and Sexual Activity  . Alcohol use: Yes    Alcohol/week: 2.0 standard drinks    Types: 2 Standard drinks or equivalent per week    Comment: socially  . Drug use: No  . Sexual activity: Yes    Partners: Male    Birth control/protection: I.U.D.    Comment: lives with family, student at [employer], involved in drama  Other Topics Concern  . Not on file  Social History Narrative  . Not on file   Social Determinants of Health   Financial Resource Strain: Not on file  Food Insecurity: Not on file  Transportation Needs: Not on file  Physical Activity: Not on file  Stress: Not on file  Social Connections: Not on file  Intimate Partner Violence: Not on file    Outpatient Medications Prior to Visit  Medication Sig Dispense Refill  . eletriptan (RELPAX) 20 MG tablet Take 1 tablet (20 mg total) by mouth as needed for migraine or headache. May repeat in 2 hours if headache  persists or recurs. 10 tablet 1  . FLUoxetine (PROZAC) 20 MG capsule Take 2 capsules (40 mg total) by mouth daily. 60 capsule 3  . levonorgestrel (MIRENA) 20 MCG/24HR IUD 1 each by Intrauterine route once.    . propranolol ER (INDERAL LA) 60 MG 24 hr capsule Take 1 capsule by mouth once daily 30 capsule 0   No facility-administered medications prior to visit.    No Known Allergies  Review of Systems     Objective:    Physical Exam Vitals reviewed.  Constitutional:      Appearance: She is well-developed and well-nourished.  HENT:     Head: Normocephalic and atraumatic.     Right Ear: Tympanic membrane, ear canal and external ear normal.     Left Ear: Tympanic membrane, ear canal and external ear normal.  Eyes:     Extraocular Movements: EOM normal.     Conjunctiva/sclera: Conjunctivae normal.     Pupils: Pupils are equal, round, and reactive to light.  Cardiovascular:     Rate and Rhythm: Normal rate.   Pulmonary:     Effort: Pulmonary effort is normal.  Musculoskeletal:     Comments: With normal flexion, extension and rotation right and left though she did have some pain in the right side of her neck with rotation.  Normal sidebending bilaterally.  Skin:    General: Skin is dry.     Coloration: Skin is not pale.     Comments: She has a hematoma on the right forehead and some swelling medially.    Neurological:     Mental Status: She is alert and oriented to person, place, and time.  Psychiatric:        Mood and Affect: Mood and affect normal.        Behavior: Behavior normal.     BP (!) 157/94   Pulse 85   Ht 5\' 5"  (1.651 m)   Wt 237 lb (107.5 kg)   LMP 09/17/2020 (Approximate)   SpO2 100%   BMI 39.44 kg/m  Wt Readings from Last 3 Encounters:  09/19/20 237 lb (107.5 kg)  07/18/20 237 lb (107.5 kg)  03/15/20 237 lb (107.5 kg)    Health Maintenance Due  Topic Date Due  . COVID-19 Vaccine (3 - Booster for Pfizer series) 09/14/2020    There are no preventive care reminders to display for this patient.   Lab Results  Component Value Date   TSH 0.907 02/11/2015   Lab Results  Component Value Date   WBC 6.3 01/16/2011   HGB 14.5 01/16/2011   HCT 44.8 01/16/2011   MCV 82.4 01/16/2011   PLT 257 01/16/2011   Lab Results  Component Value Date   NA 138 03/18/2020   K 3.7 03/18/2020   CO2 25 03/18/2020   GLUCOSE 87 03/18/2020   BUN 14 03/18/2020   CREATININE 0.66 03/18/2020   BILITOT 0.5 03/18/2020   ALKPHOS 77 12/31/2016   AST 24 03/18/2020   ALT 24 03/18/2020   PROT 6.9 03/18/2020   ALBUMIN 4.4 12/31/2016   CALCIUM 9.1 03/18/2020   Lab Results  Component Value Date   CHOL 206 (H) 03/18/2020   Lab Results  Component Value Date   HDL 36 (L) 03/18/2020   Lab Results  Component Value Date   LDLCALC 131 (H) 03/18/2020   Lab Results  Component Value Date   TRIG 242 (H) 03/18/2020   Lab Results  Component Value Date   CHOLHDL 5.7 (H) 03/18/2020  Lab Results  Component Value Date   HGBA1C 5.2 07/02/2019       Assessment & Plan:   Problem List Items Addressed This Visit      Cardiovascular and Mediastinum   Hypertension    Blood pressure not well controlled today but she says she feels very anxious.  Plan to come back in 2 weeks to recheck pressure.       Other Visit Diagnoses    Injury of head, initial encounter    -  Primary   Acute nonintractable headache, unspecified headache type       Neck pain on right side         Head injury -most consistent with soft tissue injury no red flag symptoms today.  I do suspect she has a mild concussion as she still has a mild headache and some pressure but it is not severe no vomiting no blurry vision.  No memory issues.  She is not on any blood thinners she is just using Tylenol as needed for pain.  We discussed having a low threshold to get a CT scan for further imaging if at any point she develops new symptoms worsening symptoms or develops more intense headache.  In the meantime recommend low intensity activity for at least the next couple days to see if she continues to improve.  Also recommend icing frequently.  Neck pain-recommend heat and gentle range of motion.  Call if not improving. No orders of the defined types were placed in this encounter.  I spent 30 minutes on the day of the encounter to include pre-visit record review, face-to-face time with the patient and post visit ordering of test.   Nani Gasser, MD

## 2020-10-03 ENCOUNTER — Ambulatory Visit: Payer: 59

## 2020-10-18 ENCOUNTER — Ambulatory Visit: Payer: 59 | Admitting: Advanced Practice Midwife

## 2020-10-19 ENCOUNTER — Other Ambulatory Visit: Payer: Self-pay | Admitting: Family Medicine

## 2020-12-02 ENCOUNTER — Other Ambulatory Visit: Payer: Self-pay | Admitting: Family Medicine

## 2020-12-05 ENCOUNTER — Ambulatory Visit: Payer: 59 | Admitting: Physician Assistant

## 2020-12-05 ENCOUNTER — Other Ambulatory Visit: Payer: 59

## 2020-12-05 ENCOUNTER — Encounter: Payer: Self-pay | Admitting: Physician Assistant

## 2020-12-05 ENCOUNTER — Telehealth: Payer: Self-pay

## 2020-12-05 ENCOUNTER — Other Ambulatory Visit: Payer: Self-pay | Admitting: Physician Assistant

## 2020-12-05 VITALS — BP 130/89 | HR 65 | Temp 98.8°F | Resp 20 | Ht 65.0 in | Wt 237.0 lb

## 2020-12-05 DIAGNOSIS — N939 Abnormal uterine and vaginal bleeding, unspecified: Secondary | ICD-10-CM | POA: Diagnosis not present

## 2020-12-05 DIAGNOSIS — Z975 Presence of (intrauterine) contraceptive device: Secondary | ICD-10-CM | POA: Insufficient documentation

## 2020-12-05 DIAGNOSIS — R718 Other abnormality of red blood cells: Secondary | ICD-10-CM

## 2020-12-05 DIAGNOSIS — R109 Unspecified abdominal pain: Secondary | ICD-10-CM | POA: Diagnosis not present

## 2020-12-05 LAB — POCT URINE PREGNANCY: Preg Test, Ur: NEGATIVE

## 2020-12-05 NOTE — Patient Instructions (Signed)
Testing for pregnancy.  Get labs.  Get ultrasound. Keep follow up with GYN.

## 2020-12-05 NOTE — Telephone Encounter (Signed)
Pt called stating that she is having heavy bleeding and cramping with IUD. We do not have any availability until Thursday. Pt declined Thursday appt. Pt concerned she could possibly be having miscarriage. I recommended pt go to [facility] but pt states bleeding has gotten better since yesterday and she would rather not go to [facility].  Pt was told to call PCP or go to urgent care. Pt expressed understanding.

## 2020-12-05 NOTE — Progress Notes (Signed)
Negative urine pregnancy

## 2020-12-05 NOTE — Progress Notes (Signed)
Subjective:    Patient ID: Sarah Gonzales, female    DOB: 1991-06-04, 30 y.o.   MRN: [id number]  HPI  Pt is a 30 yo female who presents to the clinic to discuss abdominal vaginal bleeding and cramping for the last week. She has IUD Mirena for 5 years in Jan. She has always had regular but light periods with mirena. She has been bleeding clots for the last week and very heavy and at times but now more spotting.   .. Active Ambulatory Problems    Diagnosis Date Noted  . Migraine headache 07/07/2008  . ALLERGIC RHINITIS CAUSE UNSPECIFIED 08/26/2008  . DYSPEPSIA 08/26/2008  . BURSITIS, RIGHT SHOULDER 11/30/2009  . HYPERHIDROSIS 08/26/2008  . IBS (irritable bowel syndrome) 01/16/2011  . Hypertension 08/25/2015  . Mild sleep apnea 12/05/2015  . Obesity, Class II, BMI 35-39.9 12/14/2015  . Depression 08/02/2016  . Chronic bilateral low back pain without sciatica 07/09/2019  . Abdominal cramping 12/05/2020  . IUD (intrauterine device) in place 12/05/2020  . Abnormal vaginal bleeding 12/05/2020   Resolved Ambulatory Problems    Diagnosis Date Noted  . Acute pharyngitis 06/28/2008  . VIRAL URI 11/30/2009  . GASTROENTERITIS 08/26/2008  . FOLLICULITIS 06/19/2010  . FATIGUE 06/19/2010  . Urinary frequency 01/26/2008  . ELEVATED BLOOD PRESSURE 01/26/2008  . Bipolar II disorder (HCC) 07/10/2012   Past Medical History:  Diagnosis Date  . Anxiety   . Bartholin cyst   . Chronic headaches   . Warts        Review of Systems See HPI.     Objective:   Physical Exam Vitals reviewed.  Constitutional:      Appearance: Normal appearance. She is obese.  HENT:     Head: Normocephalic.  Cardiovascular:     Rate and Rhythm: Normal rate and regular rhythm.     Pulses: Normal pulses.  Pulmonary:     Effort: Pulmonary effort is normal.  Abdominal:     General: Bowel sounds are normal. There is no distension.     Palpations: Abdomen is soft. There is no mass.     Tenderness: There is  no abdominal tenderness. There is no right CVA tenderness, left CVA tenderness or guarding.  Genitourinary:    Comments: IUD strings in place Scant brown blood in vaginal canal. No active bleeding seen.  Friable cervix.  Neurological:     General: No focal deficit present.     Mental Status: She is alert.  Psychiatric:        Mood and Affect: Mood normal.           Assessment & Plan:  Marland KitchenMarland KitchenTywanna was seen today for vaginal bleeding.  Diagnoses and all orders for this visit:  Abnormal vaginal bleeding -     US Pelvic Complete With Transvaginal; Future -     B-HCG Quant -     TSH -     COMPLETE METABOLIC PANEL WITH GFR -     CBC w/Diff/Platelet -     POCT urine pregnancy -     C. trachomatis/N. gonorrhoeae RNA  IUD (intrauterine device) in place -     US Pelvic Complete With Transvaginal; Future  Abdominal cramping -     B-HCG Quant -     TSH -     COMPLETE METABOLIC PANEL WITH GFR -     CBC w/Diff/Platelet  Other orders -     Iron, TIBC and Ferritin Panel   Unclear etiology of vaginal bleeding. She  is getting IUD taken out next Tuesday by GYN to discuss trying for pregnancy.  Negative UPT today.  Will send for serum HCG.  Will check TSH, CMP, CBC.  STD testing ordered.  [name] ordered to look for any structural abnormality.  Bleeding seems to have stopped. No red flags on today's exam. Follow up with OB/GYN.

## 2020-12-06 ENCOUNTER — Ambulatory Visit (INDEPENDENT_AMBULATORY_CARE_PROVIDER_SITE_OTHER): Payer: 59

## 2020-12-06 ENCOUNTER — Other Ambulatory Visit: Payer: Self-pay

## 2020-12-06 DIAGNOSIS — Z975 Presence of (intrauterine) contraceptive device: Secondary | ICD-10-CM

## 2020-12-06 DIAGNOSIS — N939 Abnormal uterine and vaginal bleeding, unspecified: Secondary | ICD-10-CM

## 2020-12-06 LAB — C. TRACHOMATIS/N. GONORRHOEAE RNA
C. trachomatis RNA, TMA: NOT DETECTED
N. gonorrhoeae RNA, TMA: NOT DETECTED

## 2020-12-06 LAB — HOUSE ACCOUNT TRACKING

## 2020-12-06 NOTE — Progress Notes (Signed)
Normal pelvic ultrasound. No masses. Normal ovaries.

## 2020-12-06 NOTE — Progress Notes (Signed)
Sarah Gonzales,   No sign of any pregnancy.  No acute concerns in blood.  Kidney, liver, glucose look great.  Thyroid is normal range but in the upper limits of normal. Trending towards hypo(low) thyroid.  You continue to make a little more RBC than normal range and you blood is showing to be a little thick. Lets add iron panel, ferritin to labs. If you had not had a lot to drink could be some mild dehydration as well.  Blood is appearing a little thick on CBC

## 2020-12-07 LAB — IRON,TIBC AND FERRITIN PANEL
%SAT: 22 % (calc) (ref 16–45)
Ferritin: 56 ng/mL (ref 16–154)
Iron: 80 ug/dL (ref 40–190)
TIBC: 370 mcg/dL (calc) (ref 250–450)

## 2020-12-07 LAB — CBC WITH DIFFERENTIAL/PLATELET
Absolute Monocytes: 698 cells/uL (ref 200–950)
Basophils Absolute: 58 cells/uL (ref 0–200)
Basophils Relative: 0.6 %
Eosinophils Absolute: 155 cells/uL (ref 15–500)
Eosinophils Relative: 1.6 %
HCT: 45.5 % — ABNORMAL HIGH (ref 35.0–45.0)
Hemoglobin: 15 g/dL (ref 11.7–15.5)
Lymphs Abs: 2318 cells/uL (ref 850–3900)
MCH: 26.5 pg — ABNORMAL LOW (ref 27.0–33.0)
MCHC: 33 g/dL (ref 32.0–36.0)
MCV: 80.5 fL (ref 80.0–100.0)
MPV: 9.4 fL (ref 7.5–12.5)
Monocytes Relative: 7.2 %
Neutro Abs: 6470 cells/uL (ref 1500–7800)
Neutrophils Relative %: 66.7 %
Platelets: 341 10*3/uL (ref 140–400)
RBC: 5.65 10*6/uL — ABNORMAL HIGH (ref 3.80–5.10)
RDW: 12.8 % (ref 11.0–15.0)
Total Lymphocyte: 23.9 %
WBC: 9.7 10*3/uL (ref 3.8–10.8)

## 2020-12-07 LAB — COMPLETE METABOLIC PANEL WITH GFR
AG Ratio: 1.6 (calc) (ref 1.0–2.5)
ALT: 19 U/L (ref 6–29)
AST: 19 U/L (ref 10–30)
Albumin: 4.6 g/dL (ref 3.6–5.1)
Alkaline phosphatase (APISO): 76 U/L (ref 31–125)
BUN: 13 mg/dL (ref 7–25)
CO2: 23 mmol/L (ref 20–32)
Calcium: 9.9 mg/dL (ref 8.6–10.2)
Chloride: 104 mmol/L (ref 98–110)
Creat: 0.63 mg/dL (ref 0.50–1.10)
GFR, Est African American: 140 mL/min/{1.73_m2} (ref >=60)
GFR, Est Non African American: 120 mL/min/{1.73_m2} (ref >=60)
Globulin: 2.8 g/dL (calc) (ref 1.9–3.7)
Glucose, Bld: 83 mg/dL (ref 65–99)
Potassium: 4 mmol/L (ref 3.5–5.3)
Sodium: 137 mmol/L (ref 135–146)
Total Bilirubin: 0.4 mg/dL (ref 0.2–1.2)
Total Protein: 7.4 g/dL (ref 6.1–8.1)

## 2020-12-07 LAB — HCG, QUANTITATIVE, PREGNANCY: HCG, Total, QN: <3 m[IU]/mL

## 2020-12-07 LAB — TSH: TSH: 3.48 mIU/L

## 2020-12-07 NOTE — Progress Notes (Signed)
Iron and iron stores good.

## 2020-12-09 NOTE — Progress Notes (Signed)
Sarah Gonzales,   Negative STD testing.

## 2020-12-13 ENCOUNTER — Other Ambulatory Visit: Payer: Self-pay

## 2020-12-13 ENCOUNTER — Encounter: Payer: Self-pay | Admitting: Advanced Practice Midwife

## 2020-12-13 ENCOUNTER — Ambulatory Visit: Payer: 59 | Admitting: Advanced Practice Midwife

## 2020-12-13 VITALS — BP 132/88 | HR 69 | Ht 65.0 in | Wt 237.0 lb

## 2020-12-13 DIAGNOSIS — Z30432 Encounter for removal of intrauterine contraceptive device: Secondary | ICD-10-CM

## 2020-12-13 NOTE — Progress Notes (Signed)
    GYNECOLOGY CLINIC PROCEDURE NOTE  Ms. Sarah Gonzales is a 30 y.o. G0P0000 here for Mirena IUD removal. IUD placed 08/16/15.  No GYN concerns.  Last pap smear was on 07/18/20 and was normal.  IUD Removal  Patient was in the dorsal lithotomy position, normal external genitalia was noted.  A speculum was placed in the patient's vagina, normal discharge was noted, no lesions. The multiparous cervix was visualized, no lesions, no abnormal discharge.  The strings of the IUD were grasped and pulled using ring forceps. The IUD was removed in its entirety. Patient tolerated the procedure well.    Patientplans for pregnancy soon and she was told to avoid teratogens, take PNV and folic acid.  Routine preventative health maintenance measures emphasized.  Hurshel Party, CNM 12/13/2020 8:18 AM

## 2020-12-14 ENCOUNTER — Other Ambulatory Visit: Payer: Self-pay | Admitting: *Deleted

## 2020-12-14 MED ORDER — ELETRIPTAN HYDROBROMIDE 20 MG PO TABS: 20.0000 mg | ORAL_TABLET | ORAL | 1 refills | Status: DC | PRN

## 2021-01-08 ENCOUNTER — Other Ambulatory Visit: Payer: Self-pay | Admitting: Family Medicine

## 2021-01-09 ENCOUNTER — Other Ambulatory Visit: Payer: Self-pay | Admitting: *Deleted

## 2021-01-09 NOTE — Telephone Encounter (Signed)
Lvm advising pt to call office to schedule appt for f/u on Fluoxetine.

## 2021-01-23 ENCOUNTER — Other Ambulatory Visit: Payer: Self-pay

## 2021-01-23 ENCOUNTER — Emergency Department
Admission: RE | Admit: 2021-01-23 | Discharge: 2021-01-23 | Disposition: A | Discharge status: Home / Self Care | Payer: 59 | Source: Ambulatory Visit | Attending: Family Medicine | Admitting: Family Medicine

## 2021-01-23 VITALS — BP 127/84 | HR 72 | Temp 98.2°F | Resp 16 | Ht 65.0 in | Wt 230.0 lb

## 2021-01-23 DIAGNOSIS — G43719 Chronic migraine without aura, intractable, without status migrainosus: Secondary | ICD-10-CM | POA: Diagnosis not present

## 2021-01-23 MED ORDER — KETOROLAC TROMETHAMINE 30 MG/ML IJ SOLN
30.0000 mg | Freq: Once | INTRAMUSCULAR | Status: AC
  Administered 2021-01-23: 30 mg via INTRAMUSCULAR

## 2021-01-23 MED ORDER — ONDANSETRON 8 MG PO TBDP: 8.0000 mg | ORAL_TABLET | Freq: Three times a day (TID) | ORAL | 0 refills | Status: DC | PRN

## 2021-01-23 MED ORDER — DEXAMETHASONE SODIUM PHOSPHATE 10 MG/ML IJ SOLN
10.0000 mg | Freq: Once | INTRAMUSCULAR | Status: AC
  Administered 2021-01-23: 10 mg via INTRAMUSCULAR

## 2021-01-23 NOTE — Discharge Instructions (Addendum)
Try to go home and rest in a dark room I have prescribed Zofran to take as needed for nausea and vomiting Call your doctor if you need other medicine refills Consider seeing a neurologist if your headaches worsen

## 2021-01-23 NOTE — ED Triage Notes (Addendum)
Patient here day 4 of current headache; has had intermittent headaches over past 3 weeks and has used her home rxs without complete relief. Has never seen neurologist ; Dr.Metheney handles her preventative regime. Has had first 2 covid vaccinations; no booster yet; did have covid 1/22. In past has been relieved from migraine with torodol and decadron injections.

## 2021-01-23 NOTE — ED Provider Notes (Signed)
[facility]    CSN: [id number] Arrival date & time: 01/23/21  1551      History   Chief Complaint Chief Complaint  Patient presents with   Migraine    HPI Sarah Gonzales is a 30 y.o. female.   HPI  Patient states she has chronic migraines.  She is under the care of her PCP.  She has a prescription for Relpax to use as needed.  She currently has a headache going on for couple of days.  She has photophobia.  She has nausea.  No vomiting.  No scotomata or aura.  She has never seen a neurologist.  She states she thinks the Relpax is not working as well as it used to.  She will follow-up with her primary care doctor if she needs a change in medication.  She is on propanolol.  This has helped her for years.  Currently she is here because when her headache is difficult to control, she needs an injection.  No head injury.  No trauma.  No visual changes.  No cold symptoms or fever.  Past Medical History:  Diagnosis Date   Anxiety    Bartholin cyst    Chronic headaches    Since the age of 31, severe in the past.   Hypertension    Warts     Patient Active Problem List   Diagnosis Date Noted   Abdominal cramping 12/05/2020   IUD (intrauterine device) in place 12/05/2020   Abnormal vaginal bleeding 12/05/2020   Chronic bilateral low back pain without sciatica 07/09/2019   Depression 08/02/2016   Obesity, Class II, BMI 35-39.9 12/14/2015   Mild sleep apnea 12/05/2015   Hypertension 08/25/2015   IBS (irritable bowel syndrome) 01/16/2011   BURSITIS, RIGHT SHOULDER 11/30/2009   ALLERGIC RHINITIS CAUSE UNSPECIFIED 08/26/2008   DYSPEPSIA 08/26/2008   HYPERHIDROSIS 08/26/2008   Migraine headache 07/07/2008    History reviewed. No pertinent surgical history.  OB History     Gravida  0   Para  0   Term  0   Preterm  0   AB  0   Living  0      SAB  0   IAB  0   Ectopic  0   Multiple  0   Live Births               Home Medications    Prior to  Admission medications   Medication Sig Start Date End Date Taking? Authorizing Provider  ondansetron (ZOFRAN ODT) 8 MG disintegrating tablet Take 1 tablet (8 mg total) by mouth every 8 (eight) hours as needed for nausea or vomiting. 01/23/21  Yes Eustace Moore, MD  eletriptan (RELPAX) 20 MG tablet Take 1 tablet (20 mg total) by mouth as needed for migraine or headache. May repeat in 2 hours if headache persists or recurs. 12/14/20   Agapito Games, MD  FLUoxetine (PROZAC) 20 MG capsule Take 2 capsules by mouth once daily 01/09/21   Agapito Games, MD  propranolol ER (INDERAL LA) 60 MG 24 hr capsule Take 1 capsule (60 mg total) by mouth daily. 10/19/20   Agapito Games, MD    Family History Family History  Problem Relation Age of Onset   Hypertension Mother    Hypertension Father    Sleep apnea Father    Asthma Other        aunt   Bipolar disorder Maternal Aunt    Hypertension Maternal Grandfather  Lung cancer Maternal Grandfather    Hypertension Maternal Grandmother    Hypertension Paternal Grandfather    Hypertension Paternal Grandmother     Social History Social History   Tobacco Use   Smoking status: Never   Smokeless tobacco: Never  Vaping Use   Vaping Use: Never used  Substance Use Topics   Alcohol use: Yes    Alcohol/week: 2.0 standard drinks    Types: 2 Standard drinks or equivalent per week    Comment: socially   Drug use: No     Allergies   Patient has no known allergies.   Review of Systems Review of Systems See HPI  Physical Exam Triage Vital Signs ED Triage Vitals  Enc Vitals Group     BP 01/23/21 1601 127/84     Pulse Rate 01/23/21 1601 72     Resp 01/23/21 1601 16     Temp 01/23/21 1601 98.2 F (36.8 C)     Temp Source 01/23/21 1601 Oral     SpO2 01/23/21 1601 98 %     Weight 01/23/21 1605 230 lb (104.3 kg)     Height 01/23/21 1605 5\' 5"  (1.651 m)     Head Circumference --      Peak Flow --      Pain Score 01/23/21  1605 6     Pain Loc --      Pain Edu? --      Excl. in GC? --    No data found.  Updated Vital Signs BP 127/84 (BP Location: Right Arm)   Pulse 72   Temp 98.2 F (36.8 C) (Oral)   Resp 16   Ht 5\' 5"  (1.651 m)   Wt 104.3 kg   LMP 01/02/2021 (Approximate)   SpO2 98%   BMI 38.27 kg/m      Physical Exam Constitutional:      General: She is not in acute distress.    Appearance: She is well-developed. She is obese.     Comments: Appears uncomfortable  HENT:     Head: Normocephalic and atraumatic.  Eyes:     Extraocular Movements: Extraocular movements intact.     Conjunctiva/sclera: Conjunctivae normal.     Pupils: Pupils are equal, round, and reactive to light.  Cardiovascular:     Rate and Rhythm: Normal rate and regular rhythm.  Pulmonary:     Effort: Pulmonary effort is normal. No respiratory distress.  Abdominal:     General: There is no distension.     Palpations: Abdomen is soft.  Musculoskeletal:        General: Normal range of motion.     Cervical back: Normal range of motion.  Skin:    General: Skin is warm and dry.  Neurological:     General: No focal deficit present.     Mental Status: She is alert.     Cranial Nerves: No cranial nerve deficit.     Motor: No weakness.     Gait: Gait normal.  Psychiatric:        Mood and Affect: Mood normal.     UC Treatments / Results  Labs (all labs ordered are listed, but only abnormal results are displayed) Labs Reviewed - No data to display  EKG   Radiology No results found.  Procedures Procedures (including critical care time)  Medications Ordered in UC Medications  ketorolac (TORADOL) 30 MG/ML injection 30 mg (has no administration in time range)  dexamethasone (DECADRON) injection 10 mg (has no administration in  time range)    Initial Impression / Assessment and Plan / UC Course  I have reviewed the triage vital signs and the nursing notes.  Pertinent labs & imaging results that were available  during my care of the patient were reviewed by me and considered in my medical decision making (see chart for details).     We will give patient Toradol and Decadron for her migraine.  This is worked well for her in the past.  In addition we will send her home with a prescription for Zofran.  She does not have vomiting medication.  She will contact her primary care doctor regarding follow-up Final Clinical Impressions(s) / UC Diagnoses   Final diagnoses:  Intractable chronic migraine without aura and without status migrainosus     Discharge Instructions      Try to go home and rest in a dark room I have prescribed Zofran to take as needed for nausea and vomiting Call your doctor if you need other medicine refills Consider seeing a neurologist if your headaches worsen   ED Prescriptions     Medication Sig Dispense Auth. Provider   ondansetron (ZOFRAN ODT) 8 MG disintegrating tablet Take 1 tablet (8 mg total) by mouth every 8 (eight) hours as needed for nausea or vomiting. 20 tablet Eustace Moore, MD      PDMP not reviewed this encounter.   Eustace Moore, MD 01/23/21 [phone]

## 2021-02-13 ENCOUNTER — Ambulatory Visit: Payer: Commercial Managed Care - PPO | Admitting: Family Medicine

## 2021-02-13 ENCOUNTER — Encounter: Payer: Self-pay | Admitting: Family Medicine

## 2021-02-13 ENCOUNTER — Other Ambulatory Visit: Payer: Self-pay

## 2021-02-13 VITALS — BP 126/87 | HR 98 | Ht 65.0 in | Wt 241.0 lb

## 2021-02-13 DIAGNOSIS — G4730 Sleep apnea, unspecified: Secondary | ICD-10-CM

## 2021-02-13 DIAGNOSIS — I10 Essential (primary) hypertension: Secondary | ICD-10-CM | POA: Diagnosis not present

## 2021-02-13 DIAGNOSIS — G43009 Migraine without aura, not intractable, without status migrainosus: Secondary | ICD-10-CM

## 2021-02-13 DIAGNOSIS — R0683 Snoring: Secondary | ICD-10-CM

## 2021-02-13 DIAGNOSIS — F320 Major depressive disorder, single episode, mild: Secondary | ICD-10-CM

## 2021-02-13 DIAGNOSIS — Z3A01 Less than 8 weeks gestation of pregnancy: Secondary | ICD-10-CM | POA: Diagnosis not present

## 2021-02-13 NOTE — Assessment & Plan Note (Signed)
Get sure not to take the Relpax or the Zofran.  Explained that these are dangerous during pregnancy.

## 2021-02-13 NOTE — Progress Notes (Signed)
Pt would like to discuss medications. She is [redacted] wks pregnant.

## 2021-02-13 NOTE — Assessment & Plan Note (Signed)
Discussed pros and cons of continuing SSRI.  For now she would like to continue the medication she feels like it would be helpful overall and help her to stay healthy and maintain a healthy pregnancy.  I agree to make sure that she has refills sent to her pharmacy.  Certainly if her GYN has any specific concerns I am happy to address that or we could even look at changing the medication if needed.

## 2021-02-13 NOTE — Assessment & Plan Note (Signed)
Last sleep study was in 2017 AHI was 5.4.  Her husband reports that her symptoms seem to be worse.  Repeated STOP-BANG questionnaire today and score was positive for 6 out of 8 questions.  We will place referral for home sleep study.

## 2021-02-13 NOTE — Progress Notes (Addendum)
Established Patient Office Visit  Subjective:  Patient ID: Sarah Gonzales, female    DOB: [birth year]  Age: 30 y.o. MRN: [id number]  CC:  Chief Complaint  Patient presents with   Follow-up    HPI [facility] presents for New pregnancy.  She has never been preventn before. She is taking a PNV. She has cut out alcohol and cut back on caffeine.  He has been experiencing some nausea but no vomiting she is otherwise been feeling well she is been try to stay active.  She is approximately [redacted] weeks pregnant.  She is here today because she wants to go over her medications to see what is safe to continue and what is not.  She also reports that her husband says that she has been snoring more loudly and also stopping breathing she said she was tested for sleep apnea several years ago and was a little borderline at that time.  She does report daytime sleepiness and fatigue and being able to fall asleep easily.  Her BMI is greater than 40.  Past Medical History:  Diagnosis Date   Anxiety    Bartholin cyst    Chronic headaches    Since the age of 70, severe in the past.   Hypertension    Warts     No past surgical history on file.  Family History  Problem Relation Age of Onset   Hypertension Mother    Hypertension Father    Sleep apnea Father    Asthma Other        aunt   Bipolar disorder Maternal Aunt    Hypertension Maternal Grandfather    Lung cancer Maternal Grandfather    Hypertension Maternal Grandmother    Hypertension Paternal Grandfather    Hypertension Paternal Grandmother     Social History   Socioeconomic History   Marital status: Single    Spouse name: Not on file   Number of children: Not on file   Years of education: Not on file   Highest education level: Not on file  Occupational History   Occupation: server  Tobacco Use   Smoking status: Never   Smokeless tobacco: Never  Vaping Use   Vaping Use: Never used  Substance and Sexual Activity   Alcohol use: Yes     Alcohol/week: 2.0 standard drinks    Types: 2 Standard drinks or equivalent per week    Comment: socially   Drug use: No   Sexual activity: Yes    Partners: Male    Birth control/protection: I.U.D.    Comment: lives with family, student at [employer], involved in drama  Other Topics Concern   Not on file  Social History Narrative   Not on file   Social Determinants of Health   Financial Resource Strain: Not on file  Food Insecurity: Not on file  Transportation Needs: Not on file  Physical Activity: Not on file  Stress: Not on file  Social Connections: Not on file  Intimate Partner Violence: Not on file    Outpatient Medications Prior to Visit  Medication Sig Dispense Refill   FLUoxetine (PROZAC) 20 MG capsule Take 2 capsules by mouth once daily 60 capsule 0   propranolol ER (INDERAL LA) 60 MG 24 hr capsule Take 1 capsule (60 mg total) by mouth daily. 90 capsule 1   eletriptan (RELPAX) 20 MG tablet Take 1 tablet (20 mg total) by mouth as needed for migraine or headache. May repeat in 2 hours if headache persists or  recurs. 10 tablet 1   ondansetron (ZOFRAN ODT) 8 MG disintegrating tablet Take 1 tablet (8 mg total) by mouth every 8 (eight) hours as needed for nausea or vomiting. 20 tablet 0   No facility-administered medications prior to visit.    No Known Allergies  ROS Review of Systems    Objective:    Physical Exam  BP 126/87   Pulse 98   Ht 5\' 5"  (1.651 m)   Wt 241 lb (109.3 kg)   LMP 01/02/2021 (Approximate)   SpO2 99%   BMI 40.10 kg/m  Wt Readings from Last 3 Encounters:  02/13/21 241 lb (109.3 kg)  01/23/21 230 lb (104.3 kg)  12/13/20 237 lb (107.5 kg)     Health Maintenance Due  Topic Date Due   COVID-19 Vaccine (3 - Booster for Pfizer series) 08/14/2020    There are no preventive care reminders to display for this patient.  Lab Results  Component Value Date   TSH 3.48 12/05/2020   Lab Results  Component Value Date   WBC 9.7 12/05/2020    HGB 15.0 12/05/2020   HCT 45.5 (H) 12/05/2020   MCV 80.5 12/05/2020   PLT 341 12/05/2020   Lab Results  Component Value Date   NA 137 12/05/2020   K 4.0 12/05/2020   CO2 23 12/05/2020   GLUCOSE 83 12/05/2020   BUN 13 12/05/2020   CREATININE 0.63 12/05/2020   BILITOT 0.4 12/05/2020   ALKPHOS 77 12/31/2016   AST 19 12/05/2020   ALT 19 12/05/2020   PROT 7.4 12/05/2020   ALBUMIN 4.4 12/31/2016   CALCIUM 9.9 12/05/2020   Lab Results  Component Value Date   CHOL 206 (H) 03/18/2020   Lab Results  Component Value Date   HDL 36 (L) 03/18/2020   Lab Results  Component Value Date   LDLCALC 131 (H) 03/18/2020   Lab Results  Component Value Date   TRIG 242 (H) 03/18/2020   Lab Results  Component Value Date   CHOLHDL 5.7 (H) 03/18/2020   Lab Results  Component Value Date   HGBA1C 5.2 07/02/2019      Assessment & Plan:   Problem List Items Addressed This Visit       Cardiovascular and Mediastinum   Migraine headache    Get sure not to take the Relpax or the Zofran.  Explained that these are dangerous during pregnancy.       Hypertension    Okay to continue propranolol she is doing well on the medication notes any specific concerns today.  She can also address with OB/GYN when she gets established with them in a few weeks.         Respiratory   Mild sleep apnea    Last sleep study was in 2017 AHI was 5.4.  Her husband reports that her symptoms seem to be worse.  Repeated STOP-BANG questionnaire today and score was positive for 6 out of 8 questions.  We will place referral for home sleep study.       Relevant Orders   Home sleep test     Other   Depression    Discussed pros and cons of continuing SSRI.  For now she would like to continue the medication she feels like it would be helpful overall and help her to stay healthy and maintain a healthy pregnancy.  I agree to make sure that she has refills sent to her pharmacy.  Certainly if her GYN has any specific  concerns I  am happy to address that or we could even look at changing the medication if needed.       Other Visit Diagnoses     Less than [redacted] weeks gestation of pregnancy    -  Primary   Snoring       Relevant Orders   Home sleep test        No orders of the defined types were placed in this encounter.   Follow-up: No follow-ups on file.    Nani Gasser, MD

## 2021-02-13 NOTE — Addendum Note (Signed)
Addended by: Nani Gasser D on: 02/13/2021 05:01 PM   Modules accepted: Orders, Level of Service

## 2021-02-13 NOTE — Assessment & Plan Note (Signed)
Okay to continue propranolol she is doing well on the medication notes any specific concerns today.  She can also address with OB/GYN when she gets established with them in a few weeks.

## 2021-02-14 ENCOUNTER — Telehealth: Payer: Self-pay | Admitting: *Deleted

## 2021-02-14 NOTE — Telephone Encounter (Signed)
Left patient a message to reschedule for 08/17 or after 03/20/21 due to Lora being on PAL from 08/18-8/22/22. Office will not be able to do New OB scan.

## 2021-02-18 ENCOUNTER — Encounter: Payer: Self-pay | Admitting: Family Medicine

## 2021-02-20 ENCOUNTER — Other Ambulatory Visit: Payer: Self-pay | Admitting: Family Medicine

## 2021-02-20 NOTE — Telephone Encounter (Signed)
Can you check on this Cindy? 

## 2021-02-21 ENCOUNTER — Other Ambulatory Visit: Payer: Self-pay | Admitting: *Deleted

## 2021-02-21 DIAGNOSIS — G4730 Sleep apnea, unspecified: Secondary | ICD-10-CM

## 2021-02-21 DIAGNOSIS — R0683 Snoring: Secondary | ICD-10-CM

## 2021-02-28 ENCOUNTER — Ambulatory Visit (INDEPENDENT_AMBULATORY_CARE_PROVIDER_SITE_OTHER): Payer: Commercial Managed Care - PPO | Admitting: Family Medicine

## 2021-02-28 ENCOUNTER — Encounter: Payer: Self-pay | Admitting: Family Medicine

## 2021-02-28 ENCOUNTER — Other Ambulatory Visit: Payer: Self-pay

## 2021-02-28 VITALS — BP 126/84 | HR 67 | Temp 98.2°F | Ht 65.0 in | Wt 237.0 lb

## 2021-02-28 DIAGNOSIS — Z0000 Encounter for general adult medical examination without abnormal findings: Secondary | ICD-10-CM

## 2021-02-28 NOTE — Progress Notes (Signed)
Subjective:     Sarah Gonzales is a 30 y.o. female and is here for a comprehensive physical exam. The patient reports no problems.  Social History   Socioeconomic History   Marital status: Single    Spouse name: Not on file   Number of children: Not on file   Years of education: Not on file   Highest education level: Not on file  Occupational History   Occupation: server  Tobacco Use   Smoking status: Never   Smokeless tobacco: Never  Vaping Use   Vaping Use: Never used  Substance and Sexual Activity   Alcohol use: Yes    Alcohol/week: 2.0 standard drinks    Types: 2 Standard drinks or equivalent per week    Comment: socially   Drug use: No   Sexual activity: Yes    Partners: Male    Birth control/protection: I.U.D.    Comment: lives with family, student at [employer], involved in drama  Other Topics Concern   Not on file  Social History Narrative   Not on file   Social Determinants of Health   Financial Resource Strain: Not on file  Food Insecurity: Not on file  Transportation Needs: Not on file  Physical Activity: Not on file  Stress: Not on file  Social Connections: Not on file  Intimate Partner Violence: Not on file   Health Maintenance  Topic Date Due   COVID-19 Vaccine (3 - Booster for Pfizer series) 08/14/2020   INFLUENZA VACCINE  02/27/2021   HIV Screening  08/03/2021 (Originally 10/29/2005)   PAP SMEAR-Modifier  07/19/2023   TETANUS/TDAP  02/26/2029   Hepatitis C Screening  Completed   Pneumococcal Vaccine 43-35 Years old  Aged Out   HPV VACCINES  Aged Out    The following portions of the patient's history were reviewed and updated as appropriate: allergies, current medications, past family history, past medical history, past social history, past surgical history, and problem list.  Review of Systems A comprehensive review of systems was negative.   Objective:    BP 126/84   Pulse 67   Temp 98.2 F (36.8 C) (Oral)   Ht 5\' 5"  (1.651 m)   Wt 237 lb  (107.5 kg)   LMP 01/02/2021 (Approximate)   SpO2 100% Comment: on RA  BMI 39.44 kg/m  General appearance: alert, cooperative, and appears stated age Head: Normocephalic, without obvious abnormality, atraumatic Eyes:  conj clear, EOMi, PEERLA Ears: normal TM's and external ear canals both ears Nose: Nares normal. Septum midline. Mucosa normal. No drainage or sinus tenderness. Throat: lips, mucosa, and tongue normal; teeth and gums normal Neck: no adenopathy, no carotid bruit, no JVD, supple, symmetrical, trachea midline, and thyroid not enlarged, symmetric, no tenderness/mass/nodules Back: symmetric, no curvature. ROM normal. No CVA tenderness. Lungs: clear to auscultation bilaterally Heart: regular rate and rhythm, S1, S2 normal, no murmur, click, rub or gallop Abdomen: soft, non-tender; bowel sounds normal; no masses,  no organomegaly Extremities: extremities normal, atraumatic, no cyanosis or edema Pulses: 2+ and symmetric Skin: Skin color, texture, turgor normal. No rashes or lesions Lymph nodes: Cervical adenopathy: nl and Supraclavicular adenopathy: nl Neurologic: Grossly normal    Assessment:    Healthy female exam.      Plan:     See After Visit Summary for Counseling Recommendations  Keep up a regular exercise program and make sure you are eating a healthy diet Try to eat 4 servings of dairy a day, or if you are lactose intolerant take  a calcium with vitamin D daily.  Your vaccines are up to date.  Form completed for work.

## 2021-02-28 NOTE — Telephone Encounter (Signed)
Order was not in correctly it is now fixed and I will send to [facility] once they do the authorization they will call and schedule with patient - CF

## 2021-03-01 LAB — CBC
HCT: 43.9 % (ref 35.0–45.0)
Hemoglobin: 14.4 g/dL (ref 11.7–15.5)
MCH: 27 pg (ref 27.0–33.0)
MCHC: 32.8 g/dL (ref 32.0–36.0)
MCV: 82.2 fL (ref 80.0–100.0)
MPV: 9.4 fL (ref 7.5–12.5)
Platelets: 317 10*3/uL (ref 140–400)
RBC: 5.34 10*6/uL — ABNORMAL HIGH (ref 3.80–5.10)
RDW: 13.7 % (ref 11.0–15.0)
WBC: 9.6 10*3/uL (ref 3.8–10.8)

## 2021-03-01 LAB — LIPID PANEL W/REFLEX DIRECT LDL
Cholesterol: 190 mg/dL (ref <200)
HDL: 45 mg/dL — ABNORMAL LOW (ref >=50)
LDL Cholesterol (Calc): 116 mg/dL (calc) — ABNORMAL HIGH
Non-HDL Cholesterol (Calc): 145 mg/dL (calc) — ABNORMAL HIGH (ref <130)
Total CHOL/HDL Ratio: 4.2 (calc) (ref <5.0)
Triglycerides: 169 mg/dL — ABNORMAL HIGH (ref <150)

## 2021-03-01 LAB — HEMOGLOBIN A1C
Hgb A1c MFr Bld: 5 % of total Hgb (ref <5.7)
Mean Plasma Glucose: 97 mg/dL
eAG (mmol/L): 5.4 mmol/L

## 2021-03-14 ENCOUNTER — Other Ambulatory Visit: Payer: Self-pay

## 2021-03-14 ENCOUNTER — Other Ambulatory Visit (HOSPITAL_COMMUNITY)
Admission: RE | Admit: 2021-03-14 | Discharge: 2021-03-14 | Disposition: A | Discharge status: Home / Self Care | Payer: Commercial Managed Care - PPO | Source: Ambulatory Visit

## 2021-03-14 ENCOUNTER — Ambulatory Visit (INDEPENDENT_AMBULATORY_CARE_PROVIDER_SITE_OTHER): Payer: Commercial Managed Care - PPO

## 2021-03-14 VITALS — BP 134/88 | HR 83 | Wt 230.0 lb

## 2021-03-14 DIAGNOSIS — Z3401 Encounter for supervision of normal first pregnancy, first trimester: Secondary | ICD-10-CM

## 2021-03-14 DIAGNOSIS — Z8759 Personal history of other complications of pregnancy, childbirth and the puerperium: Secondary | ICD-10-CM

## 2021-03-14 DIAGNOSIS — O10919 Unspecified pre-existing hypertension complicating pregnancy, unspecified trimester: Secondary | ICD-10-CM

## 2021-03-14 DIAGNOSIS — F419 Anxiety disorder, unspecified: Secondary | ICD-10-CM

## 2021-03-14 DIAGNOSIS — Z3A09 9 weeks gestation of pregnancy: Secondary | ICD-10-CM

## 2021-03-14 DIAGNOSIS — Z8669 Personal history of other diseases of the nervous system and sense organs: Secondary | ICD-10-CM | POA: Diagnosis not present

## 2021-03-14 NOTE — Progress Notes (Signed)
PHQ 9: Score 2 GAD: Score 0 

## 2021-03-14 NOTE — Progress Notes (Signed)
Subjective:   Sarah Gonzales is a 30 y.o. G1P0000 at [redacted]w[redacted]d by LMP being seen today for her first obstetrical visit.  Her obstetrical history is significant for obesity and chronic hypertension  and has Migraine headache; ALLERGIC RHINITIS CAUSE UNSPECIFIED; DYSPEPSIA; BURSITIS, RIGHT SHOULDER; HYPERHIDROSIS; IBS (irritable bowel syndrome); Hypertension; Mild sleep apnea; Obesity, Class II, BMI 35-39.9; Depression; Chronic bilateral low back pain without sciatica; IUD (intrauterine device) in place; and Abnormal vaginal bleeding on their problem list.. Patient does intend to breast feed. This was a planned pregnancy. Pregnancy history fully reviewed.  Patient reports nausea.  HISTORY: OB History  Gravida Para Term Preterm AB Living  1 0 0 0 0 0  SAB IAB Ectopic Multiple Live Births  0 0 0 0 0    # Outcome Date GA Lbr Len/2nd Weight Sex Delivery Anes PTL Lv  1 Current            Past Medical History:  Diagnosis Date   Anxiety    Bartholin cyst    Chronic headaches    Since the age of 22, severe in the past.   Hypertension    Warts    History reviewed. No pertinent surgical history. Family History  Problem Relation Age of Onset   Hypertension Mother    Hypertension Father    Sleep apnea Father    Asthma Other        aunt   Bipolar disorder Maternal Aunt    Hypertension Maternal Grandfather    Lung cancer Maternal Grandfather    Hypertension Maternal Grandmother    Hypertension Paternal Grandfather    Hypertension Paternal Grandmother    Social History   Tobacco Use   Smoking status: Never   Smokeless tobacco: Never  Vaping Use   Vaping Use: Never used  Substance Use Topics   Alcohol use: Yes    Alcohol/week: 2.0 standard drinks    Types: 2 Standard drinks or equivalent per week    Comment: socially   Drug use: No   No Known Allergies Current Outpatient Medications on File Prior to Visit  Medication Sig Dispense Refill   FLUoxetine (PROZAC) 20 MG capsule Take  2 capsules by mouth once daily 60 capsule 3   propranolol ER (INDERAL LA) 60 MG 24 hr capsule Take 1 capsule (60 mg total) by mouth daily. 90 capsule 1   No current facility-administered medications on file prior to visit.   Indications for ASA therapy (per uptodate) One of the following: Previous pregnancy with preeclampsia, especially early onset and with an adverse outcome No Multifetal gestation No Chronic hypertension Yes Type 1 or 2 diabetes mellitus No Chronic kidney disease No Autoimmune disease (antiphospholipid syndrome, systemic lupus erythematosus) No  Two or more of the following: Nulliparity Yes Obesity (body mass index >30 kg/m2) Yes Family history of preeclampsia in mother or sister No Age ?35 years No Sociodemographic characteristics (African American race, low socioeconomic level) No Personal risk factors (eg, previous pregnancy with low birth weight or small for gestational age infant, previous adverse pregnancy outcome [eg, stillbirth], interval >10 years between pregnancies) No  Indications for early 1 hour GTT (per uptodate)  BMI >25 (>23 in Asian women) AND one of the following  Gestational diabetes mellitus in a previous pregnancy No Glycated hemoglobin ?5.7 percent (39 mmol/mol), impaired glucose tolerance, or impaired fasting glucose on previous testing No First-degree relative with diabetes No High-risk race/ethnicity (eg, African American, Latino, Native American, [place] American, Pacific Islander) No History  of cardiovascular disease No Hypertension or on therapy for hypertension Yes High-density lipoprotein cholesterol level <35 mg/dL (8.85 mmol/L) and/or a triglyceride level >250 mg/dL (0.27 mmol/L) No Polycystic ovary syndrome No Physical inactivity No Other clinical condition associated with insulin resistance (eg, severe obesity, acanthosis nigricans) No Previous birth of an infant weighing ?4000 g No Previous stillbirth of unknown cause  No  Exam   Vitals:   03/14/21 0933  BP: 134/88  Pulse: 83  Weight: 230 lb (104.3 kg)    Uterus:     Pelvic Exam: Perineum: deferred   Vulva: deferred   Vagina:  deferred   Cervix: deferred   Adnexa: deferred   Bony Pelvis: deferred  System: General: well-developed, well-nourished female in no acute distress   Breast:  normal appearance, no masses or tenderness   Skin: normal coloration and turgor, no rashes   Neurologic: oriented, normal, negative, normal mood   Extremities: normal strength, tone, and muscle mass, ROM of all joints is normal   HEENT PERRLA, extraocular movement intact and sclera clear, anicteric   Mouth/Teeth mucous membranes moist, pharynx normal without lesions and dental hygiene good   Neck supple and no masses   Cardiovascular: regular rate   Respiratory:  no respiratory distress, normal breath sounds   Abdomen: soft, non-tender; bowel sounds normal; no masses,  no organomegaly     Assessment:   Pregnancy: G1P0000 Patient Active Problem List   Diagnosis Date Noted   IUD (intrauterine device) in place 12/05/2020   Abnormal vaginal bleeding 12/05/2020   Chronic bilateral low back pain without sciatica 07/09/2019   Depression 08/02/2016   Obesity, Class II, BMI 35-39.9 12/14/2015   Mild sleep apnea 12/05/2015   Hypertension 08/25/2015   IBS (irritable bowel syndrome) 01/16/2011   BURSITIS, RIGHT SHOULDER 11/30/2009   ALLERGIC RHINITIS CAUSE UNSPECIFIED 08/26/2008   DYSPEPSIA 08/26/2008   HYPERHIDROSIS 08/26/2008   Migraine headache 07/07/2008     Plan:  1. Encounter for supervision of normal first pregnancy in first trimester - New OB. Doing well. No concerns. Has some nausea, but well controlled with OTC measures such as Seabands and ginger gum. Declines medication at this time.   - Obstetric panel - HIV antibody (with reflex) - Hepatitis C Antibody - Hemoglobin A1c - Culture, OB Urine - GC/Chlamydia probe amp (Early)not at [facility] -  Babyscripts Schedule Optimization - [name] bedside; Future  2. Chronic hypertension affecting pregnancy - BP stable. Uses Inderal for both cHTN and migraine prevention - Baseline labs obtained  3. Anxiety - Stable, on fluoxetine   4. History of migraine during pregnancy - Stable  Initial labs drawn. Continue prenatal vitamins. Discussed and offered genetic screening options, including Quad screen/AFP, NIPS testing, and option to decline testing. Benefits/risks/alternatives reviewed. Pt aware that anatomy US is form of genetic screening with lower accuracy in detecting trisomies than blood work.  Pt chooses/declines genetic screening today. NIPS: undecided. Ultrasound discussed; fetal anatomic survey: requested. Problem list reviewed and updated. The nature of [facility] with multiple MDs and other Advanced Practice Providers was explained to patient; also emphasized that residents, students are part of our team. Routine obstetric precautions reviewed. Return in about 4 weeks (around 04/11/2021).     Brand Males, CNM 03/14/21 11:52 AM

## 2021-03-14 NOTE — Progress Notes (Signed)
DATING AND VIABILITY SONOGRAM   Sarah Gonzales is a 30 y.o. year old G1P0000 with LMP Patient's last menstrual period was 01/05/2021. which would correlate to  [redacted]w[redacted]d weeks gestation.  She has regular menstrual cycles.   She is here today for a confirmatory initial sonogram.    GESTATION: SINGLETON-yes     FETAL ACTIVITY:          Heart rate         168 BPM          The fetus is active.          ADNEXA: The ovaries are normal. With a RT CL noted   GESTATIONAL AGE AND  BIOMETRICS:  Gestational criteria: Estimated Date of Delivery: 10/12/21 by early ultrasound now at [redacted]w[redacted]d  Previous Scans:0            CROWN RUMP LENGTH           27.72 mm         9 weeks 4 d                                                                               AVERAGE EGA(BY THIS SCAN):  9 weeks 4d  WORKING EDD( early ultrasound ):  10/13/21     TECHNICIAN COMMENTS:  Normal appearing single IUP with FHT of 168 BPM   A copy of this report including all images has been saved and backed up to a second source for retrieval if needed. All measures and details of the anatomical scan, placentation, fluid volume and pelvic anatomy are contained in that report.  Granville Lewis 03/14/2021 10:03 AM

## 2021-03-15 ENCOUNTER — Encounter: Payer: Commercial Managed Care - PPO | Admitting: Obstetrics & Gynecology

## 2021-03-15 LAB — GC/CHLAMYDIA PROBE AMP (~~LOC~~) NOT AT ARMC
Chlamydia: NEGATIVE
Comment: NEGATIVE
Comment: NORMAL
Neisseria Gonorrhea: NEGATIVE

## 2021-03-15 LAB — PROTEIN / CREATININE RATIO, URINE
Creatinine, Urine: 84 mg/dL (ref 20–275)
Creatinine, Urine: 78 mg/dL (ref 20–275)
Protein/Creat Ratio: 119 mg/g creat (ref 21–161)
Protein/Creat Ratio: 103 mg/g creat (ref 21–161)
Protein/Creatinine Ratio: 0.119 mg/mg creat (ref 0.021–0.161)
Protein/Creatinine Ratio: 0.103 mg/mg creat (ref 0.021–0.161)
Total Protein, Urine: 10 mg/dL (ref 5–24)
Total Protein, Urine: 8 mg/dL (ref 5–24)

## 2021-03-15 LAB — COMPLETE METABOLIC PANEL WITH GFR
AG Ratio: 1.8 (calc) (ref 1.0–2.5)
ALT: 32 U/L — ABNORMAL HIGH (ref 6–29)
AST: 17 U/L (ref 10–30)
Albumin: 4.4 g/dL (ref 3.6–5.1)
Alkaline phosphatase (APISO): 63 U/L (ref 31–125)
BUN: 9 mg/dL (ref 7–25)
CO2: 22 mmol/L (ref 20–32)
Calcium: 9.7 mg/dL (ref 8.6–10.2)
Chloride: 102 mmol/L (ref 98–110)
Creat: 0.5 mg/dL (ref 0.50–0.97)
Globulin: 2.5 g/dL (calc) (ref 1.9–3.7)
Glucose, Bld: 83 mg/dL (ref 65–99)
Potassium: 3.7 mmol/L (ref 3.5–5.3)
Sodium: 135 mmol/L (ref 135–146)
Total Bilirubin: 0.4 mg/dL (ref 0.2–1.2)
Total Protein: 6.9 g/dL (ref 6.1–8.1)
eGFR: 129 mL/min/{1.73_m2} (ref >=60)

## 2021-03-15 LAB — OBSTETRIC PANEL
Absolute Monocytes: 510 cells/uL (ref 200–950)
Antibody Screen: NOT DETECTED
Basophils Absolute: 20 cells/uL (ref 0–200)
Basophils Relative: 0.2 %
Eosinophils Absolute: 112 cells/uL (ref 15–500)
Eosinophils Relative: 1.1 %
HCT: 44.7 % (ref 35.0–45.0)
Hemoglobin: 14.6 g/dL (ref 11.7–15.5)
Hepatitis B Surface Ag: NONREACTIVE
Lymphs Abs: 1499 cells/uL (ref 850–3900)
MCH: 26.6 pg — ABNORMAL LOW (ref 27.0–33.0)
MCHC: 32.7 g/dL (ref 32.0–36.0)
MCV: 81.4 fL (ref 80.0–100.0)
MPV: 10 fL (ref 7.5–12.5)
Monocytes Relative: 5 %
Neutro Abs: 8058 cells/uL — ABNORMAL HIGH (ref 1500–7800)
Neutrophils Relative %: 79 %
Platelets: 323 10*3/uL (ref 140–400)
RBC: 5.49 10*6/uL — ABNORMAL HIGH (ref 3.80–5.10)
RDW: 13.2 % (ref 11.0–15.0)
RPR Ser Ql: NONREACTIVE
Rubella: 6.16 Index
Total Lymphocyte: 14.7 %
WBC: 10.2 10*3/uL (ref 3.8–10.8)

## 2021-03-15 LAB — HEMOGLOBIN A1C
Hgb A1c MFr Bld: 4.9 % of total Hgb (ref <5.7)
Mean Plasma Glucose: 94 mg/dL
eAG (mmol/L): 5.2 mmol/L

## 2021-03-15 LAB — HIV ANTIBODY (ROUTINE TESTING W REFLEX): HIV 1&2 Ab, 4th Generation: NONREACTIVE

## 2021-03-15 LAB — HEPATITIS C ANTIBODY
Hepatitis C Ab: NONREACTIVE
SIGNAL TO CUT-OFF: 0.01 (ref <1.00)

## 2021-03-16 ENCOUNTER — Encounter: Payer: 59 | Admitting: Obstetrics and Gynecology

## 2021-03-16 LAB — URINE CULTURE, OB REFLEX

## 2021-03-16 LAB — CULTURE, OB URINE

## 2021-04-11 ENCOUNTER — Ambulatory Visit (INDEPENDENT_AMBULATORY_CARE_PROVIDER_SITE_OTHER): Payer: Commercial Managed Care - PPO | Admitting: Advanced Practice Midwife

## 2021-04-11 ENCOUNTER — Other Ambulatory Visit: Payer: Self-pay

## 2021-04-11 VITALS — BP 130/84 | HR 88 | Wt 229.0 lb

## 2021-04-11 DIAGNOSIS — Z3A13 13 weeks gestation of pregnancy: Secondary | ICD-10-CM | POA: Diagnosis not present

## 2021-04-11 DIAGNOSIS — O0990 Supervision of high risk pregnancy, unspecified, unspecified trimester: Secondary | ICD-10-CM | POA: Insufficient documentation

## 2021-04-11 DIAGNOSIS — O26891 Other specified pregnancy related conditions, first trimester: Secondary | ICD-10-CM

## 2021-04-11 DIAGNOSIS — Z8669 Personal history of other diseases of the nervous system and sense organs: Secondary | ICD-10-CM

## 2021-04-11 DIAGNOSIS — I10 Essential (primary) hypertension: Secondary | ICD-10-CM | POA: Insufficient documentation

## 2021-04-11 DIAGNOSIS — O99340 Other mental disorders complicating pregnancy, unspecified trimester: Secondary | ICD-10-CM | POA: Insufficient documentation

## 2021-04-11 DIAGNOSIS — O0991 Supervision of high risk pregnancy, unspecified, first trimester: Secondary | ICD-10-CM | POA: Diagnosis not present

## 2021-04-11 DIAGNOSIS — F339 Major depressive disorder, recurrent, unspecified: Secondary | ICD-10-CM | POA: Insufficient documentation

## 2021-04-11 DIAGNOSIS — R12 Heartburn: Secondary | ICD-10-CM

## 2021-04-11 DIAGNOSIS — O10919 Unspecified pre-existing hypertension complicating pregnancy, unspecified trimester: Secondary | ICD-10-CM | POA: Insufficient documentation

## 2021-04-11 DIAGNOSIS — Z8759 Personal history of other complications of pregnancy, childbirth and the puerperium: Secondary | ICD-10-CM

## 2021-04-11 DIAGNOSIS — F32A Depression, unspecified: Secondary | ICD-10-CM

## 2021-04-11 DIAGNOSIS — O10911 Unspecified pre-existing hypertension complicating pregnancy, first trimester: Secondary | ICD-10-CM

## 2021-04-11 DIAGNOSIS — F419 Anxiety disorder, unspecified: Secondary | ICD-10-CM

## 2021-04-11 NOTE — Addendum Note (Signed)
Addended by: Granville Lewis on: 04/11/2021 04:04 PM   Modules accepted: Orders

## 2021-04-11 NOTE — Progress Notes (Signed)
   PRENATAL VISIT NOTE  Subjective:  Sarah Gonzales is a 30 y.o. G1P0000 at [redacted]w[redacted]d being seen today for ongoing prenatal care.  She is currently monitored for the following issues for this low-risk pregnancy and has Migraine headache; ALLERGIC RHINITIS CAUSE UNSPECIFIED; BURSITIS, RIGHT SHOULDER; HYPERHIDROSIS; IBS (irritable bowel syndrome); Hypertension; Mild sleep apnea; Obesity, Class II, BMI 35-39.9; Depression; and Chronic bilateral low back pain without sciatica on their problem list.  Patient reports heartburn.  Contractions: Not present. Vag. Bleeding: None.  Movement: Absent. Denies leaking of fluid.   The following portions of the patient's history were reviewed and updated as appropriate: allergies, current medications, past family history, past medical history, past social history, past surgical history and problem list.   Objective:   Vitals:   04/11/21 1509  BP: 130/84  Pulse: 88  Weight: 229 lb (103.9 kg)    Fetal Status:     Movement: Absent     General:  Alert, oriented and cooperative. Patient is in no acute distress.  Skin: Skin is warm and dry. No rash noted.   Cardiovascular: Normal heart rate noted  Respiratory: Normal respiratory effort, no problems with respiration noted  Abdomen: Soft, gravid, appropriate for gestational age.  Pain/Pressure: Present     Pelvic: Cervical exam deferred        Extremities: Normal range of motion.  Edema: None  Mental Status: Normal mood and affect. Normal behavior. Normal judgment and thought content.   Assessment and Plan:  Pregnancy: G1P0000 at [redacted]w[redacted]d 1. [redacted] weeks gestation of pregnancy  2. Supervision of high risk pregnancy, antepartum --Anticipatory guidance about next visits/weeks of pregnancy given. --Reviewed normal pregnancy symptoms including nasal congestion, indigestion. Reviewed solutions/management. --Next visit in 6 weeks at pt request, Babyscripts schedule, discussed need for more frequent visits with [facility] as  pregnancy progresses --Add BASA daily to prevent preeclampsia  3. Chronic hypertension affecting pregnancy --Myrna Blazer for migraines and HTN --Consider changing to or adding labetalol if adjustments for HTN are needed  4. Anxiety --Stable on Prozac  5. History of migraine during pregnancy --Takes Inderol for migraines and HTN   7. Heartburn during pregnancy in first trimester --Discussed dietary changes --Pepcid OTC PRN  Preterm labor symptoms and general obstetric precautions including but not limited to vaginal bleeding, contractions, leaking of fluid and fetal movement were reviewed in detail with the patient. Please refer to After Visit Summary for other counseling recommendations.   No follow-ups on file.  Future Appointments  Date Time Provider Department Center  04/17/2021  3:00 PM Waymon Budge, MD [facility]    Sharen Counter, CNM

## 2021-04-17 ENCOUNTER — Other Ambulatory Visit: Payer: Self-pay

## 2021-04-17 ENCOUNTER — Ambulatory Visit (HOSPITAL_BASED_OUTPATIENT_CLINIC_OR_DEPARTMENT_OTHER): Payer: Commercial Managed Care - PPO | Attending: Family Medicine | Admitting: Internal Medicine

## 2021-04-17 VITALS — Ht 65.0 in | Wt 229.0 lb

## 2021-04-17 DIAGNOSIS — R0683 Snoring: Secondary | ICD-10-CM

## 2021-04-17 DIAGNOSIS — G4733 Obstructive sleep apnea (adult) (pediatric): Secondary | ICD-10-CM

## 2021-04-17 DIAGNOSIS — G4730 Sleep apnea, unspecified: Secondary | ICD-10-CM

## 2021-04-19 DIAGNOSIS — O0990 Supervision of high risk pregnancy, unspecified, unspecified trimester: Secondary | ICD-10-CM

## 2021-04-23 DIAGNOSIS — R0683 Snoring: Secondary | ICD-10-CM

## 2021-04-23 NOTE — Procedures (Signed)
    Patient Name: Sarah Gonzales, Sarah Gonzales Date: 04/17/2021 Gender: Female D.O.B: 20-Apr-1991 Age (years): 30 Referring Provider: Nani Gasser Height (inches): 65 Interpreting Physician: Jetty Duhamel MD, ABSM Weight (lbs): 237 RPSGT: Elbert Sink BMI: 39 MRN: [id number] Neck Size: 15.00  CLINICAL INFORMATION Sleep Study Type: HST Indication for sleep study: Excessive Daytime Sleepiness, Morning Headaches, Snoring Epworth Sleepiness Score: 5  Most recent polysomnogram dated 11/28/2015 revealed an AHI of 5.4/h and RDI of 17.5/h. SLEEP STUDY TECHNIQUE A multi-channel overnight portable sleep study was performed. The channels recorded were: nasal airflow, thoracic respiratory movement, and oxygen saturation with a pulse oximetry. Snoring was also monitored.  MEDICATIONS Patient self administered medications include: none reported.  SLEEP ARCHITECTURE Patient was studied for 457.5 minutes. The sleep efficiency was 100.0 % and the patient was supine for 45.1%. The arousal index was 0.0 per hour.  RESPIRATORY PARAMETERS The overall AHI was 6.2 per hour, with a central apnea index of 0 per hour. The oxygen nadir was 87% during sleep.  CARDIAC DATA Mean heart rate during sleep was 75.3 bpm.  IMPRESSIONS - Mild obstructive sleep apnea occurred during this study (AHI = 6.2/h). - Mild oxygen desaturation was noted during this study (Min O2 = 87%). Mean O2 saturation 95%. - Patient snored.  DIAGNOSIS - Obstructive Sleep Apnea (G47.33)  RECOMMENDATIONS - Treatment for mild OSA is based on symptoms.  Conservative measures may include observation, weight loss and sleep position off back. Other options including CPAP, a fitted oral appliance, or ENT evaluation, would be based on clinical judgment. - Be careful with alcohol, sedatives and other CNS depressants that may worsen sleep apnea and disrupt normal sleep architecture. - Sleep hygiene should be reviewed to assess factors  that may improve sleep quality. - Weight management and regular exercise should be initiated or continued.  [Electronically signed] 04/23/2021 10:19 AM  Jetty Duhamel MD, ABSM Diplomate, American Board of Sleep Medicine   NPI: [id number]                         Jetty Duhamel Diplomate, American Board of Sleep Medicine  ELECTRONICALLY SIGNED ON:  04/23/2021, 10:11 AM [facility] PH: [phone]   FX: [phone] ACCREDITED BY THE AMERICAN ACADEMY OF SLEEP MEDICINE

## 2021-04-24 ENCOUNTER — Encounter: Payer: Self-pay | Admitting: *Deleted

## 2021-04-25 ENCOUNTER — Telehealth: Payer: Self-pay | Admitting: Family Medicine

## 2021-04-25 NOTE — Telephone Encounter (Signed)
Pls call pt and schedule appt to go over sleep results.  Ok for virtual if she would like

## 2021-04-26 NOTE — Telephone Encounter (Signed)
Called pt and scheduled appt for 05/28/2021 for virtual visit.  Sarah Gonzales, CMA

## 2021-05-01 ENCOUNTER — Telehealth (INDEPENDENT_AMBULATORY_CARE_PROVIDER_SITE_OTHER): Payer: Commercial Managed Care - PPO | Admitting: Family Medicine

## 2021-05-01 ENCOUNTER — Encounter: Payer: Self-pay | Admitting: Family Medicine

## 2021-05-01 DIAGNOSIS — G4730 Sleep apnea, unspecified: Secondary | ICD-10-CM

## 2021-05-01 DIAGNOSIS — R0683 Snoring: Secondary | ICD-10-CM | POA: Diagnosis not present

## 2021-05-01 NOTE — Assessment & Plan Note (Signed)
Sleep study this time revealed an AHI of 6.2.  She did have mild oxygen desaturation noted during the study it dropped as low as 87's percent with a mean was 95%.  Discussed treatment options which could include observation with weight loss and sleeping off of her back.  Other options include CPAP or fitted oral appliance or ENT evaluation if she has large tonsils etc.  We discussed that we could certainly work on the weight after her pregnancy and see if that makes a difference she would also like to consider an oral appliance so encouraged her to check with her dentist to see if her dentist provides these if not then I do have a couple of dentists in the [place] area who do the custom oral appliance.

## 2021-05-01 NOTE — Progress Notes (Signed)
Established Patient Office Visit  Subjective:  Patient ID: Sarah Gonzales, female    DOB: September 06, 1990  Age: 30 y.o. MRN: [id number]  CC:  Chief Complaint  Patient presents with   Results    Sleep study results    HPI [facility] presents for sleep study results. She feels she sleep well but fees her energy levels are lower.   She is now [redacted] weeks pregnant and is having a boy.  Last study was in 2017 with an AHI of about 5.  Sleep Study Type: HST Indication for sleep study: Excessive Daytime Sleepiness, Morning Headaches, Snoring Epworth Sleepiness Score: 5   Most recent polysomnogram dated 11/28/2015 revealed an AHI of 5.4/h and RDI of 17.5/h. SLEEP STUDY TECHNIQUE A multi-channel overnight portable sleep study was performed. The channels recorded were: nasal airflow, thoracic respiratory movement, and oxygen saturation with a pulse oximetry. Snoring was also monitored.   MEDICATIONS Patient self administered medications include: none reported.   SLEEP ARCHITECTURE Patient was studied for 457.5 minutes. The sleep efficiency was 100.0 % and the patient was supine for 45.1%. The arousal index was 0.0 per hour.   RESPIRATORY PARAMETERS The overall AHI was 6.2 per hour, with a central apnea index of 0 per hour. The oxygen nadir was 87% during sleep.   CARDIAC DATA Mean heart rate during sleep was 75.3 bpm.   IMPRESSIONS - Mild obstructive sleep apnea occurred during this study (AHI = 6.2/h). - Mild oxygen desaturation was noted during this study (Min O2 = 87%). Mean O2 saturation 95%. - Patient snored.   DIAGNOSIS - Obstructive Sleep Apnea (G47.33)   RECOMMENDATIONS - Treatment for mild OSA is based on symptoms.  Conservative measures may include observation, weight loss and sleep position off back. Other options including CPAP, a fitted oral appliance, or ENT evaluation, would be based on clinical judgment. - Be careful with alcohol, sedatives and other CNS depressants that  may worsen sleep apnea and disrupt normal sleep architecture. - Sleep hygiene should be reviewed to assess factors that may improve sleep quality. - Weight management and regular exercise should be initiated or continued.   [Electronically signed] 04/23/2021 10:19 AM   Baird Lyons MD, ABSM Diplomate, American Board of Sleep Medicine  Past Medical History:  Diagnosis Date   Anxiety    Bartholin cyst    Chronic headaches    Since the age of 38, severe in the past.   Hypertension    Warts     History reviewed. No pertinent surgical history.  Family History  Problem Relation Age of Onset   Hypertension Mother    Hypertension Father    Sleep apnea Father    Asthma Other        aunt   Bipolar disorder Maternal Aunt    Hypertension Maternal Grandfather    Lung cancer Maternal Grandfather    Hypertension Maternal Grandmother    Hypertension Paternal Grandfather    Hypertension Paternal Grandmother     Social History   Socioeconomic History   Marital status: Single    Spouse name: Not on file   Number of children: Not on file   Years of education: Not on file   Highest education level: Not on file  Occupational History   Occupation: server  Tobacco Use   Smoking status: Never   Smokeless tobacco: Never  Vaping Use   Vaping Use: Never used  Substance and Sexual Activity   Alcohol use: Yes    Alcohol/week: 2.0 standard  drinks    Types: 2 Standard drinks or equivalent per week    Comment: socially   Drug use: No   Sexual activity: Yes    Partners: Male    Birth control/protection: I.U.D.    Comment: lives with family, student at [employer], involved in drama  Other Topics Concern   Not on file  Social History Narrative   Not on file   Social Determinants of Health   Financial Resource Strain: Not on file  Food Insecurity: Not on file  Transportation Needs: Not on file  Physical Activity: Not on file  Stress: Not on file  Social Connections: Not on file   Intimate Partner Violence: Not on file    Outpatient Medications Prior to Visit  Medication Sig Dispense Refill   FLUoxetine (PROZAC) 20 MG capsule Take 2 capsules by mouth once daily 60 capsule 3   Prenatal Vit-Fe Fumarate-FA (MULTIVITAMIN-PRENATAL) 27-0.8 MG TABS tablet Take 1 tablet by mouth daily at 12 noon.     propranolol ER (INDERAL LA) 60 MG 24 hr capsule Take 1 capsule (60 mg total) by mouth daily. 90 capsule 1   No facility-administered medications prior to visit.    No Known Allergies  ROS Review of Systems    Objective:    Physical Exam  LMP 01/05/2021  Wt Readings from Last 3 Encounters:  04/17/21 229 lb (103.9 kg)  04/11/21 229 lb (103.9 kg)  03/14/21 230 lb (104.3 kg)     There are no preventive care reminders to display for this patient.  There are no preventive care reminders to display for this patient.  Lab Results  Component Value Date   TSH 3.48 12/05/2020   Lab Results  Component Value Date   WBC 10.2 03/14/2021   HGB 14.6 03/14/2021   HCT 44.7 03/14/2021   MCV 81.4 03/14/2021   PLT 323 03/14/2021   Lab Results  Component Value Date   NA 135 03/14/2021   K 3.7 03/14/2021   CO2 22 03/14/2021   GLUCOSE 83 03/14/2021   BUN 9 03/14/2021   CREATININE 0.50 03/14/2021   BILITOT 0.4 03/14/2021   ALKPHOS 77 12/31/2016   AST 17 03/14/2021   ALT 32 (H) 03/14/2021   PROT 6.9 03/14/2021   ALBUMIN 4.4 12/31/2016   CALCIUM 9.7 03/14/2021   EGFR 129 03/14/2021   Lab Results  Component Value Date   CHOL 190 02/28/2021   Lab Results  Component Value Date   HDL 45 (L) 02/28/2021   Lab Results  Component Value Date   LDLCALC 116 (H) 02/28/2021   Lab Results  Component Value Date   TRIG 169 (H) 02/28/2021   Lab Results  Component Value Date   CHOLHDL 4.2 02/28/2021   Lab Results  Component Value Date   HGBA1C 4.9 03/14/2021      Assessment & Plan:   Problem List Items Addressed This Visit       Respiratory   Mild sleep  apnea    Sleep study this time revealed an AHI of 6.2.  She did have mild oxygen desaturation noted during the study it dropped as low as 87's percent with a mean was 95%.  Discussed treatment options which could include observation with weight loss and sleeping off of her back.  Other options include CPAP or fitted oral appliance or ENT evaluation if she has large tonsils etc.  We discussed that we could certainly work on the weight after her pregnancy and see if that makes  a difference she would also like to consider an oral appliance so encouraged her to check with her dentist to see if her dentist provides these if not then I do have a couple of dentists in the [place] area who do the custom oral appliance.      Other Visit Diagnoses     Snoring    -  Primary       She will think about her options and let me know.    No orders of the defined types were placed in this encounter.   Follow-up: No follow-ups on file.    Beatrice Lecher, MD

## 2021-05-15 ENCOUNTER — Telehealth: Payer: Self-pay | Admitting: *Deleted

## 2021-05-15 NOTE — Telephone Encounter (Signed)
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 Patient advised by clinical staff not to have contact with the children until they are cleared from being contagious.

## 2021-05-18 NOTE — Telephone Encounter (Signed)
Pt called several times due to nursing line prompt.  Nursing line had no information on the nature of the call.  Call went to voicemail x 2.   Mariel Aloe, MD

## 2021-05-23 ENCOUNTER — Encounter: Payer: Self-pay | Admitting: *Deleted

## 2021-05-23 ENCOUNTER — Ambulatory Visit: Payer: Commercial Managed Care - PPO | Admitting: *Deleted

## 2021-05-23 ENCOUNTER — Other Ambulatory Visit: Payer: Self-pay

## 2021-05-23 ENCOUNTER — Other Ambulatory Visit: Payer: Self-pay | Admitting: *Deleted

## 2021-05-23 ENCOUNTER — Ambulatory Visit (INDEPENDENT_AMBULATORY_CARE_PROVIDER_SITE_OTHER): Payer: Commercial Managed Care - PPO | Admitting: Medical

## 2021-05-23 ENCOUNTER — Encounter: Payer: Self-pay | Admitting: Medical

## 2021-05-23 ENCOUNTER — Ambulatory Visit: Payer: Commercial Managed Care - PPO | Attending: Advanced Practice Midwife

## 2021-05-23 VITALS — BP 117/72 | HR 78 | Wt 221.0 lb

## 2021-05-23 VITALS — BP 116/62 | HR 79

## 2021-05-23 DIAGNOSIS — E669 Obesity, unspecified: Secondary | ICD-10-CM

## 2021-05-23 DIAGNOSIS — G8929 Other chronic pain: Secondary | ICD-10-CM

## 2021-05-23 DIAGNOSIS — Z3689 Encounter for other specified antenatal screening: Secondary | ICD-10-CM | POA: Insufficient documentation

## 2021-05-23 DIAGNOSIS — O0990 Supervision of high risk pregnancy, unspecified, unspecified trimester: Secondary | ICD-10-CM | POA: Diagnosis not present

## 2021-05-23 DIAGNOSIS — O99340 Other mental disorders complicating pregnancy, unspecified trimester: Secondary | ICD-10-CM

## 2021-05-23 DIAGNOSIS — O0992 Supervision of high risk pregnancy, unspecified, second trimester: Secondary | ICD-10-CM

## 2021-05-23 DIAGNOSIS — Z23 Encounter for immunization: Secondary | ICD-10-CM | POA: Diagnosis not present

## 2021-05-23 DIAGNOSIS — F32A Depression, unspecified: Secondary | ICD-10-CM

## 2021-05-23 DIAGNOSIS — O10919 Unspecified pre-existing hypertension complicating pregnancy, unspecified trimester: Secondary | ICD-10-CM

## 2021-05-23 DIAGNOSIS — O10912 Unspecified pre-existing hypertension complicating pregnancy, second trimester: Secondary | ICD-10-CM

## 2021-05-23 DIAGNOSIS — Z362 Encounter for other antenatal screening follow-up: Secondary | ICD-10-CM

## 2021-05-23 DIAGNOSIS — G43009 Migraine without aura, not intractable, without status migrainosus: Secondary | ICD-10-CM

## 2021-05-23 DIAGNOSIS — M545 Low back pain, unspecified: Secondary | ICD-10-CM

## 2021-05-23 DIAGNOSIS — Z3A19 19 weeks gestation of pregnancy: Secondary | ICD-10-CM

## 2021-05-23 DIAGNOSIS — K580 Irritable bowel syndrome with diarrhea: Secondary | ICD-10-CM

## 2021-05-23 DIAGNOSIS — R638 Other symptoms and signs concerning food and fluid intake: Secondary | ICD-10-CM

## 2021-05-23 NOTE — Progress Notes (Signed)
   PRENATAL VISIT NOTE  Subjective:  Sarah Gonzales is a 30 y.o. G1P0000 at [redacted]w[redacted]d being seen today for ongoing prenatal care.  She is currently monitored for the following issues for this high-risk pregnancy and has Migraine headache; ALLERGIC RHINITIS CAUSE UNSPECIFIED; BURSITIS, RIGHT SHOULDER; HYPERHIDROSIS; IBS (irritable bowel syndrome); Hypertension; Mild sleep apnea; Obesity, Class II, BMI 35-39.9; Depression; Chronic bilateral low back pain without sciatica; Chronic hypertension affecting pregnancy; Supervision of high risk pregnancy, antepartum; and Depression affecting pregnancy on their problem list.  Patient reports backache and carpal tunnel symptoms.  Contractions: Not present. Vag. Bleeding: None.  Movement: Absent. Denies leaking of fluid.   The following portions of the patient's history were reviewed and updated as appropriate: allergies, current medications, past family history, past medical history, past social history, past surgical history and problem list.   Objective:   Vitals:   05/23/21 1332  BP: 117/72  Pulse: 78  Weight: 221 lb (100.2 kg)    Fetal Status: Fetal Heart Rate (bpm): 140   Movement: Absent     General:  Alert, oriented and cooperative. Patient is in no acute distress.  Skin: Skin is warm and dry. No rash noted.   Cardiovascular: Normal heart rate noted  Respiratory: Normal respiratory effort, no problems with respiration noted  Abdomen: Soft, gravid, appropriate for gestational age.  Pain/Pressure: Absent     Pelvic: Cervical exam deferred        Extremities: Normal range of motion.  Edema: None  Mental Status: Normal mood and affect. Normal behavior. Normal judgment and thought content.   Assessment and Plan:  Pregnancy: G1P0000 at [redacted]w[redacted]d 1. Supervision of high risk pregnancy, antepartum - Alpha fetoprotein, maternal - Planning to breastfeed - Still unsure about MOC, options discussed safe with breastfeeding - Planning inpatient  circumcision - Desires flu vaccine today  - Peds list given  2. Chronic hypertension affecting pregnancy - Normotensive on Propranolol  - BASA   3. Depression affecting pregnancy - Stable on Prozac  4. Obesity, Class II, BMI 35-39.9 - 8 # weight loss since last visit - Patient denies N/V/D - Has eliminated soda and most sweets  5. Chronic bilateral low back pain without sciatica - Discussed abdominal binder   6. Irritable bowel syndrome with diarrhea - Has had constipation lately, treating with Miralax   7. [redacted] weeks gestation of pregnancy  8. Migraine without aura and without status migrainosus, not intractable - No HA recently  Preterm labor symptoms and general obstetric precautions including but not limited to vaginal bleeding, contractions, leaking of fluid and fetal movement were reviewed in detail with the patient. Please refer to After Visit Summary for other counseling recommendations.   Return in about 4 weeks (around 06/20/2021) for [facility] APP.  Future Appointments  Date Time Provider Department Center  06/21/2021  3:00 PM [facility] NURSE [facility]  06/21/2021  3:15 PM [facility]    Vonzella Nipple, PA-C

## 2021-05-23 NOTE — Addendum Note (Signed)
Addended by: Granville Lewis on: 05/23/2021 02:06 PM   Modules accepted: Orders

## 2021-05-23 NOTE — Patient Instructions (Signed)
AREA PEDIATRIC/FAMILY PRACTICE PHYSICIANS  Central/[place]outheast [place] ([address]) [facility] Chambliss, MD; Eniola, MD; Hale, MD; Hensel, MD; McDiarmid, MD; McIntyer, MD; Neal, MD; Walden, MD [address] [place], [place] [address] [phone] Mon-Fri 8:30-12:30, 1:30-5:00 Providers come to see babies at [facility] Accepting Medicaid [facility] providers who accept newborns: Koirala, MD; Morrow, MD; Wolters, MD [address], [place], [place] [address] [phone] Mon-Fri 8:00-5:30 Babies seen by providers at [facility] Does NOT accept Medicaid Please call early in hospitalization for appointment (limited availability)  [facility] Mulberry, MD [address] [place], [place] [address] [phone] Mon, Tue, Thur, Fri 8:30-5:00, Wed 10:00-7:00 (closed 1-2pm) Babies seen by [facility] providers Accepting Medicaid Rubin - Pediatrician Rubin, MD [address], [place], [place] [address] [phone] Mon-Fri 8:30-5:00, Sat 8:30-12:00 Provider comes to see babies at [facility] Accepting Medicaid Must have been referred from current patients or contacted office prior to delivery [facility] ([facility]) Brown, MD; Chandler, MD; Ettefagh, MD; Grant, MD; Lester, MD; McCormick, MD; McQueen, MD; Prose, MD; Simha, MD; Stanley, MD; Stryffeler, NP; Tebben, NP [address], [place], [place] [address] [phone] Mon, Tue, Thur, Fri 8:30-5:30, Wed 9:30-5:30, Sat 8:30-12:30 Babies seen by [facility] providers Accepting Medicaid Only accepting infants of first-time parents or siblings of current patients Hospital discharge coordinator will make follow-up appointment Jack Amos [address], [place], [place]  [address] [phone]   Fax - [phone] [facility] [address], [place], [place]  [address] Phone - [phone]   Fax - [phone] Shilpa Gosrani [address], [place], [place]  [address] [phone]  East/[place]ortheast [place] ([address]) [facility] Bates, MD; Brassfield, MD; Cooper, Cox, MD; MD; Davis, MD; Dovico, MD; Ettefaugh, MD; Little, MD; Lowe, MD; Keiffer, MD; Melvin, MD; Sumner, MD; Williams, MD [address], [place], [place] [address] [phone] Mon-Fri 8:30-5:00 (extended evenings Mon-Thur as needed), Sat-Sun 10:00-1:00 Providers come to see babies at [facility] Accepting Medicaid for families of first-time babies and families with all children in the household age 3 and under. Must register with office prior to making appointment (M-F only). [facility] Henson, NP; Knapp, MD; Lalonde, MD; Tysinger, PA [address] [place], [place] [address] [phone] Mon-Fri 8:00-5:00 Babies seen by providers at [facility] Does NOT accept Medicaid/Commercial Insurance Only [facility] ([facility])  Artis, MD; Barnes, MD; Bratton, MD; Coccaro, MD; Lockett Gardner, MD; Kramer, MD; Marshall, MD; Netherton, MD; Poleto, MD; Skinner, MD [address] [place], [place] [address] [phone] Mon-Fri 8:30-5:30, Sat (Oct.-Mar.) 9:00-1:00 Babies seen by providers at [facility] Accepting Medicaid  [place] ([address]) [facility] Reid, MD; Warner, MD [address], [place], [place] [address] [phone] Mon-Fri 8:30-5:00, Sat 8:30-12:00 Providers come to see babies at [facility] Does NOT accept Medicaid [facility] Becker, PA; Hagler, MD; Scifres, PA; Sun, MD; Swayne, MD [address], [place],  [address] [phone] Mon-Fri 8:00-5:00 Babies seen by providers at [facility] Does NOT accept Medicaid Only accepting babies of parents who  are patients Please call early in hospitalization for appointment (limited availability)  Pediatricians Clark, MD; Frye, MD; Kelleher, MD; Mack, NP; Miller, MD; O'Keller, MD; Patterson, NP; Pudlo, MD; Puzio, MD; Thomas, MD; Tucker, MD; Twiselton, MD [address], [place], [place] [address] [phone] Mon-Fri 8:00-5:00, Sat 9:00-12:00 Providers come to see babies at [facility] Does NOT accept Medicaid  [place] ([address]) [facility] providers accepting new patients: Brake, NP; Wharton, PA [address], [place], [place] [address] [phone] Mon-Fri 8:00-5:00 Babies seen by providers at [facility] Does NOT accept Medicaid Only accepting babies of parents who are patients Please call early in hospitalization for appointment (limited availability) [facility] Gay, MD; Quinlan, MD [address] [place], [place] [address] [phone] (press [phone] to schedule appointment) Mon-Fri 8:00-5:00 Providers come to see babies at [facility] Does NOT accept Medicaid [facility] Mazer, MD [address] [place], [place] [address] [phone] Mon-Fri 8:30-5:00 (lunch 12:30-1:00), extended hours by appointment only Wed 5:00-6:30 Babies seen by [facility] providers Accepting Medicaid [facility] Banks, MD; Jordan, MD; Koberlein, MD [address], [place], [place] [address] [phone] Mon-Fri 8:00-5:00 Babies seen by [facility] providers Does NOT accept Medicaid [facility] Parker, MD; Hunter, MD; Wallace, DO [address] [place], [place] [address] [phone] Mon-Fri 8:00-5:00 Babies seen by [facility] providers Does NOT accept Medicaid [facility] Brandon, PA; Brecken, PA; Christy, NP; Dees, MD; DeClaire, MD; DeWeese, MD; Hansen, NP; Mills, NP; Parrish, NP; Smoot, NP; Summer, MD; Vapne,  MD [address] [place], [place] [address] [phone] Mon-Fri 8:30-5:00, Sat 10:00-1:00 Providers come to see babies at [facility] Does NOT accept Medicaid Free prenatal information session Tuesdays at 4:45pm [facility] Bouska, MD; Gordon, PA; Jeffery, PA; Weber, PA [address] [place] [address] [phone] Mon-Fri 7:30-5:30 Babies seen by [facility] providers [facility] [address], [place], [place]  [address] [phone]   Fax - [phone]  [place] ([address] & [address]) [facility] [place] [address] [place], [place] [address] [phone] Mon-Thur 8:00-6:00 Providers come to see babies at [facility] Accepting Medicaid [facility] Anderson, NP; Badger, MD; Beal, PA; [place], PA [address] [place], [place] [address] [phone] Mon-Thur 7:30-7:30, Fri 7:30-4:30 Babies seen by [facility] providers Accepting Medicaid [facility] Agbuya, MD; Klett, NP; Romgoolam, MD [address], [place], [place] [address] [phone] Mon-Fri 8:30-5:00, Sat 8:30-12:00 Providers come to see babies at [facility] Accepting Medicaid Must have "Meet & Greet" appointment at office prior to delivery [facility] ([facility]) McCord, MD; Wallace, MD; Wood, MD [address], [place], [place] [address] [phone] Mon-Wed 8:00-6:00, Thur-Fri 8:00-5:00, Sat 9:00-12:00 Providers come to see babies at [facility] Does NOT accept Medicaid Only accepting siblings of current patients [facility]  [address], [place], [place]  [address] [phone]   Fax - [phone] [facility] [address], [place], [place]  [address] [phone]   Fax -  [phone]  [place] ([address] & [address]) [facility] Cirigliano, DO; Matthews, DO [address] , [place] [address] [phone] Mon-Fri 7:00-5:00 Babies seen by [facility] providers Does NOT accept Medicaid [facility] Briscoe, MD; Howley, PA; Moreira, PA [address], [place],  [address] [phone] Mon-Fri 8:00-5:00 Babies seen by [facility] providers Accepting Medicaid [facility] Boyd, MD; Church, PA; Jones, NP; Osborn, PA [address], ,  [address] [phone] Mon-Fri 8:00-5:00 Babies seen by providers at [facility] Accepting Medicaid  [place] ([address]) [facility] [place] Wendling, DO [address] [place], [place] [address] [phone] Mon-Fri 8:00-5:00 Babies seen by [facility] providers Does NOT accept Medicaid Limited availability, please call early in hospitalization to schedule follow-up [facility] Calderon, PA; Cummings, MD; Dillard, MD; Martin, PA; Olson, MD; VanDeven, PA [address], [place], [place] [address] [phone] Mon-Fri 8:30-5:00, Sat 9:00-12:00 Babies seen by providers at [facility] Accepting Medicaid Please register online then schedule online or call office [email or web address]) Hunter, NP; Kumar, MD; Martin Rogers, PA [address], [place], [place] [address] [phone] Mon-Fri 8:00-5:00 Babies seen by providers at [facility] Accepting Medicaid [facility] ([facility]) [place], MD; Kristi Fleenor, NP; West, MD [address], [place], [place] [address] [phone] Mon-Fri 8:00-5:30, Sat&Sun by appointment (phones open at  8:30) Babies seen by [facility] providers Accepting Medicaid Must be a first-time baby or sibling of current patient [facility] [place]  [address], [place], [place]  [address] [phone]   Fax - [phone]  [place] ([address]) [facility] Brown, PA; Cowen, PA; Rice, MD; Helton, PA; Spry, MD [address] [place], [place] [address] [phone] Mon-Thur 8:00-7:00, Fri 8:00-5:00, Sat 8:00-12:00, Sun 9:00-12:00 Babies seen by [facility] providers Accepting Medicaid [facility] Coe-Goins, MD; Marshall, MD; Pierre-Louis, MD [address], [place], [place] [address] [phone] Mon-Thur 8:00-5:00 Babies seen by providers at [facility] Accepting Medicaid [facility] Bratton, MD; Coe-Goins, MD; Hayes, MD; Lewis, MD; List, MD; Lott, MD; Marshall, MD; Moran, MD; O'Neal, MD; Pierre-Louis, MD; Pitonzo, MD; Scholer, MD; Spangle, MD [address] [place], [place] [address] [phone] Mon-Fri 8:00-5:30, Sat (Oct.-Mar.) 9:00-1:00 Babies seen by providers at [facility] Accepting Medicaid Must fill out new patient packet, available online at [email or web address] [facility] ([facility]) Friddle, NP; Harris, NP; Kelly, NP; Logan, MD; Melvin, PA; Poth, MD; Ramadoss, MD; Stanton, NP [address], [place], [place] [address] [phone] Mon-Thur 8:00-5:30, Fri 8:00-5:00 Babies seen by providers at [facility] Accepting Medicaid  [place] ([address]) [facility] Dixon, PA; Marshall, MD; Pickard, MD; Tapia, PA [address], [place], [place] [address] [phone] Mon-Fri 8:00-5:00 Babies seen by providers at [facility] Accepting Medicaid   [place] ([address]) [facility] [place] Masneri, DO; Meyers, MD; Nelson, PA [address], [place], [place] [address] [phone] Mon-Fri 8:00-5:00 Babies seen by providers at [facility] Does NOT accept Medicaid Limited appointment availability, please call early in hospitalization  [facility] [place] Kunedd, DO; McGowen, MD [address], [place], [place] [address] [phone] Mon-Fri 8:00-5:00 Babies seen by [facility] providers Does NOT accept Medicaid [facility] [place] Cameron, MD; MacDonald, MD; Michaels, PA; Nayak, MD [address] [place] [address], [place], [place] [address] [phone] Mon-Fri 8:00-5:00 After hours clinic ([address] , [place] [address]) [phone] Mon-Fri 5:00-8:00, Sat 12:00-6:00, Sun 10:00-4:00 Babies seen by [facility] providers Accepting Medicaid [facility] [place] [address], [place], [place]  [address] [phone]   Fax - [phone]  [place] ([address]) [facility] [place] [facility] Andy, MD [address], [place], [place] [address] [phone] Mon-Fri 8:00-5:00 Babies seen by [facility] providers Does NOT accept Medicaid [facility] [place] ([facility] [place]) Eksir, MD [address], [place], [place] [address] [phone] Mon-Thur 8:00-7:00, Fri 8:00-5:00, Sat 8:00-12:00 Babies seen by providers at [facility] Accepting Medicaid - but does not have vaccinations in office (must be received elsewhere) Limited availability, please call early in hospitalization  [place] ([address]) [facility]  Charlene Flemming, MD [address], [place] [address] [phone]  Fax [phone]  [facility]  Kimberly Newton, MD, Annamarie Streilein, PA, Carla Hampton, PA [address] [place], [place]  [address] [phone] [facility]  [address], [place], [place] [address] [phone] [address], [place], [place] [address] [phone] (West Office)  [facility] [address], [place], [place] [address] [phone] [facility] [address], [place], [place] [address] [phone] [facility] [address], [place], [place] [address] [phone] [facility] [address], [place], [place] [address] [phone] [facility] [address], [place], [place] [address] [phone] [facility] [address], [place], [place] [address] [phone] [facility]  [address], [place], [place] [address] [phone] [facility] [address] [place], [place], [place] [address] [phone] [facility] [address], [place], [place] [address] [phone] [facility] [address], [place],  [address] [phone]  

## 2021-06-20 ENCOUNTER — Ambulatory Visit (INDEPENDENT_AMBULATORY_CARE_PROVIDER_SITE_OTHER): Payer: Commercial Managed Care - PPO | Admitting: Obstetrics and Gynecology

## 2021-06-20 ENCOUNTER — Other Ambulatory Visit: Payer: Self-pay

## 2021-06-20 DIAGNOSIS — O0990 Supervision of high risk pregnancy, unspecified, unspecified trimester: Secondary | ICD-10-CM

## 2021-06-20 NOTE — Progress Notes (Signed)
   PRENATAL VISIT NOTE  Subjective:  Sarah Gonzales is a 30 y.o. G1P0000 at [redacted]w[redacted]d being seen today for ongoing prenatal care.  She is currently monitored for the following issues for this high-risk pregnancy and has Migraine headache; ALLERGIC RHINITIS CAUSE UNSPECIFIED; BURSITIS, RIGHT SHOULDER; HYPERHIDROSIS; IBS (irritable bowel syndrome); Hypertension; Mild sleep apnea; Obesity, Class II, BMI 35-39.9; Depression; Chronic bilateral low back pain without sciatica; Chronic hypertension affecting pregnancy; Supervision of high risk pregnancy, antepartum; and Depression affecting pregnancy on their problem list.  Patient reports no complaints.  Contractions: Not present. Vag. Bleeding: None.  Movement: Present. Denies leaking of fluid.   The following portions of the patient's history were reviewed and updated as appropriate: allergies, current medications, past family history, past medical history, past social history, past surgical history and problem list.   Objective:   Vitals:   06/20/21 0904  BP: 123/78  Pulse: 83  Weight: 223 lb (101.2 kg)    Fetal Status: Fetal Heart Rate (bpm): 136   Movement: Present     General:  Alert, oriented and cooperative. Patient is in no acute distress.  Skin: Skin is warm and dry. No rash noted.   Cardiovascular: Normal heart rate noted  Respiratory: Normal respiratory effort, no problems with respiration noted  Abdomen: Soft, gravid, appropriate for gestational age.  Pain/Pressure: Absent     Pelvic: Cervical exam deferred        Extremities: Normal range of motion.  Edema: None  Mental Status: Normal mood and affect. Normal behavior. Normal judgment and thought content.   Assessment and Plan:  Pregnancy: G1P0000 at [redacted]w[redacted]d 1. Supervision of high risk pregnancy, antepartum  Continue BASA  Continue propanolol for BP, BP good.  Hx of chronic lower back pain. Ok to use TENS unit, ok to see chiropractor. Recommend pregnancy support belt.   Preterm  labor symptoms and general obstetric precautions including but not limited to vaginal bleeding, contractions, leaking of fluid and fetal movement were reviewed in detail with the patient. Please refer to After Visit Summary for other counseling recommendations.   Return in about 4 weeks (around 07/18/2021) for 2 hour GTT and TDAP.  Future Appointments  Date Time Provider Department Center  06/21/2021  3:00 PM [facility] NURSE [facility]  06/21/2021  3:15 PM [facility] US2 [facility]  07/18/2021  8:10 AM Brand Males, CNM [facility]    Venia Carbon, NP

## 2021-06-21 ENCOUNTER — Ambulatory Visit: Payer: Commercial Managed Care - PPO

## 2021-06-21 ENCOUNTER — Ambulatory Visit: Payer: Commercial Managed Care - PPO | Attending: Obstetrics and Gynecology

## 2021-06-21 ENCOUNTER — Ambulatory Visit: Payer: Commercial Managed Care - PPO | Admitting: *Deleted

## 2021-06-21 ENCOUNTER — Encounter: Payer: Self-pay | Admitting: *Deleted

## 2021-06-21 VITALS — BP 122/60 | HR 76

## 2021-06-21 DIAGNOSIS — O10012 Pre-existing essential hypertension complicating pregnancy, second trimester: Secondary | ICD-10-CM

## 2021-06-21 DIAGNOSIS — E669 Obesity, unspecified: Secondary | ICD-10-CM

## 2021-06-21 DIAGNOSIS — O99212 Obesity complicating pregnancy, second trimester: Secondary | ICD-10-CM

## 2021-06-21 DIAGNOSIS — Z362 Encounter for other antenatal screening follow-up: Secondary | ICD-10-CM | POA: Diagnosis present

## 2021-06-21 DIAGNOSIS — O10912 Unspecified pre-existing hypertension complicating pregnancy, second trimester: Secondary | ICD-10-CM | POA: Diagnosis present

## 2021-06-21 DIAGNOSIS — Z3A23 23 weeks gestation of pregnancy: Secondary | ICD-10-CM

## 2021-06-21 DIAGNOSIS — R638 Other symptoms and signs concerning food and fluid intake: Secondary | ICD-10-CM | POA: Insufficient documentation

## 2021-06-25 ENCOUNTER — Other Ambulatory Visit: Payer: Self-pay | Admitting: Family Medicine

## 2021-06-26 ENCOUNTER — Other Ambulatory Visit: Payer: Self-pay | Admitting: *Deleted

## 2021-06-26 DIAGNOSIS — Z362 Encounter for other antenatal screening follow-up: Secondary | ICD-10-CM

## 2021-06-26 DIAGNOSIS — Z6839 Body mass index (BMI) 39.0-39.9, adult: Secondary | ICD-10-CM

## 2021-06-26 DIAGNOSIS — O10912 Unspecified pre-existing hypertension complicating pregnancy, second trimester: Secondary | ICD-10-CM

## 2021-07-14 ENCOUNTER — Other Ambulatory Visit: Payer: Self-pay | Admitting: Family Medicine

## 2021-07-18 ENCOUNTER — Ambulatory Visit (INDEPENDENT_AMBULATORY_CARE_PROVIDER_SITE_OTHER): Payer: Commercial Managed Care - PPO

## 2021-07-18 ENCOUNTER — Other Ambulatory Visit: Payer: Self-pay

## 2021-07-18 VITALS — BP 123/72 | HR 84 | Wt 219.0 lb

## 2021-07-18 DIAGNOSIS — Z3A27 27 weeks gestation of pregnancy: Secondary | ICD-10-CM

## 2021-07-18 DIAGNOSIS — O10919 Unspecified pre-existing hypertension complicating pregnancy, unspecified trimester: Secondary | ICD-10-CM

## 2021-07-18 DIAGNOSIS — Z8669 Personal history of other diseases of the nervous system and sense organs: Secondary | ICD-10-CM

## 2021-07-18 DIAGNOSIS — O0990 Supervision of high risk pregnancy, unspecified, unspecified trimester: Secondary | ICD-10-CM

## 2021-07-18 DIAGNOSIS — Z8759 Personal history of other complications of pregnancy, childbirth and the puerperium: Secondary | ICD-10-CM

## 2021-07-18 DIAGNOSIS — G2581 Restless legs syndrome: Secondary | ICD-10-CM

## 2021-07-18 DIAGNOSIS — F419 Anxiety disorder, unspecified: Secondary | ICD-10-CM

## 2021-07-18 NOTE — Patient Instructions (Signed)
[facility] Address: [employer], [address], [place], [place] [address] Phone: [phone]  [facility] Address: [address] [place], [place], [place] [address] Phone: [phone]

## 2021-07-18 NOTE — Progress Notes (Signed)
° °  PRENATAL VISIT NOTE  Subjective:  Sarah Gonzales is a 30 y.o. G1P0000 at 103w5d being seen today for ongoing prenatal care.  She is currently monitored for the following issues for this high-risk pregnancy and has Migraine headache; ALLERGIC RHINITIS CAUSE UNSPECIFIED; BURSITIS, RIGHT SHOULDER; HYPERHIDROSIS; IBS (irritable bowel syndrome); Hypertension; Mild sleep apnea; Obesity, Class II, BMI 35-39.9; Depression; Chronic bilateral low back pain without sciatica; Chronic hypertension affecting pregnancy; Supervision of high risk pregnancy, antepartum; and Depression affecting pregnancy on their problem list.  Patient reports restless legs at night. Also has increase in heartburn. Tums helps. Contractions: Not present. Vag. Bleeding: None.  Movement: Present. Denies leaking of fluid.   The following portions of the patient's history were reviewed and updated as appropriate: allergies, current medications, past family history, past medical history, past social history, past surgical history and problem list.   Objective:   Vitals:   07/18/21 0814  BP: 123/72  Pulse: 84  Weight: 219 lb (99.3 kg)    Fetal Status: Fetal Heart Rate (bpm): 131 Fundal Height: 28 cm Movement: Present     General:  Alert, oriented and cooperative. Patient is in no acute distress.  Skin: Skin is warm and dry. No rash noted.   Cardiovascular: Normal heart rate noted  Respiratory: Normal respiratory effort, no problems with respiration noted  Abdomen: Soft, gravid, appropriate for gestational age.  Pain/Pressure: Absent     Pelvic: Cervical exam deferred        Extremities: Normal range of motion.  Edema: None  Mental Status: Normal mood and affect. Normal behavior. Normal judgment and thought content.   Assessment and Plan:  Pregnancy: G1P0000 at [redacted]w[redacted]d 1. Supervision of high risk pregnancy, antepartum - Routine OB care. GTT and labs today - Anticipatory guidance for upcoming appointments provided  - HIV  antibody (with reflex) - CBC - RPR - 2Hr GTT w/ 1 Hr Carpenter 75 g  2. Chronic hypertension affecting pregnancy - BP stable - On Inderal for HTN and migraines - bASA  3. [redacted] weeks gestation of pregnancy   4. History of migraine during pregnancy - Stable. On Inderal  5. Anxiety - Stable  6. Restless legs - Recommend adequate hydration, rest/relaxation techniques reviewed - May try 200-400mg  magnesium supplement daily   Preterm labor symptoms and general obstetric precautions including but not limited to vaginal bleeding, contractions, leaking of fluid and fetal movement were reviewed in detail with the patient. Please refer to After Visit Summary for other counseling recommendations.   Return in about 2 weeks (around 08/01/2021).  Future Appointments  Date Time Provider Department Center  07/28/2021  3:30 PM [facility] NURSE [facility]  07/28/2021  3:45 PM [facility] US4 [facility]  08/01/2021  8:50 AM [place]by, Wilmer Floor, CNM [facility]    Brand Males, CNM 07/18/21 10:09 AM

## 2021-07-18 NOTE — Progress Notes (Signed)
Pt will get Tdap at next visit Pt c/o heartburn Pt is having a difficult time sleeping

## 2021-07-20 LAB — 2HR GTT W 1 HR, CARPENTER, 75 G
Glucose, 1 Hr, Gest: 98 mg/dL (ref 65–179)
Glucose, 2 Hr, Gest: 83 mg/dL (ref 65–152)
Glucose, Fasting, Gest: 65 mg/dL (ref 65–91)

## 2021-07-20 LAB — CBC
HCT: 37.9 % (ref 35.0–45.0)
Hemoglobin: 12.8 g/dL (ref 11.7–15.5)
MCH: 27.2 pg (ref 27.0–33.0)
MCHC: 33.8 g/dL (ref 32.0–36.0)
MCV: 80.6 fL (ref 80.0–100.0)
MPV: 10 fL (ref 7.5–12.5)
Platelets: 228 10*3/uL (ref 140–400)
RBC: 4.7 10*6/uL (ref 3.80–5.10)
RDW: 13.8 % (ref 11.0–15.0)
WBC: 9.5 10*3/uL (ref 3.8–10.8)

## 2021-07-20 LAB — HIV ANTIBODY (ROUTINE TESTING W REFLEX): HIV 1&2 Ab, 4th Generation: NONREACTIVE

## 2021-07-20 LAB — RPR: RPR Ser Ql: NONREACTIVE

## 2021-07-28 ENCOUNTER — Other Ambulatory Visit: Payer: Self-pay | Admitting: *Deleted

## 2021-07-28 ENCOUNTER — Ambulatory Visit: Payer: Commercial Managed Care - PPO | Admitting: *Deleted

## 2021-07-28 ENCOUNTER — Other Ambulatory Visit: Payer: Self-pay

## 2021-07-28 ENCOUNTER — Ambulatory Visit: Payer: Commercial Managed Care - PPO | Attending: Maternal & Fetal Medicine

## 2021-07-28 VITALS — BP 121/69 | HR 79

## 2021-07-28 DIAGNOSIS — E669 Obesity, unspecified: Secondary | ICD-10-CM

## 2021-07-28 DIAGNOSIS — O10012 Pre-existing essential hypertension complicating pregnancy, second trimester: Secondary | ICD-10-CM | POA: Diagnosis not present

## 2021-07-28 DIAGNOSIS — O99213 Obesity complicating pregnancy, third trimester: Secondary | ICD-10-CM

## 2021-07-28 DIAGNOSIS — Z3A29 29 weeks gestation of pregnancy: Secondary | ICD-10-CM | POA: Diagnosis not present

## 2021-07-28 DIAGNOSIS — O10913 Unspecified pre-existing hypertension complicating pregnancy, third trimester: Secondary | ICD-10-CM

## 2021-07-28 DIAGNOSIS — O10013 Pre-existing essential hypertension complicating pregnancy, third trimester: Secondary | ICD-10-CM | POA: Diagnosis not present

## 2021-07-28 DIAGNOSIS — Z362 Encounter for other antenatal screening follow-up: Secondary | ICD-10-CM | POA: Insufficient documentation

## 2021-07-28 DIAGNOSIS — O10912 Unspecified pre-existing hypertension complicating pregnancy, second trimester: Secondary | ICD-10-CM

## 2021-07-28 DIAGNOSIS — Z6839 Body mass index (BMI) 39.0-39.9, adult: Secondary | ICD-10-CM

## 2021-07-30 NOTE — L&D Delivery Note (Signed)
OB/GYN Faculty Practice Delivery Note ? ?Sarah Gonzales is a 31 y.o. G1P1001 s/p VD at [redacted]w[redacted]d. She was admitted for IOL due to [facility].  ? ?ROM: 12h 29m with clear fluid ?GBS Status: Negative/-- (02/14 0000) ?Maximum Maternal Temperature: 98.38F ? ?Labor Progress: ?Initial SVE: 0.5/thick/ballotable. She then progressed to complete with assistance of cytotec, FB, pit, and AROM.  ? ?Delivery Date/Time: 3/3 at 42  ?Delivery: Called to room and patient was complete and pushing. Head delivered R OA. No nuchal cord present. Shoulder and body delivered in usual fashion. Infant with spontaneous cry, placed on mother's abdomen, dried and stimulated. Cord clamped x 2 after 1-minute delay, and cut by FOB. Cord blood drawn. Placenta delivered spontaneously with gentle cord traction. Fundus firm with massage, lower uterine sweep, and Pitocin. Labia, perineum, vagina, and cervix inspected with a small left and right vaginal canal laceration that were repaired with 3.0 vicryl in usual fashion.   ?Baby Weight: pending ? ?Placenta: 3 vessel, intact. Sent to L&D. ?Complications: None ?Lacerations: two small vaginal wall lacerations  ?EBL: 100 mL ?Analgesia: Epidural  ? ?Infant:  ?APGAR (1 MIN): 9   ?APGAR (5 MINS): 9   ? ?Leticia Penna, DO  ?[facility] Family Medicine Fellow, Faculty Practice ?Center for [employer], [facility] ?09/29/2021, 6:02 PM ? ? ? ?  ?

## 2021-08-01 ENCOUNTER — Other Ambulatory Visit: Payer: Self-pay

## 2021-08-01 ENCOUNTER — Ambulatory Visit (INDEPENDENT_AMBULATORY_CARE_PROVIDER_SITE_OTHER): Payer: Commercial Managed Care - PPO | Admitting: Advanced Practice Midwife

## 2021-08-01 VITALS — BP 120/78 | HR 82 | Wt 222.0 lb

## 2021-08-01 DIAGNOSIS — F419 Anxiety disorder, unspecified: Secondary | ICD-10-CM

## 2021-08-01 DIAGNOSIS — Z23 Encounter for immunization: Secondary | ICD-10-CM | POA: Diagnosis not present

## 2021-08-01 DIAGNOSIS — O10919 Unspecified pre-existing hypertension complicating pregnancy, unspecified trimester: Secondary | ICD-10-CM

## 2021-08-01 DIAGNOSIS — Z3A29 29 weeks gestation of pregnancy: Secondary | ICD-10-CM

## 2021-08-01 DIAGNOSIS — O0990 Supervision of high risk pregnancy, unspecified, unspecified trimester: Secondary | ICD-10-CM

## 2021-08-01 DIAGNOSIS — O0993 Supervision of high risk pregnancy, unspecified, third trimester: Secondary | ICD-10-CM

## 2021-08-01 DIAGNOSIS — O10913 Unspecified pre-existing hypertension complicating pregnancy, third trimester: Secondary | ICD-10-CM

## 2021-08-01 NOTE — Progress Notes (Signed)
° °  PRENATAL VISIT NOTE  Subjective:  Sarah Gonzales is a 31 y.o. G1P0000 at [redacted]w[redacted]d being seen today for ongoing prenatal care.  She is currently monitored for the following issues for this high-risk pregnancy and has Migraine headache; ALLERGIC RHINITIS CAUSE UNSPECIFIED; BURSITIS, RIGHT SHOULDER; HYPERHIDROSIS; IBS (irritable bowel syndrome); Hypertension; Mild sleep apnea; Obesity, Class II, BMI 35-39.9; Depression; Chronic bilateral low back pain without sciatica; Chronic hypertension affecting pregnancy; Supervision of high risk pregnancy, antepartum; and Depression affecting pregnancy on their problem list.  Patient reports no complaints.  Contractions: Not present. Vag. Bleeding: None.  Movement: Present. Denies leaking of fluid.   The following portions of the patient's history were reviewed and updated as appropriate: allergies, current medications, past family history, past medical history, past social history, past surgical history and problem list.   Objective:   Vitals:   08/01/21 0859  BP: 120/78  Pulse: 82  Weight: 222 lb (100.7 kg)    Fetal Status: Fetal Heart Rate (bpm): 134 Fundal Height: 30 cm Movement: Present     General:  Alert, oriented and cooperative. Patient is in no acute distress.  Skin: Skin is warm and dry. No rash noted.   Cardiovascular: Normal heart rate noted  Respiratory: Normal respiratory effort, no problems with respiration noted  Abdomen: Soft, gravid, appropriate for gestational age.  Pain/Pressure: Absent     Pelvic: Cervical exam deferred        Extremities: Normal range of motion.  Edema: None  Mental Status: Normal mood and affect. Normal behavior. Normal judgment and thought content.   Assessment and Plan:  Pregnancy: G1P0000 at [redacted]w[redacted]d 1. Supervision of high risk pregnancy, antepartum --Anticipatory guidance about next visits/weeks of pregnancy given. --Next appt in 2 weeks  2. Chronic hypertension affecting pregnancy --Stable on meds she  takes for migraine, no additional meds  3. Anxiety --Pt overall doing well but worried about anxiety during labor --Pt has childbirth class this week, should help to know what to expect --Discussed options for anxiety, including treatment now, and what can be given if she has panic attack in labor.  Assurance provided that some anxiety about labor and birth is normal and that RN and provider will be there to support her on day of labor. --Pt reassured, declines need for anxiety tx today    Preterm labor symptoms and general obstetric precautions including but not limited to vaginal bleeding, contractions, leaking of fluid and fetal movement were reviewed in detail with the patient. Please refer to After Visit Summary for other counseling recommendations.   No follow-ups on file.  Future Appointments  Date Time Provider [place]  08/15/2021  8:10 AM Rasch, [name], NP [facility]  08/18/2021  3:30 PM [facility] NURSE [facility]  08/18/2021  3:45 PM [facility] US7 [facility]  08/25/2021  3:30 PM [facility] NURSE [facility]  08/25/2021  3:45 PM [facility] US4 [facility]  09/01/2021  3:30 PM [facility] NURSE [facility]  09/01/2021  3:45 PM [facility] US4 [facility] [place]    Fatima Blank, CNM

## 2021-08-03 ENCOUNTER — Other Ambulatory Visit: Payer: Self-pay

## 2021-08-03 ENCOUNTER — Inpatient Hospital Stay (HOSPITAL_COMMUNITY)
Admission: AD | Admit: 2021-08-03 | Discharge: 2021-08-03 | Disposition: A | Discharge status: Home / Self Care | Payer: Commercial Managed Care - PPO | Attending: Obstetrics and Gynecology | Admitting: Obstetrics and Gynecology

## 2021-08-03 ENCOUNTER — Encounter (HOSPITAL_COMMUNITY): Payer: Self-pay | Admitting: Obstetrics and Gynecology

## 2021-08-03 ENCOUNTER — Encounter: Payer: Self-pay | Admitting: Advanced Practice Midwife

## 2021-08-03 DIAGNOSIS — W1831XA Fall on same level due to stepping on an object, initial encounter: Secondary | ICD-10-CM | POA: Diagnosis not present

## 2021-08-03 DIAGNOSIS — G43909 Migraine, unspecified, not intractable, without status migrainosus: Secondary | ICD-10-CM | POA: Insufficient documentation

## 2021-08-03 DIAGNOSIS — Z3A30 30 weeks gestation of pregnancy: Secondary | ICD-10-CM | POA: Insufficient documentation

## 2021-08-03 DIAGNOSIS — Y939 Activity, unspecified: Secondary | ICD-10-CM | POA: Insufficient documentation

## 2021-08-03 DIAGNOSIS — W19XXXA Unspecified fall, initial encounter: Secondary | ICD-10-CM

## 2021-08-03 DIAGNOSIS — O99353 Diseases of the nervous system complicating pregnancy, third trimester: Secondary | ICD-10-CM | POA: Insufficient documentation

## 2021-08-03 DIAGNOSIS — O10913 Unspecified pre-existing hypertension complicating pregnancy, third trimester: Secondary | ICD-10-CM | POA: Insufficient documentation

## 2021-08-03 DIAGNOSIS — Z79899 Other long term (current) drug therapy: Secondary | ICD-10-CM | POA: Diagnosis not present

## 2021-08-03 DIAGNOSIS — O9A213 Injury, poisoning and certain other consequences of external causes complicating pregnancy, third trimester: Secondary | ICD-10-CM | POA: Diagnosis not present

## 2021-08-03 DIAGNOSIS — R52 Pain, unspecified: Secondary | ICD-10-CM | POA: Diagnosis not present

## 2021-08-03 LAB — PROTEIN / CREATININE RATIO, URINE
Creatinine, Urine: 66.48 mg/dL
Protein Creatinine Ratio: 0.11 mg/mg{Cre} (ref 0.00–0.15)
Total Protein, Urine: 7 mg/dL

## 2021-08-03 LAB — COMPREHENSIVE METABOLIC PANEL
ALT: 18 U/L (ref 0–44)
AST: 18 U/L (ref 15–41)
Albumin: 3.1 g/dL — ABNORMAL LOW (ref 3.5–5.0)
Alkaline Phosphatase: 86 U/L (ref 38–126)
Anion gap: 6 (ref 5–15)
BUN: 8 mg/dL (ref 6–20)
CO2: 25 mmol/L (ref 22–32)
Calcium: 9 mg/dL (ref 8.9–10.3)
Chloride: 102 mmol/L (ref 98–111)
Creatinine, Ser: 0.49 mg/dL (ref 0.44–1.00)
GFR, Estimated: >60 mL/min (ref >60)
Glucose, Bld: 90 mg/dL (ref 70–99)
Potassium: 3.7 mmol/L (ref 3.5–5.1)
Sodium: 133 mmol/L — ABNORMAL LOW (ref 135–145)
Total Bilirubin: 0.6 mg/dL (ref 0.3–1.2)
Total Protein: 6.2 g/dL — ABNORMAL LOW (ref 6.5–8.1)

## 2021-08-03 LAB — CBC
HCT: 36.9 % (ref 36.0–46.0)
Hemoglobin: 12.8 g/dL (ref 12.0–15.0)
MCH: 27.9 pg (ref 26.0–34.0)
MCHC: 34.7 g/dL (ref 30.0–36.0)
MCV: 80.6 fL (ref 80.0–100.0)
Platelets: 204 10*3/uL (ref 150–400)
RBC: 4.58 MIL/uL (ref 3.87–5.11)
RDW: 13.5 % (ref 11.5–15.5)
WBC: 11 10*3/uL — ABNORMAL HIGH (ref 4.0–10.5)
nRBC: 0 % (ref 0.0–0.2)

## 2021-08-03 MED ORDER — ACETAMINOPHEN 500 MG PO TABS
1000.0000 mg | ORAL_TABLET | Freq: Once | ORAL | Status: AC
  Administered 2021-08-03: 1000 mg via ORAL
  Filled 2021-08-03: qty 2

## 2021-08-03 NOTE — MAU Note (Addendum)
.  Sarah Gonzales is a 31 y.o. at 108w0d here in MAU reporting: Slipped and fell around 1045 this morning. Denies direct abdominal trauma. Did land on right knee and elbow. Denies VB or LOF. Endorses good fetal movement. Has a slight headache after the fall, denies other PIH s/sx takes propranalol at home for [facility], took it this morning.   Pain score: 5 Vitals:   08/03/21 1241  BP: (!) 149/89  Pulse: 81  Resp: 18  Temp: 98.3 F (36.8 C)  SpO2: 99%     FHT:133

## 2021-08-03 NOTE — MAU Provider Note (Signed)
History     CSN: [id number]  Arrival date and time: 08/03/21 1221  None    Chief Complaint  Patient presents with   Fall   HPI Sarah Gonzales is a30 y.o. G1P0 at [redacted]w[redacted]d by LMP who presents to MAU for a fall. 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 She reports that she lost balance landed on her right knee, then fell backwards and hit right hip and back. This occurred around 11am. She did not sustain any trauma to abdomen or head. She reports that she just feels "achy and sore". She denies contractions, vaginal bleeding, or leaking fluid. Endorses active fetal movement. Pregnancy is complicated by cHTN and migraines which she takes Inderal for. She reports that since the fall she has developed a slight headache, 2/10. She has not taken anything for it, but denies vision changes or RUQ/epigastric pain.   OB History     Gravida  1   Para  0   Term  0   Preterm  0   AB  0   Living  0      SAB  0   IAB  0   Ectopic  0   Multiple  0   Live Births              Past Medical History:  Diagnosis Date   Anxiety    Bartholin cyst    Chronic headaches    Since the age of 18, severe in the past.   Hypertension    Warts     History reviewed. No pertinent surgical history.  Family History  Problem Relation Age of Onset   Hypertension Mother    Hypertension Father    Sleep apnea Father    Asthma Other        aunt   Bipolar disorder Maternal Aunt    Hypertension Maternal Grandfather    Lung cancer Maternal Grandfather    Hypertension Maternal Grandmother    Hypertension Paternal Grandfather    Hypertension Paternal Grandmother     Social History   Tobacco Use   Smoking status: Never   Smokeless tobacco: Never  Vaping Use   Vaping Use: Never used  Substance Use Topics   Alcohol use: Not Currently    Alcohol/week: 2.0 standard drinks    Types: 2 Standard drinks or equivalent per week    Comment: not while preg   Drug use: No     Allergies: No Known Allergies  No medications prior to admission.    Review of Systems  Constitutional: Negative.   Respiratory: Negative.    Cardiovascular: Negative.   Gastrointestinal: Negative.   Genitourinary: Negative.   Musculoskeletal:        Generalized achiness   Neurological:  Positive for headaches.   Physical Exam   Patient Vitals for the past 24 hrs:  BP Temp Temp src Pulse Resp SpO2 Height Weight  08/03/21 1638 131/65 -- -- 79 17 99 % -- --  08/03/21 1516 128/67 -- -- 70 -- -- -- --  08/03/21 1501 (!) 114/58 -- -- 72 20 -- -- --  08/03/21 1446 119/70 -- -- 74 -- -- -- --  08/03/21 1431 (!) 104/53 -- -- 76 -- -- -- --  08/03/21 1416 118/74 -- -- 69 -- -- -- --  08/03/21 1400 126/64 -- -- 72 -- -- -- --  08/03/21 1345 120/64 -- -- 74 -- -- -- --  08/03/21 1331 124/69 -- --  81 -- -- -- --  08/03/21 1316 (!) 150/93 -- -- 78 -- -- -- --  08/03/21 1300 140/78 -- -- 78 -- -- -- --  08/03/21 1241 (!) 149/89 98.3 F (36.8 C) Oral 81 18 99 % -- --  08/03/21 1231 -- -- -- -- -- -- 5\' 5"  (1.651 m) 101.2 kg   Physical Exam Vitals and nursing note reviewed.  Constitutional:      General: She is not in acute distress.    Appearance: She is obese.  Eyes:     Extraocular Movements: Extraocular movements intact.     Pupils: Pupils are equal, round, and reactive to light.  Cardiovascular:     Rate and Rhythm: Normal rate.  Pulmonary:     Effort: Pulmonary effort is normal.  Abdominal:     Palpations: Abdomen is soft.     Tenderness: There is no abdominal tenderness.     Comments: Gravid   Musculoskeletal:        General: No swelling, tenderness or deformity. Normal range of motion.     Cervical back: Normal range of motion.  Skin:    General: Skin is warm and dry.     Comments: Abrasion on right knee  Neurological:     General: No focal deficit present.     Mental Status: She is alert and oriented to person, place, and time.  Psychiatric:        Mood and  Affect: Mood normal.        Behavior: Behavior normal.        Thought Content: Thought content normal.        Judgment: Judgment normal.   NST FHR: 125bpm, moderate variability, +15x15 accels, no decels Toco: quiet  MAU Course  Procedures NST CBC, CMP, UPCR Tylenol 1000mg  PO   MDM Extended monitoring x4 hours given fall. NST reassuring for gestational age. Toco quiet. BP's initally mild range, however settled to normotensive once settled in room. Pre-e labs drawn given diagnosis of cHTN and mild headache. Tylenol 1000mg  PO also ordered for headache and generalized aches, which helped with pain relief. Labs wnl. Patient continues to deny pain, bleeding, or leaking fluid. Continues to endorse fetal movement. NST remains reassuring for gestational age.   Assessment and Plan  [redacted] weeks gestation of pregnancy Fall Chronic hypertension affecting pregnancy  - Discharge home in stable condition - Strict return precautions reviewed at length. Return to MAU as needed or for worsening symptoms - Keep OB appointment as scheduled    , CNM 08/03/2021, 5:39 PM

## 2021-08-08 ENCOUNTER — Encounter: Payer: Self-pay | Admitting: Advanced Practice Midwife

## 2021-08-15 ENCOUNTER — Ambulatory Visit (INDEPENDENT_AMBULATORY_CARE_PROVIDER_SITE_OTHER): Payer: Commercial Managed Care - PPO | Admitting: Obstetrics and Gynecology

## 2021-08-15 ENCOUNTER — Other Ambulatory Visit: Payer: Self-pay

## 2021-08-15 VITALS — BP 132/77 | HR 88 | Wt 225.0 lb

## 2021-08-15 DIAGNOSIS — O10919 Unspecified pre-existing hypertension complicating pregnancy, unspecified trimester: Secondary | ICD-10-CM

## 2021-08-15 DIAGNOSIS — O0990 Supervision of high risk pregnancy, unspecified, unspecified trimester: Secondary | ICD-10-CM

## 2021-08-15 NOTE — Progress Notes (Signed)
° °  PRENATAL VISIT NOTE  Subjective:  Sarah Gonzales is a 31 y.o. G1P0000 at [redacted]w[redacted]d being seen today for ongoing prenatal care.  She is currently monitored for the following issues for this high-risk pregnancy and has Migraine headache; ALLERGIC RHINITIS CAUSE UNSPECIFIED; BURSITIS, RIGHT SHOULDER; HYPERHIDROSIS; IBS (irritable bowel syndrome); Hypertension; Mild sleep apnea; Obesity, Class II, BMI 35-39.9; Depression; Chronic bilateral low back pain without sciatica; Chronic hypertension affecting pregnancy; Supervision of high risk pregnancy, antepartum; and Depression affecting pregnancy on their problem list.  Patient reports no complaints.  Contractions: Not present. Vag. Bleeding: None.  Movement: Present. Denies leaking of fluid.   The following portions of the patient's history were reviewed and updated as appropriate: allergies, current medications, past family history, past medical history, past social history, past surgical history and problem list.   Objective:   Vitals:   08/15/21 0812  BP: 132/77  Pulse: 88  Weight: 225 lb (102.1 kg)    Fetal Status: Fetal Heart Rate (bpm): 136 Fundal Height: 31 cm Movement: Present     General:  Alert, oriented and cooperative. Patient is in no acute distress.  Skin: Skin is warm and dry. No rash noted.   Cardiovascular: Normal heart rate noted  Respiratory: Normal respiratory effort, no problems with respiration noted  Abdomen: Soft, gravid, appropriate for gestational age.  Pain/Pressure: Absent     Pelvic: Cervical exam deferred        Extremities: Normal range of motion.  Edema: None  Mental Status: Normal mood and affect. Normal behavior. Normal judgment and thought content.   Assessment and Plan:  Pregnancy: G1P0000 at [redacted]w[redacted]d   1. Chronic hypertension affecting pregnancy  Continue BASA and indurol  BP good today Antenatal testing starts next week.   2. Supervision of high risk pregnancy, antepartum  Doing well   Preterm  labor symptoms and general obstetric precautions including but not limited to vaginal bleeding, contractions, leaking of fluid and fetal movement were reviewed in detail with the patient. Please refer to After Visit Summary for other counseling recommendations.   No follow-ups on file.  Future Appointments  Date Time Provider [place]  08/18/2021  3:30 PM [facility] NURSE [facility]  08/18/2021  3:45 PM [facility] US7 [facility]  08/25/2021  3:30 PM [facility] NURSE [facility]  08/25/2021  3:45 PM [facility] US4 [facility]  08/29/2021  3:10 PM [place] Lover[place], [name], NP [facility]  09/01/2021  3:30 PM [facility] NURSE [facility]  09/01/2021  3:45 PM [facility] US4 [facility] [place], NP

## 2021-08-18 ENCOUNTER — Ambulatory Visit: Payer: Commercial Managed Care - PPO | Attending: Obstetrics and Gynecology

## 2021-08-18 ENCOUNTER — Other Ambulatory Visit: Payer: Self-pay

## 2021-08-18 ENCOUNTER — Ambulatory Visit: Payer: Commercial Managed Care - PPO | Admitting: *Deleted

## 2021-08-18 VITALS — BP 118/61 | HR 79

## 2021-08-18 DIAGNOSIS — O99213 Obesity complicating pregnancy, third trimester: Secondary | ICD-10-CM

## 2021-08-18 DIAGNOSIS — O10913 Unspecified pre-existing hypertension complicating pregnancy, third trimester: Secondary | ICD-10-CM | POA: Insufficient documentation

## 2021-08-18 DIAGNOSIS — O10013 Pre-existing essential hypertension complicating pregnancy, third trimester: Secondary | ICD-10-CM | POA: Diagnosis not present

## 2021-08-18 DIAGNOSIS — Z3A32 32 weeks gestation of pregnancy: Secondary | ICD-10-CM | POA: Diagnosis not present

## 2021-08-25 ENCOUNTER — Ambulatory Visit: Payer: Commercial Managed Care - PPO | Attending: Obstetrics and Gynecology

## 2021-08-25 ENCOUNTER — Ambulatory Visit: Payer: Commercial Managed Care - PPO | Admitting: *Deleted

## 2021-08-25 ENCOUNTER — Other Ambulatory Visit: Payer: Self-pay

## 2021-08-25 ENCOUNTER — Other Ambulatory Visit: Payer: Self-pay | Admitting: *Deleted

## 2021-08-25 VITALS — BP 127/61 | HR 81

## 2021-08-25 DIAGNOSIS — O10913 Unspecified pre-existing hypertension complicating pregnancy, third trimester: Secondary | ICD-10-CM | POA: Diagnosis present

## 2021-08-25 DIAGNOSIS — O10013 Pre-existing essential hypertension complicating pregnancy, third trimester: Secondary | ICD-10-CM

## 2021-08-25 DIAGNOSIS — O99213 Obesity complicating pregnancy, third trimester: Secondary | ICD-10-CM | POA: Insufficient documentation

## 2021-08-25 DIAGNOSIS — Z3A33 33 weeks gestation of pregnancy: Secondary | ICD-10-CM | POA: Diagnosis not present

## 2021-08-29 ENCOUNTER — Ambulatory Visit (INDEPENDENT_AMBULATORY_CARE_PROVIDER_SITE_OTHER): Payer: Commercial Managed Care - PPO | Admitting: Obstetrics and Gynecology

## 2021-08-29 ENCOUNTER — Other Ambulatory Visit: Payer: Self-pay

## 2021-08-29 VITALS — BP 121/78 | HR 81 | Wt 225.0 lb

## 2021-08-29 DIAGNOSIS — O0990 Supervision of high risk pregnancy, unspecified, unspecified trimester: Secondary | ICD-10-CM

## 2021-08-29 NOTE — Progress Notes (Signed)
Pt c/o occasional cramping and small vaginal lesion

## 2021-08-29 NOTE — Progress Notes (Signed)
° °  PRENATAL VISIT NOTE  Subjective:  Aquetzalli Dub is a 31 y.o. G1P0000 at [redacted]w[redacted]d being seen today for ongoing prenatal care.  She is currently monitored for the following issues for this low-risk pregnancy and has Migraine headache; IBS (irritable bowel syndrome); Mild sleep apnea; Obesity, Class II, BMI 35-39.9; Chronic bilateral low back pain without sciatica; Chronic hypertension affecting pregnancy; Supervision of high risk pregnancy, antepartum; and Depression affecting pregnancy on their problem list.  Patient reports no complaints.  Contractions: Irritability. Vag. Bleeding: None.  Movement: Present. Denies leaking of fluid.   The following portions of the patient's history were reviewed and updated as appropriate: allergies, current medications, past family history, past medical history, past social history, past surgical history and problem list.   Objective:   Vitals:   08/29/21 1515  BP: 121/78  Pulse: 81  Weight: 225 lb (102.1 kg)    Fetal Status: Fetal Heart Rate (bpm): 138 Fundal Height: 34 cm Movement: Present     General:  Alert, oriented and cooperative. Patient is in no acute distress.  Skin: Skin is warm and dry. No rash noted.   Cardiovascular: Normal heart rate noted  Respiratory: Normal respiratory effort, no problems with respiration noted  Abdomen: Soft, gravid, appropriate for gestational age.  Pain/Pressure: Present     Pelvic: Cervical exam deferred   Extremities: Normal range of motion.  Edema: None  Mental Status: Normal mood and affect. Normal behavior. Normal judgment and thought content.   Assessment and Plan:  Pregnancy: G1P0000 at [redacted]w[redacted]d  1. Supervision of high risk pregnancy, antepartum  Birth plan scanned into chart Reviewed labor/ contractions etc Desires Midwife in labor/ natural birth.  Bp normal today Email sent to [facility] services to see if patient qualifies.  Chronic HTN- MFM visit scheduled 2/3 for BPP Continue BASA   Preterm  labor symptoms and general obstetric precautions including but not limited to vaginal bleeding, contractions, leaking of fluid and fetal movement were reviewed in detail with the patient. Please refer to After Visit Summary for other counseling recommendations.   Return in about 2 weeks (around 09/12/2021), or For low risk APP/MD.  Future Appointments  Date Time Provider [place]  09/01/2021  3:30 PM [facility] NURSE [facility]  09/01/2021  3:45 PM [facility] US4 [facility]  09/07/2021  3:30 PM [facility] NURSE [facility]  09/07/2021  3:45 PM [facility] US2 [facility]  09/12/2021  9:30 AM Trayce Maino, [name], NP [facility]  09/15/2021  2:15 PM [facility] NURSE [facility]  09/15/2021  2:30 PM [facility] US3 [facility] [place], NP

## 2021-09-01 ENCOUNTER — Other Ambulatory Visit: Payer: Self-pay

## 2021-09-01 ENCOUNTER — Ambulatory Visit: Payer: Commercial Managed Care - PPO | Admitting: *Deleted

## 2021-09-01 ENCOUNTER — Ambulatory Visit: Payer: Commercial Managed Care - PPO | Attending: Obstetrics and Gynecology

## 2021-09-01 VITALS — BP 123/64 | HR 83

## 2021-09-01 DIAGNOSIS — Z3A34 34 weeks gestation of pregnancy: Secondary | ICD-10-CM | POA: Diagnosis not present

## 2021-09-01 DIAGNOSIS — O10013 Pre-existing essential hypertension complicating pregnancy, third trimester: Secondary | ICD-10-CM

## 2021-09-01 DIAGNOSIS — O10913 Unspecified pre-existing hypertension complicating pregnancy, third trimester: Secondary | ICD-10-CM | POA: Insufficient documentation

## 2021-09-01 DIAGNOSIS — O99213 Obesity complicating pregnancy, third trimester: Secondary | ICD-10-CM | POA: Diagnosis present

## 2021-09-07 ENCOUNTER — Ambulatory Visit: Payer: Commercial Managed Care - PPO | Admitting: *Deleted

## 2021-09-07 ENCOUNTER — Ambulatory Visit: Payer: Commercial Managed Care - PPO | Attending: Obstetrics and Gynecology

## 2021-09-07 ENCOUNTER — Other Ambulatory Visit: Payer: Self-pay

## 2021-09-07 VITALS — BP 129/63 | HR 82

## 2021-09-07 DIAGNOSIS — E669 Obesity, unspecified: Secondary | ICD-10-CM | POA: Diagnosis not present

## 2021-09-07 DIAGNOSIS — O99213 Obesity complicating pregnancy, third trimester: Secondary | ICD-10-CM | POA: Diagnosis present

## 2021-09-07 DIAGNOSIS — Z3A35 35 weeks gestation of pregnancy: Secondary | ICD-10-CM | POA: Diagnosis not present

## 2021-09-07 DIAGNOSIS — O10913 Unspecified pre-existing hypertension complicating pregnancy, third trimester: Secondary | ICD-10-CM | POA: Diagnosis not present

## 2021-09-07 DIAGNOSIS — O10013 Pre-existing essential hypertension complicating pregnancy, third trimester: Secondary | ICD-10-CM | POA: Diagnosis not present

## 2021-09-08 ENCOUNTER — Other Ambulatory Visit: Payer: Self-pay | Admitting: *Deleted

## 2021-09-08 DIAGNOSIS — Z6839 Body mass index (BMI) 39.0-39.9, adult: Secondary | ICD-10-CM

## 2021-09-08 DIAGNOSIS — O10913 Unspecified pre-existing hypertension complicating pregnancy, third trimester: Secondary | ICD-10-CM

## 2021-09-12 ENCOUNTER — Other Ambulatory Visit: Payer: Self-pay

## 2021-09-12 ENCOUNTER — Ambulatory Visit (INDEPENDENT_AMBULATORY_CARE_PROVIDER_SITE_OTHER): Payer: Commercial Managed Care - PPO | Admitting: Obstetrics and Gynecology

## 2021-09-12 ENCOUNTER — Other Ambulatory Visit (HOSPITAL_COMMUNITY)
Admission: RE | Admit: 2021-09-12 | Discharge: 2021-09-12 | Disposition: A | Discharge status: Home / Self Care | Payer: Commercial Managed Care - PPO | Source: Ambulatory Visit | Attending: Obstetrics and Gynecology | Admitting: Obstetrics and Gynecology

## 2021-09-12 VITALS — BP 119/76 | HR 78 | Wt 227.0 lb

## 2021-09-12 DIAGNOSIS — O10919 Unspecified pre-existing hypertension complicating pregnancy, unspecified trimester: Secondary | ICD-10-CM

## 2021-09-12 DIAGNOSIS — O0990 Supervision of high risk pregnancy, unspecified, unspecified trimester: Secondary | ICD-10-CM | POA: Diagnosis present

## 2021-09-12 LAB — OB RESULTS CONSOLE GBS: GBS: NEGATIVE

## 2021-09-12 NOTE — Progress Notes (Signed)
° °  PRENATAL VISIT NOTE  Subjective:  Sarah Gonzales is a 31 y.o. G1P0000 at [redacted]w[redacted]d being seen today for ongoing prenatal care.  She is currently monitored for the following issues for this high-risk pregnancy and has Migraine headache; IBS (irritable bowel syndrome); Mild sleep apnea; Obesity, Class II, BMI 35-39.9; Chronic bilateral low back pain without sciatica; Chronic hypertension affecting pregnancy; Supervision of high risk pregnancy, antepartum; and Depression affecting pregnancy on their problem list.  Patient reports no complaints.  Contractions: Not present. Vag. Bleeding: None.  Movement: Present. Denies leaking of fluid.   The following portions of the patient's history were reviewed and updated as appropriate: allergies, current medications, past family history, past medical history, past social history, past surgical history and problem list.   Objective:   Vitals:   09/12/21 0930  BP: 119/76  Pulse: 78  Weight: 227 lb (103 kg)    Fetal Status: Fetal Heart Rate (bpm): 131 Fundal Height: 36 cm Movement: Present     General:  Alert, oriented and cooperative. Patient is in no acute distress.  Skin: Skin is warm and dry. No rash noted.   Cardiovascular: Normal heart rate noted  Respiratory: Normal respiratory effort, no problems with respiration noted  Abdomen: Soft, gravid, appropriate for gestational age.  Pain/Pressure: Absent     Pelvic: Cervical exam deferred        Extremities: Normal range of motion.  Edema: None  Mental Status: Normal mood and affect. Normal behavior. Normal judgment and thought content.   Assessment and Plan:  Pregnancy: G1P0000 at [redacted]w[redacted]d  1. Supervision of high risk pregnancy, antepartum  - Cervicovaginal ancillary only( [place]) - Culture, beta strep (group b only)  2. Chronic hypertension affecting pregnancy  On Inudurol for chronic migraines, however manages BP as well.  - Discussed foley bulb in the office @ 39 weeks for induction.  Will reach out to MFM for delivery recommendations as she does not fall in any of the category's for [facility]. BPs are controlled.  Continue BASA  Keep MFP appointments until delivery.  Start cervical ripening at home.    Preterm labor symptoms and general obstetric precautions including but not limited to vaginal bleeding, contractions, leaking of fluid and fetal movement were reviewed in detail with the patient. Please refer to After Visit Summary for other counseling recommendations.   Return in about 2 weeks (around 09/26/2021).  Future Appointments  Date Time Provider [place]  09/15/2021  2:15 PM [facility] NURSE [facility]  09/15/2021  2:30 PM [facility] US3 [facility]  09/18/2021  8:50 AM Donnamae Jude, MD [facility]  09/22/2021  3:30 PM [facility] NURSE [facility]  09/22/2021  3:45 PM [facility] US6 [facility]  09/29/2021  8:50 AM Jorden Minchey, [name], NP [facility]  09/29/2021  3:30 PM [facility] NURSE [facility]  09/29/2021  3:45 PM [facility] US6 [facility]  10/05/2021  8:30 AM Radene Gunning, MD [facility]  10/13/2021  8:50 AM Renee Harder, CNM [facility]    Noni Saupe, NP

## 2021-09-12 NOTE — Patient Instructions (Addendum)
LEG CRAMPS -- Leg cramps are common, usually occurring during the latter half of pregnancy. The cramps are due to painful muscle contractions and are generally experienced in the calves at night. They are thought to be secondary to a buildup of lactic and pyruvic acids leading to involuntary contraction of the affected muscles, but the exact etiology is unknown.  A Cochrane review found that the only placebo-controlled trial of calcium treatment showed no evidence of benefit in the treatment of leg cramps, but placebo controlled trials of magnesium supplementation suggested a possible benefit (odds ratio 0.18, 95 percent CI 0.05 to 0.60). This was a small trial of 100 pregnant women with persistent leg cramps. The preparation used was magnesium lactate or citrate 5 mmol in the morning and 10 mmol in the evening.    Stretching exercises may be an effective preventive measure. These can be performed in the weight-bearing position; they are held for 20 seconds and repeated three times in succession, four times daily for one week, then twice daily thereafter.  If a cramp occurs, calf stretches (toe raises), walking, or leg jiggling followed by leg elevation may be helpful. Other nonpharmacologic remedies include: ?A hot shower or warm tub bath ?Ice massage ?Regular exercise for conditioning, calf strengthening and stretching ?Increased hydration ?Use of long-countered shoes and other proper foot gear.       Cervical Ripening (to get your cervix ready for labor) : May try one or all:  Red Raspberry Leaf capsules:  two 300mg  or 400mg  tablets with each meal, 2-3 times a day  Potential Side Effects Of Raspberry Leaf:  Most women do not experience any side effects from drinking raspberry leaf tea. However, nausea and loose stools are possible    Evening Primrose Oil capsules: may take 1 to 3 capsules daily. May also prick one to release the oil and insert it into your vagina at night.  Some of the  potential side effects:  Upset stomach  Loose stools or diarrhea  Headaches  Nausea   6 Dates a day (may taste better if warmed in microwave until soft). Found where raisins are in the grocery store     OUTPATIENT FOLEY BULB INDUCTION OF LABOR:  Information Sheet for Mothers and Family               Whats a Foley Bulb Induction? A Foley bulb induction is a procedure where your provider inserts a catheter into your cervix. Once inside your womb, your provider inflates the balloon with a saline solution.   This puts pressure on your cervix and encourages dilation. The catheter falls out once your cervix dilates to 3-4 centimeters.     With any procedure, its important that you know what to expect. The insertion of a Foley catheter can be a bit uncomfortable, and some women experience sharp pelvic pain. The pain may subside once the catheter is in place. You may experience some cramping when the Foley catheter is in place.  This is normal.     GO TO THE MATERNITY ADMISSIONS UNIT FOR THE FOLLOWING: Heavy vaginal bleeding Rupture of membranes (fluid that wets your underwear) Painful uterine contractions every 5 minutes or less Severe abdominal discomfort Decreased movement of the baby

## 2021-09-12 NOTE — Progress Notes (Signed)
36 week labs done

## 2021-09-13 LAB — CERVICOVAGINAL ANCILLARY ONLY
Chlamydia: NEGATIVE
Comment: NEGATIVE
Comment: NORMAL
Neisseria Gonorrhea: NEGATIVE

## 2021-09-15 ENCOUNTER — Ambulatory Visit: Payer: Commercial Managed Care - PPO | Attending: Obstetrics and Gynecology

## 2021-09-15 ENCOUNTER — Ambulatory Visit: Payer: Commercial Managed Care - PPO | Admitting: *Deleted

## 2021-09-15 ENCOUNTER — Other Ambulatory Visit: Payer: Self-pay

## 2021-09-15 VITALS — BP 126/78 | HR 70

## 2021-09-15 DIAGNOSIS — O10913 Unspecified pre-existing hypertension complicating pregnancy, third trimester: Secondary | ICD-10-CM | POA: Insufficient documentation

## 2021-09-15 DIAGNOSIS — O10013 Pre-existing essential hypertension complicating pregnancy, third trimester: Secondary | ICD-10-CM | POA: Diagnosis not present

## 2021-09-15 DIAGNOSIS — Z3A36 36 weeks gestation of pregnancy: Secondary | ICD-10-CM | POA: Diagnosis not present

## 2021-09-15 LAB — CULTURE, BETA STREP (GROUP B ONLY)
MICRO NUMBER:: 13006677
SPECIMEN QUALITY:: ADEQUATE

## 2021-09-18 ENCOUNTER — Ambulatory Visit (INDEPENDENT_AMBULATORY_CARE_PROVIDER_SITE_OTHER): Payer: Commercial Managed Care - PPO | Admitting: Family Medicine

## 2021-09-18 ENCOUNTER — Other Ambulatory Visit: Payer: Self-pay

## 2021-09-18 VITALS — BP 134/79 | HR 78 | Wt 228.0 lb

## 2021-09-18 DIAGNOSIS — O0990 Supervision of high risk pregnancy, unspecified, unspecified trimester: Secondary | ICD-10-CM

## 2021-09-18 DIAGNOSIS — O10919 Unspecified pre-existing hypertension complicating pregnancy, unspecified trimester: Secondary | ICD-10-CM

## 2021-09-18 NOTE — Progress Notes (Signed)
ROB 36.4 wks GBS and GC/CC last visit, negative Occ mild headaches at night

## 2021-09-18 NOTE — Progress Notes (Signed)
° °  PRENATAL VISIT NOTE  Subjective:  Sarah Gonzales is a 31 y.o. G1P0000 at [redacted]w[redacted]d being seen today for ongoing prenatal care.  She is currently monitored for the following issues for this high-risk pregnancy and has Migraine headache; Mild sleep apnea; Obesity, Class II, BMI 35-39.9; Chronic hypertension affecting pregnancy; Supervision of high risk pregnancy, antepartum; and Depression affecting pregnancy on their problem list.  Patient reports headache nightly.  Contractions: Not present. Vag. Bleeding: None.  Movement: Present. Denies leaking of fluid.   The following portions of the patient's history were reviewed and updated as appropriate: allergies, current medications, past family history, past medical history, past social history, past surgical history and problem list.   Objective:   Vitals:   09/18/21 0849  BP: 134/79  Pulse: 78  Weight: 228 lb (103.4 kg)    Fetal Status: Fetal Heart Rate (bpm): 142 Fundal Height: 36 cm Movement: Present  Presentation: Vertex  General:  Alert, oriented and cooperative. Patient is in no acute distress.  Skin: Skin is warm and dry. No rash noted.   Cardiovascular: Normal heart rate noted  Respiratory: Normal respiratory effort, no problems with respiration noted  Abdomen: Soft, gravid, appropriate for gestational age.  Pain/Pressure: Absent     Pelvic: Cervical exam deferred        Extremities: Normal range of motion.  Edema: Trace  Mental Status: Normal mood and affect. Normal behavior. Normal judgment and thought content.   Assessment and Plan:  Pregnancy: G1P0000 at 100w4d 1. Chronic hypertension affecting pregnancy Has Headaches in evening relieved with tylenol--please check BP when having headaches. Risks reviewed. On propranolol for h/a. On ASA. Recent growth WNL In antenatal testing. Delivery by 40 wks.  2. Supervision of high risk pregnancy, antepartum GBS negative.  Preterm labor symptoms and general obstetric precautions  including but not limited to vaginal bleeding, contractions, leaking of fluid and fetal movement were reviewed in detail with the patient. Please refer to After Visit Summary for other counseling recommendations.   Return in 1 week (on 09/25/2021).  Future Appointments  Date Time Provider [place]  09/22/2021  3:30 PM [facility] NURSE [facility]  09/22/2021  3:45 PM [facility] US6 [facility]  09/29/2021  8:50 AM Rasch, [name], NP [facility]  09/29/2021  3:30 PM [facility] NURSE [facility]  09/29/2021  3:45 PM [facility] US6 [facility]  10/05/2021  8:30 AM Radene Gunning, MD [facility]  10/13/2021  8:50 AM Renee Harder, CNM [facility]    Donnamae Jude, MD

## 2021-09-22 ENCOUNTER — Ambulatory Visit: Payer: Commercial Managed Care - PPO | Admitting: *Deleted

## 2021-09-22 ENCOUNTER — Ambulatory Visit: Payer: Commercial Managed Care - PPO | Attending: Obstetrics and Gynecology

## 2021-09-22 ENCOUNTER — Other Ambulatory Visit: Payer: Self-pay

## 2021-09-22 VITALS — BP 127/66 | HR 77

## 2021-09-22 DIAGNOSIS — Z3A37 37 weeks gestation of pregnancy: Secondary | ICD-10-CM

## 2021-09-22 DIAGNOSIS — Z6839 Body mass index (BMI) 39.0-39.9, adult: Secondary | ICD-10-CM | POA: Diagnosis present

## 2021-09-22 DIAGNOSIS — Z362 Encounter for other antenatal screening follow-up: Secondary | ICD-10-CM

## 2021-09-22 DIAGNOSIS — O10913 Unspecified pre-existing hypertension complicating pregnancy, third trimester: Secondary | ICD-10-CM | POA: Diagnosis present

## 2021-09-22 DIAGNOSIS — O10013 Pre-existing essential hypertension complicating pregnancy, third trimester: Secondary | ICD-10-CM

## 2021-09-27 ENCOUNTER — Inpatient Hospital Stay (HOSPITAL_COMMUNITY)
Admission: AD | Admit: 2021-09-27 | Discharge: 2021-10-01 | DRG: 807 | Disposition: A | Discharge status: Home / Self Care | Payer: Commercial Managed Care - PPO | Attending: Obstetrics and Gynecology | Admitting: Obstetrics and Gynecology

## 2021-09-27 ENCOUNTER — Other Ambulatory Visit: Payer: Self-pay

## 2021-09-27 ENCOUNTER — Encounter (HOSPITAL_COMMUNITY): Payer: Self-pay | Admitting: Obstetrics & Gynecology

## 2021-09-27 DIAGNOSIS — R030 Elevated blood-pressure reading, without diagnosis of hypertension: Secondary | ICD-10-CM | POA: Diagnosis present

## 2021-09-27 DIAGNOSIS — O1002 Pre-existing essential hypertension complicating childbirth: Secondary | ICD-10-CM | POA: Diagnosis present

## 2021-09-27 DIAGNOSIS — O99892 Other specified diseases and conditions complicating childbirth: Secondary | ICD-10-CM | POA: Diagnosis present

## 2021-09-27 DIAGNOSIS — O10919 Unspecified pre-existing hypertension complicating pregnancy, unspecified trimester: Secondary | ICD-10-CM

## 2021-09-27 DIAGNOSIS — Z3A37 37 weeks gestation of pregnancy: Secondary | ICD-10-CM | POA: Diagnosis not present

## 2021-09-27 DIAGNOSIS — R0981 Nasal congestion: Secondary | ICD-10-CM | POA: Diagnosis present

## 2021-09-27 DIAGNOSIS — O99344 Other mental disorders complicating childbirth: Secondary | ICD-10-CM | POA: Diagnosis present

## 2021-09-27 DIAGNOSIS — G43909 Migraine, unspecified, not intractable, without status migrainosus: Secondary | ICD-10-CM | POA: Diagnosis present

## 2021-09-27 DIAGNOSIS — Z7982 Long term (current) use of aspirin: Secondary | ICD-10-CM | POA: Diagnosis not present

## 2021-09-27 DIAGNOSIS — I10 Essential (primary) hypertension: Secondary | ICD-10-CM | POA: Diagnosis present

## 2021-09-27 DIAGNOSIS — F32A Depression, unspecified: Secondary | ICD-10-CM | POA: Diagnosis present

## 2021-09-27 DIAGNOSIS — F339 Major depressive disorder, recurrent, unspecified: Secondary | ICD-10-CM | POA: Diagnosis present

## 2021-09-27 DIAGNOSIS — O99214 Obesity complicating childbirth: Secondary | ICD-10-CM | POA: Diagnosis present

## 2021-09-27 DIAGNOSIS — Z20822 Contact with and (suspected) exposure to covid-19: Secondary | ICD-10-CM | POA: Diagnosis present

## 2021-09-27 DIAGNOSIS — O10913 Unspecified pre-existing hypertension complicating pregnancy, third trimester: Secondary | ICD-10-CM

## 2021-09-27 DIAGNOSIS — Z3A38 38 weeks gestation of pregnancy: Secondary | ICD-10-CM | POA: Diagnosis not present

## 2021-09-27 DIAGNOSIS — O99340 Other mental disorders complicating pregnancy, unspecified trimester: Secondary | ICD-10-CM | POA: Diagnosis present

## 2021-09-27 LAB — CBC
HCT: 38 % (ref 36.0–46.0)
Hemoglobin: 12.6 g/dL (ref 12.0–15.0)
MCH: 26.9 pg (ref 26.0–34.0)
MCHC: 33.2 g/dL (ref 30.0–36.0)
MCV: 81 fL (ref 80.0–100.0)
Platelets: 196 10*3/uL (ref 150–400)
RBC: 4.69 MIL/uL (ref 3.87–5.11)
RDW: 14 % (ref 11.5–15.5)
WBC: 11.7 10*3/uL — ABNORMAL HIGH (ref 4.0–10.5)
nRBC: 0 % (ref 0.0–0.2)

## 2021-09-27 LAB — COMPREHENSIVE METABOLIC PANEL
ALT: 10 U/L (ref 0–44)
AST: 43 U/L — ABNORMAL HIGH (ref 15–41)
Albumin: 3 g/dL — ABNORMAL LOW (ref 3.5–5.0)
Alkaline Phosphatase: 109 U/L (ref 38–126)
Anion gap: 11 (ref 5–15)
BUN: 8 mg/dL (ref 6–20)
CO2: 21 mmol/L — ABNORMAL LOW (ref 22–32)
Calcium: 9.4 mg/dL (ref 8.9–10.3)
Chloride: 102 mmol/L (ref 98–111)
Creatinine, Ser: 0.6 mg/dL (ref 0.44–1.00)
GFR, Estimated: >60 mL/min (ref >60)
Glucose, Bld: 110 mg/dL — ABNORMAL HIGH (ref 70–99)
Potassium: 4.7 mmol/L (ref 3.5–5.1)
Sodium: 134 mmol/L — ABNORMAL LOW (ref 135–145)
Total Bilirubin: 1.2 mg/dL (ref 0.3–1.2)
Total Protein: 5.9 g/dL — ABNORMAL LOW (ref 6.5–8.1)

## 2021-09-27 LAB — PROTEIN / CREATININE RATIO, URINE
Creatinine, Urine: 62.27 mg/dL
Protein Creatinine Ratio: 0.13 mg/mg{Cre} (ref 0.00–0.15)
Total Protein, Urine: 8 mg/dL

## 2021-09-27 MED ORDER — OXYTOCIN-SODIUM CHLORIDE 30-0.9 UT/500ML-% IV SOLN
2.5000 [IU]/h | INTRAVENOUS | Status: DC
  Filled 2021-09-27: qty 500

## 2021-09-27 MED ORDER — FLEET ENEMA 7-19 GM/118ML RE ENEM: 1.0000 | ENEMA | RECTAL | Status: DC | PRN

## 2021-09-27 MED ORDER — LACTATED RINGERS IV SOLN: 500.0000 mL | INTRAVENOUS | Status: DC | PRN

## 2021-09-27 MED ORDER — OXYCODONE-ACETAMINOPHEN 5-325 MG PO TABS: 2.0000 | ORAL_TABLET | ORAL | Status: DC | PRN

## 2021-09-27 MED ORDER — ACETAMINOPHEN 325 MG PO TABS
650.0000 mg | ORAL_TABLET | ORAL | Status: DC | PRN
  Administered 2021-09-28: 650 mg via ORAL
  Filled 2021-09-27: qty 2

## 2021-09-27 MED ORDER — OXYTOCIN BOLUS FROM INFUSION
333.0000 mL | Freq: Once | INTRAVENOUS | Status: AC
  Administered 2021-09-29: 333 mL via INTRAVENOUS

## 2021-09-27 MED ORDER — LACTATED RINGERS IV SOLN: INTRAVENOUS | Status: DC

## 2021-09-27 MED ORDER — OXYCODONE-ACETAMINOPHEN 5-325 MG PO TABS: 1.0000 | ORAL_TABLET | ORAL | Status: DC | PRN

## 2021-09-27 MED ORDER — ONDANSETRON HCL 4 MG/2ML IJ SOLN: 4.0000 mg | Freq: Four times a day (QID) | INTRAMUSCULAR | Status: DC | PRN

## 2021-09-27 MED ORDER — SOD CITRATE-CITRIC ACID 500-334 MG/5ML PO SOLN: 30.0000 mL | ORAL | Status: DC | PRN

## 2021-09-27 MED ORDER — LIDOCAINE HCL (PF) 1 % IJ SOLN
30.0000 mL | INTRAMUSCULAR | Status: DC | PRN
Start: 2021-09-27 — End: 2021-09-29

## 2021-09-27 NOTE — MAU Note (Signed)
Sarah Gonzales is a 31 y.o. at [redacted]w[redacted]d here in MAU reporting: feeling woozy and light headed on the way home at 1750.. blood pressure at home was 142/92 states she was on the phone with the doctor and checked BP again, 155/99 was told to come here. "High risk for potential preeclampsia". Takes baby aspirin. Has been on indurol for migraines but has been managing her blood pressure throughout the pregnancy with it as well. Not previous complications with blood pressure prior to this evening. Denies any HA, visual disturbances, or epigastric pain. Denies VB or LOF. +FM.  ? ?Onset of complaint: 1750 ?Pain score: denies pain ?  ?FHT:145 ? ? ?

## 2021-09-27 NOTE — MAU Provider Note (Signed)
Chief Complaint  ?Patient presents with  ? Hypertension  ? ? ? Event Date/Time  ? First Provider Initiated Contact with Patient 09/27/21 2104   ?  ? ?S: Sarah Gonzales  is a 31 y.o. y.o. year old G60P0000 female at [redacted]w[redacted]d weeks gestation who presents to MAU with elevated blood pressures.  Hx of hypertension. Current blood pressure medication: propranolol. ? ?Associated symptoms: Denies Headache, denies vision changes, denies epigastric pain ?Contractions: denies ?Vaginal bleeding: denies ?Fetal movement: normal ? ?O: Patient Vitals for the past 24 hrs: ? BP Temp Temp src Pulse Resp SpO2 Height Weight  ?09/27/21 2330 (!) 155/91 -- -- 76 18 -- -- --  ?09/27/21 2255 (!) 141/70 -- -- 76 -- 99 % -- --  ?09/27/21 2246 (!) 152/80 -- -- 82 -- -- -- --  ?09/27/21 2231 (!) 145/88 -- -- 77 -- -- -- --  ?09/27/21 2201 136/81 -- -- 79 -- -- -- --  ?09/27/21 2146 (!) 145/83 -- -- 78 -- -- -- --  ?09/27/21 2130 (!) 138/94 -- -- 85 -- 99 % -- --  ?09/27/21 2116 (!) 149/91 -- -- 81 -- 100 % -- --  ?09/27/21 2109 (!) 151/79 -- -- 84 -- -- -- --  ?09/27/21 2040 (!) 148/86 -- -- 84 16 99 % -- --  ?09/27/21 2023 (!) 142/91 98.1 ?F (36.7 ?C) Oral 77 20 99 % 5\' 6"  (1.676 m) 104.8 kg  ? ?General: NAD ?Heart: Regular rate ?Lungs: Normal rate and effort ?Abd: Soft, NT, Gravid, S=D ?Extremities: non pitting Pedal edema ?Neuro: 2+ deep tendon reflexes, No clonus ? ?Fetal Tracing: ? ?Baseline: 125 ?Variability: moderate  ?Accelerations: 15x15 ?Decelerations: none ? ?Toco: irregular ? ? ?Results for orders placed or performed during the hospital encounter of 09/27/21 (from the past 24 hour(s))  ?Protein / creatinine ratio, urine     Status: None  ? Collection Time: 09/27/21  9:24 PM  ?Result Value Ref Range  ? Creatinine, Urine 62.27 mg/dL  ? Total Protein, Urine 8 mg/dL  ? Protein Creatinine Ratio 0.13 0.00 - 0.15 mg/mg[Cre]  ?CBC     Status: Abnormal  ? Collection Time: 09/27/21 10:02 PM  ?Result Value Ref Range  ? WBC 11.7 (H) 4.0 - 10.5 K/uL  ?  RBC 4.69 3.87 - 5.11 MIL/uL  ? Hemoglobin 12.6 12.0 - 15.0 g/dL  ? HCT 38.0 36.0 - 46.0 %  ? MCV 81.0 80.0 - 100.0 fL  ? MCH 26.9 26.0 - 34.0 pg  ? MCHC 33.2 30.0 - 36.0 g/dL  ? RDW 14.0 11.5 - 15.5 %  ? Platelets 196 150 - 400 K/uL  ? nRBC 0.0 0.0 - 0.2 %  ?Comprehensive metabolic panel     Status: Abnormal  ? Collection Time: 09/27/21 10:02 PM  ?Result Value Ref Range  ? Sodium 134 (L) 135 - 145 mmol/L  ? Potassium 4.7 3.5 - 5.1 mmol/L  ? Chloride 102 98 - 111 mmol/L  ? CO2 21 (L) 22 - 32 mmol/L  ? Glucose, Bld 110 (H) 70 - 99 mg/dL  ? BUN 8 6 - 20 mg/dL  ? Creatinine, Ser 0.60 0.44 - 1.00 mg/dL  ? Calcium 9.4 8.9 - 10.3 mg/dL  ? Total Protein 5.9 (L) 6.5 - 8.1 g/dL  ? Albumin 3.0 (L) 3.5 - 5.0 g/dL  ? AST 43 (H) 15 - 41 U/L  ? ALT 10 0 - 44 U/L  ? Alkaline Phosphatase 109 38 - 126 U/L  ? Total Bilirubin 1.2  0.3 - 1.2 mg/dL  ? GFR, Estimated >60 >60 mL/min  ? Anion gap 11 5 - 15  ? ? ?MDM ?Patient with persistently elevated BPs on antihypertensives. No severe range BPs or features. Reviewed with Dr. Debroah Loop, will admit for induction.  ? ?Pt informed that the ultrasound is considered a limited OB ultrasound and is not intended to be a complete ultrasound exam.  Patient also informed that the ultrasound is not being completed with the intent of assessing for fetal or placental anomalies or any pelvic abnormalities.  Explained that the purpose of today?s ultrasound is to assess for  presentation.  Patient acknowledges the purpose of the exam and the limitations of the study.   ?cephalic ? ? ?A:  ?1. Chronic hypertension in obstetric context in third trimester   ?2. [redacted] weeks gestation of pregnancy   ? ? ?P:  ?Admit to birthing suites ?Care turned over to labor team ? ?Judeth Horn, NP ?09/27/2021 ?11:33 PM ? ?

## 2021-09-27 NOTE — H&P (Addendum)
OB ADMISSION/ HISTORY & PHYSICAL: ? ?Admission Date: 09/27/2021  8:00 PM  ?Admit Diagnosis: HBP, 153 over 103, SOB, Nauseated, Light-headed   ? ?Sarah Gonzales is a 31 y.o. female at [redacted]w[redacted]d presented to MAU with elevated blood pressures. Hx of hypertension. Current blood pressure medication: propranolol. Patient reports HA, but denies vision changes, and RUQ pain. Patient reports good fetal movement and denies LOF, and vaginal bleeding. Patient expecting abby girl Colon Branch and spouse, Chrissie Noa at bedside and supportive.  ? ?Prenatal History: ?G1P0000   ?EDC : 10/12/2021, by Last Menstrual Period  ? ? ?Prenatal course complicated by: ?-Chronic HTN- On propranolol for h/a. On BASA. ? ?Prenatal Labs: ?     ?Nursing Staff Provider  ?Office Location  [place] Dating  LMP c/w 9 week [name]  ?Language   English Anatomy US  Normal, incomplete - F/U sched 4 weeks  ?Flu Vaccine   05/23/21 Genetic/Carrier Screen  NIPS:  Low Risk ?AFP:    ?Horizon:Neg  ?TDaP Vaccine   08/01/21 Hgb A1C or  ?GTT Early  A1C 4.9 ?Third trimester   ?COVID Vaccine Pzier x 2   LAB RESULTS   ?Rhogam   N/A Blood Type O/RH(D) POSITIVE/-- (08/16 1413)   ?Baby Feeding Plan  Breast Antibody NO ANTIBODIES DETECTED (08/16 1413)  ?Contraception  Unsure Rubella 6.16 (08/16 1413)  ?Circumcision  Yes, IP RPR NON-REACTIVE (08/16 1413)   ?Pediatrician   List given HBsAg NON-REACTIVE (08/16 1413)   ?Support Person  Chrissie Noa Spouse HCVAb Neg  ?Prenatal Classes  Info given HIV NON-REACTIVE (08/16 1413)     ?BTL Consent  N/A GBS   ?(For PCN allergy, check sensitivities)   ?VBAC Consent  N/A Pap wnl 05/18/20  ?         ?DME Rx [ ]  BP cuff ?[ ]  Weight Scale Waterbirth  [ ]  Class [ ]  Consent [ ]  CNM visit  ?PHQ9 & GAD7 [  ] new OB ?[  ] 28 weeks  ?[  ] 36 weeks Induction  [ ]  Orders Entered [ ] Foley Y/N  ? ? ?  ?Maternal Diabetes: No ?Genetic Screening: Normal ?Maternal Ultrasounds/Referrals: Normal ?Fetal Ultrasounds or other Referrals:  None ?Maternal Substance Abuse:   No ?Significant Maternal Medications:  None ?Significant Maternal Lab Results:  Group B Strep negative ?Other Comments:  None ? ?Medical / Surgical History : ?Past medical history:  ?Past Medical History:  ?Diagnosis Date  ? Anxiety   ? Bartholin cyst   ? Chronic headaches   ? Since the age of 15, severe in the past.  ? Hypertension   ? Warts   ?  ?Past surgical history: History reviewed. No pertinent surgical history.  ?Family History:  ?Family History  ?Problem Relation Age of Onset  ? Hypertension Mother   ? Hypertension Father   ? Sleep apnea Father   ? Asthma Other   ?     aunt  ? Bipolar disorder Maternal Aunt   ? Hypertension Maternal Grandfather   ? Lung cancer Maternal Grandfather   ? Hypertension Maternal Grandmother   ? Hypertension Paternal Grandfather   ? Hypertension Paternal Grandmother   ?  ?Social History:  reports that she has never smoked. She has never used smokeless tobacco. She reports that she does not currently use alcohol after a past usage of about 2.0 standard drinks per week. She reports that she does not use drugs. ?Allergies: ?Patient has no known allergies.  ? ?Current Medications at time of admission:  ?Medications Prior  to Admission  ?Medication Sig Dispense Refill Last Dose  ? aspirin EC 81 MG tablet Take 81 mg by mouth daily. Swallow whole.     ? FLUoxetine (PROZAC) 20 MG capsule Take 2 capsules by mouth once daily 60 capsule 5   ? Prenatal Vit-Fe Fumarate-FA (MULTIVITAMIN-PRENATAL) 27-0.8 MG TABS tablet Take 1 tablet by mouth daily at 12 noon.     ? propranolol ER (INDERAL LA) 60 MG 24 hr capsule Take 1 capsule by mouth once daily 90 capsule 0   ?  ? ?Review of Systems: ?ROS ? ?Physical Exam: ?Vital signs and nursing notes reviewed. ? ?Patient Vitals for the past 24 hrs: ? BP Temp Temp src Pulse Resp SpO2 Height Weight  ?09/27/21 2330 (!) 155/91 -- -- 76 18 -- -- --  ?09/27/21 2255 (!) 141/70 -- -- 76 -- 99 % -- --  ?09/27/21 2246 (!) 152/80 -- -- 82 -- -- -- --  ?09/27/21 2231  (!) 145/88 -- -- 77 -- -- -- --  ?09/27/21 2201 136/81 -- -- 79 -- -- -- --  ?09/27/21 2146 (!) 145/83 -- -- 78 -- -- -- --  ?09/27/21 2130 (!) 138/94 -- -- 85 -- 99 % -- --  ?09/27/21 2116 (!) 149/91 -- -- 81 -- 100 % -- --  ?09/27/21 2109 (!) 151/79 -- -- 84 -- -- -- --  ?09/27/21 2040 (!) 148/86 -- -- 84 16 99 % -- --  ?09/27/21 2023 (!) 142/91 98.1 ?F (36.7 ?C) Oral 77 20 99 % 5\' 6"  (1.676 m) 104.8 kg  ?  ? ?General: AAO x 3, NAD ?Heart: RRR ?Lungs:CTAB ?Abdomen: Gravid, NT ?Extremities: no edema ?Genitalia / VE:    ? ?FHR: 125 BPM, moderate variability, present accels, no decels ?TOCO: Contractions  ? ?Labs:   ?Pending T&S, CBC, RPR  ?Recent Labs  ?  09/27/21 ?2202  ?WBC 11.7*  ?HGB 12.6  ?HCT 38.0  ?PLT 196  ? ? ?Assessment: ? 31 y.o. G1P0000 at [redacted]w[redacted]d IOL due to worsening cHTN (not severely elevated) on Propranolol.  ? ?1. 1 stage of labor ?2. FHR category 1 ?3. GBS negative ?4. cHTN- On Propranolol daily. P/C ratio 0.13 and other Pre-e labs normal (AST very slight [redacted]w[redacted]d). Will continue to monitor.  ?4. Desires unmedicated labor  ?5. Plans to breastfeed ?6. Patient has a headache- Tylenol and will discuss Fioricet if not relieved by Tylenol.  ? ?Plan:  ?1. Admit to BS ?2. Routine L&D orders ?3. Analgesia/anesthesia PRN  ?4. Cytotec- cervical ripening. Discussed foley balloon at next cervical exam and patient agrees with the plan.  ?5. Anticipate NSVB ?6. Planning IUD postpartum  ? ? ?Fitz.Calico White,SNM ?09/27/2021, 12:40 AM  ? ?CNM attestation: ? ?I have seen and examined this patient; I agree with above documentation in the student nurse midwife's note.  ? ?Sarah Gonzales is a 31 y.o. G1P0000 here for IOL due to worsening cHTN- not likely to be super-imposed pre-e, but we will treat her H/A to rule that in/out ? ?PE: ?BP 124/76   Pulse 79   Temp (!) 97.4 ?F (36.3 ?C) (Oral)   Resp 18   Ht 5\' 6"  (1.676 m)   Wt 104.8 kg   LMP 01/05/2021   SpO2 99%   BMI 37.28 kg/m?  ?Gen: calm comfortable, NAD ?Resp: normal  effort, no distress ?Abd: gravid ? ?ROS, labs, PMH reviewed ? ?Plan: ?-Admit to L&D ?-Give daily evening dose of Propranolol to assist with BP values/headache ?-If H/A  persists, plan Fioricet or Percocet, and then if still persists will move to dx of SIPE and add in mag sulfate ?- Plan cervical ripening with cytotec to start, followed by cervical foley when able; Pit/AROM prn ?-Anticipate vag del ? ?Arabella Merles CNM ?09/28/2021, 7:54 AM  ?

## 2021-09-28 ENCOUNTER — Encounter (HOSPITAL_COMMUNITY): Payer: Self-pay | Admitting: Obstetrics & Gynecology

## 2021-09-28 LAB — TYPE AND SCREEN
ABO/RH(D): O POS
Antibody Screen: NEGATIVE

## 2021-09-28 LAB — RPR: RPR Ser Ql: NONREACTIVE

## 2021-09-28 LAB — RESP PANEL BY RT-PCR (FLU A&B, COVID) ARPGX2
Influenza A by PCR: NEGATIVE
Influenza B by PCR: NEGATIVE
SARS Coronavirus 2 by RT PCR: NEGATIVE

## 2021-09-28 MED ORDER — FLUTICASONE PROPIONATE 50 MCG/ACT NA SUSP
2.0000 | Freq: Every day | NASAL | Status: DC
  Administered 2021-09-28: 2 via NASAL
  Filled 2021-09-28: qty 16

## 2021-09-28 MED ORDER — PROPRANOLOL HCL ER 60 MG PO CP24
60.0000 mg | ORAL_CAPSULE | Freq: Every day | ORAL | Status: DC
  Administered 2021-09-28 (×2): 60 mg via ORAL
  Filled 2021-09-28 (×3): qty 1

## 2021-09-28 MED ORDER — FLUOXETINE HCL 20 MG PO CAPS
40.0000 mg | ORAL_CAPSULE | Freq: Every day | ORAL | Status: DC
  Administered 2021-09-28 – 2021-10-01 (×3): 40 mg via ORAL
  Filled 2021-09-28 (×5): qty 2

## 2021-09-28 MED ORDER — MISOPROSTOL 50MCG HALF TABLET
50.0000 ug | ORAL_TABLET | ORAL | Status: DC | PRN
  Administered 2021-09-28 (×3): 50 ug via BUCCAL
  Filled 2021-09-28 (×3): qty 1

## 2021-09-28 MED ORDER — MISOPROSTOL 25 MCG QUARTER TABLET
25.0000 ug | ORAL_TABLET | ORAL | Status: DC
  Administered 2021-09-28 (×2): 25 ug via VAGINAL
  Filled 2021-09-28 (×2): qty 1

## 2021-09-28 MED ORDER — TERBUTALINE SULFATE 1 MG/ML IJ SOLN: 0.2500 mg | Freq: Once | INTRAMUSCULAR | Status: DC | PRN

## 2021-09-28 NOTE — Progress Notes (Signed)
Labor Progress Note ?Malyah Ohlrich is a 31 y.o. G1P0000 at [redacted]w[redacted]d presented for IOL d/t worsening cHTN ? ?S: Patient is resting comfortably in bed with mom and partner at bedside ? ?O:  ?BP 121/69   Pulse 74   Temp 98.1 ?F (36.7 ?C) (Oral)   Resp 18   Ht 5\' 6"  (1.676 m)   Wt 104.8 kg   LMP 01/05/2021   SpO2 99%   BMI 37.28 kg/m?  ?EFM: 120s to 130s baseline/mdoerate variability/no decels ? ?CVE: Dilation: 2 ?Effacement (%): 50 ?Cervical Position: Posterior ?Station: -3 ?Presentation: Vertex ?Exam by:: Dr. [email or web address] ? ?A&P: 31 y.o. G1P0000 [redacted]w[redacted]d here for IOL d/t cHTN ?#Labor: Progressing well. FB out, will  ?#Pain: Laboring without pain medications currently ?#FWB: Cat  I ?#GBS negative ? ?cHTN  ?Stable BP in the 120s to 130s systolics currently. Propanolol 60 mg nightly. No migraine currently ? ?Congestion ?Nasal congestion present ?-Flonase ordered ? ?[redacted]w[redacted]d, MD ?[facility] ?9:05 PM  ?

## 2021-09-28 NOTE — Progress Notes (Addendum)
Labor Progress Note ?Sarah Gonzales is a 31 y.o. G1P0000 at [redacted]w[redacted]d presented for IOL d/t worsening cHTN ? ?S: Patient is resting comfortably in bed with mom and partner at bedside ? ?O:  ?BP 137/88   Pulse 79   Temp 98.1 ?F (36.7 ?C) (Oral)   Resp 18   Ht 5\' 6"  (1.676 m)   Wt 104.8 kg   LMP 01/05/2021   SpO2 99%   BMI 37.28 kg/m?  ?EFM: 120s to 130s baseline/mdoerate variability/no decels ? ?CVE: Dilation: 4.5 ?Effacement (%): 70 ?Cervical Position: Posterior ?Station: -3, -2 ?Presentation: Vertex ?Exam by:: [email or web address] RN ? ?A&P: 31 y.o. G1P0000 [redacted]w[redacted]d here for IOL d/t cHTN ?#Labor: Progressing well. S/p cytotec x 5, will start pitocin 2x2 and titrate up  ?#Pain: Laboring without pain medications currently ?#FWB: Cat  I ?#GBS negative ? ?cHTN  ?Stable BP in the 120s to 130s systolics currently. Propanolol 60 mg nightly given. Asymptomatic ?-monitor ? ?Congestion ?Nasal congestion present, getting a little better ?-Flonase ordered ?-saline nasal spray added ? ?[redacted]w[redacted]d, MD ?[facility] ?12:20 AM  ?

## 2021-09-28 NOTE — Progress Notes (Signed)
Labor Progress Note ?Sarah Gonzales is a 31 y.o. G1P0000 at [redacted]w[redacted]d presented for IOL due to worsening cHTN. ? ?S: Resting comfortably in bed, partner at bedside. ? ?O:  ?BP (!) 131/94   Pulse 80   Temp (!) 97.4 ?F (36.3 ?C) (Oral)   Resp 18   Ht 5\' 6"  (1.676 m)   Wt 104.8 kg   LMP 01/05/2021   SpO2 99%   BMI 37.28 kg/m?  ?EFM: Baseline FHR 125 bpm/moderate variability/+accels, no decels ? ?CVE: Dilation: 2 ?Effacement (%): 50 ?Cervical Position: Posterior ?Station: -3 ?Presentation: Vertex ?Exam by:: Dr. [email or web address] ? ? ?A&P: 31 y.o. G1P0000 [redacted]w[redacted]d  ?#Labor: Progressing well. FB placed without difficulty. S/p cytotec x3. Will continue with additional buccal cytotec along with FB for further cervical ripening. Will reassess in 4 hours. ?#Pain: Labor support without medications ?#FWB: Cat 1 ?#GBS negative ? ?#cHTN: Stable. Mild range BP's. Negative PEC labs on admission. H/o of migraines, takes propanolol XL 60 mg daily for prophylaxis. Remains asymptomatic at this time. Will continue to monitor.  ? ?[redacted]w[redacted]d, DO PGY-1 ?09/28/2021, 3:20 PM ? ? ?

## 2021-09-28 NOTE — Progress Notes (Signed)
S: ?Patient reports feeling contractions but they are not painful. Patient denies HA, vision changes, and RUQ pain. Has no questions/ concerns at this time ? ?O: ?Vitals:  ? 09/28/21 0201 09/28/21 0305 09/28/21 0401 09/28/21 0502  ?BP: 111/62 123/60 130/79 (!) 145/75  ?Pulse: 79 75 75 78  ?Resp: 18 18 18 18   ?Temp:    (!) 97.4 ?F (36.3 ?C)  ?TempSrc:    Oral  ?SpO2:      ?Weight:      ?Height:      ? ? ?FHT:  FHR: 125 bpm, variability: moderate,  accelerations:  Present,  decelerations:  Absent ?UC:   irregular  ?SVE:   Dilation: Fingertip ?Effacement (%): Thick ?Station: Ballotable ?Exam by:: [email or web address] SNM ? ?A / P: ?31 y.o. G1P0 at [redacted]w[redacted]d here for IOL due to worsening cHTN, patient asymptomatic  ? ?-cHTN- On Propranolol daily. Bps mildy elevated 130s/80s. Pc ratio 0.13 and other Pre-e labs normal. Will continue to monitor bps. ?-Cytotec x2- cervical ripening, cervix unchanged. Discussed foley balloon as next check and patient agrees with the plan.  ?-GBS negative ?Fetal Wellbeing:  Category I ?Pain Control:  Labor support without medications ?Anticipated MOD:  NSVD ? ?[redacted]w[redacted]d Eveny Anastas,SNM ?09/28/2021, 5:05 AM ? ?   ?

## 2021-09-29 ENCOUNTER — Ambulatory Visit: Payer: Commercial Managed Care - PPO

## 2021-09-29 ENCOUNTER — Inpatient Hospital Stay (HOSPITAL_COMMUNITY): Payer: Commercial Managed Care - PPO | Admitting: Anesthesiology

## 2021-09-29 ENCOUNTER — Encounter: Payer: Commercial Managed Care - PPO | Admitting: Obstetrics and Gynecology

## 2021-09-29 ENCOUNTER — Encounter (HOSPITAL_COMMUNITY): Payer: Self-pay | Admitting: Obstetrics & Gynecology

## 2021-09-29 DIAGNOSIS — O10913 Unspecified pre-existing hypertension complicating pregnancy, third trimester: Secondary | ICD-10-CM | POA: Diagnosis not present

## 2021-09-29 DIAGNOSIS — Z3A38 38 weeks gestation of pregnancy: Secondary | ICD-10-CM | POA: Diagnosis not present

## 2021-09-29 LAB — CBC
HCT: 38.6 % (ref 36.0–46.0)
Hemoglobin: 13.1 g/dL (ref 12.0–15.0)
MCH: 27.2 pg (ref 26.0–34.0)
MCHC: 33.9 g/dL (ref 30.0–36.0)
MCV: 80.1 fL (ref 80.0–100.0)
Platelets: 205 10*3/uL (ref 150–400)
RBC: 4.82 MIL/uL (ref 3.87–5.11)
RDW: 13.8 % (ref 11.5–15.5)
WBC: 15.5 10*3/uL — ABNORMAL HIGH (ref 4.0–10.5)
nRBC: 0 % (ref 0.0–0.2)

## 2021-09-29 MED ORDER — SODIUM CHLORIDE 0.9 % IV SOLN: 250.0000 mL | INTRAVENOUS | Status: DC | PRN

## 2021-09-29 MED ORDER — IBUPROFEN 600 MG PO TABS
600.0000 mg | ORAL_TABLET | Freq: Four times a day (QID) | ORAL | Status: DC
  Administered 2021-09-30 – 2021-10-01 (×7): 600 mg via ORAL
  Filled 2021-09-29 (×7): qty 1

## 2021-09-29 MED ORDER — PHENYLEPHRINE 40 MCG/ML (10ML) SYRINGE FOR IV PUSH (FOR BLOOD PRESSURE SUPPORT): 80.0000 ug | PREFILLED_SYRINGE | INTRAVENOUS | Status: DC | PRN

## 2021-09-29 MED ORDER — SENNOSIDES-DOCUSATE SODIUM 8.6-50 MG PO TABS
2.0000 | ORAL_TABLET | ORAL | Status: DC
  Administered 2021-09-30 – 2021-10-01 (×2): 2 via ORAL
  Filled 2021-09-29 (×2): qty 2

## 2021-09-29 MED ORDER — PROPRANOLOL HCL ER 60 MG PO CP24
60.0000 mg | ORAL_CAPSULE | Freq: Every day | ORAL | Status: DC
  Administered 2021-09-30 (×2): 60 mg via ORAL
  Filled 2021-09-29 (×3): qty 1

## 2021-09-29 MED ORDER — ZOLPIDEM TARTRATE 5 MG PO TABS: 5.0000 mg | ORAL_TABLET | Freq: Every evening | ORAL | Status: DC | PRN

## 2021-09-29 MED ORDER — PRENATAL MULTIVITAMIN CH
1.0000 | ORAL_TABLET | Freq: Every day | ORAL | Status: DC
  Administered 2021-09-30 – 2021-10-01 (×2): 1 via ORAL
  Filled 2021-09-29 (×2): qty 1

## 2021-09-29 MED ORDER — WITCH HAZEL-GLYCERIN EX PADS: 1.0000 "application " | MEDICATED_PAD | CUTANEOUS | Status: DC | PRN

## 2021-09-29 MED ORDER — MEASLES, MUMPS & RUBELLA VAC IJ SOLR: 0.5000 mL | Freq: Once | INTRAMUSCULAR | Status: DC

## 2021-09-29 MED ORDER — SIMETHICONE 80 MG PO CHEW: 80.0000 mg | CHEWABLE_TABLET | ORAL | Status: DC | PRN

## 2021-09-29 MED ORDER — EPHEDRINE 5 MG/ML INJ: 10.0000 mg | INTRAVENOUS | Status: DC | PRN

## 2021-09-29 MED ORDER — DIPHENHYDRAMINE HCL 25 MG PO CAPS: 25.0000 mg | ORAL_CAPSULE | Freq: Four times a day (QID) | ORAL | Status: DC | PRN

## 2021-09-29 MED ORDER — COCONUT OIL OIL
1.0000 "application " | TOPICAL_OIL | Status: DC | PRN
  Administered 2021-09-30: 1 via TOPICAL

## 2021-09-29 MED ORDER — SODIUM CHLORIDE 0.9% FLUSH: 3.0000 mL | INTRAVENOUS | Status: DC | PRN

## 2021-09-29 MED ORDER — DIBUCAINE (PERIANAL) 1 % EX OINT: 1.0000 "application " | TOPICAL_OINTMENT | CUTANEOUS | Status: DC | PRN

## 2021-09-29 MED ORDER — ONDANSETRON HCL 4 MG PO TABS
4.0000 mg | ORAL_TABLET | ORAL | Status: DC | PRN
Start: 2021-09-29 — End: 2021-10-01

## 2021-09-29 MED ORDER — TETANUS-DIPHTH-ACELL PERTUSSIS 5-2.5-18.5 LF-MCG/0.5 IM SUSY: 0.5000 mL | PREFILLED_SYRINGE | Freq: Once | INTRAMUSCULAR | Status: DC

## 2021-09-29 MED ORDER — OXYTOCIN-SODIUM CHLORIDE 30-0.9 UT/500ML-% IV SOLN
1.0000 m[IU]/min | INTRAVENOUS | Status: DC
  Administered 2021-09-29: 2 m[IU]/min via INTRAVENOUS

## 2021-09-29 MED ORDER — DIPHENHYDRAMINE HCL 50 MG/ML IJ SOLN: 12.5000 mg | INTRAMUSCULAR | Status: DC | PRN

## 2021-09-29 MED ORDER — TERBUTALINE SULFATE 1 MG/ML IJ SOLN: 0.2500 mg | Freq: Once | INTRAMUSCULAR | Status: DC | PRN

## 2021-09-29 MED ORDER — BENZOCAINE-MENTHOL 20-0.5 % EX AERO
1.0000 "application " | INHALATION_SPRAY | CUTANEOUS | Status: DC | PRN
  Administered 2021-09-30 – 2021-10-01 (×2): 1 via TOPICAL
  Filled 2021-09-29 (×2): qty 56

## 2021-09-29 MED ORDER — ONDANSETRON HCL 4 MG/2ML IJ SOLN: 4.0000 mg | INTRAMUSCULAR | Status: DC | PRN

## 2021-09-29 MED ORDER — FENTANYL-BUPIVACAINE-NACL 0.5-0.125-0.9 MG/250ML-% EP SOLN
12.0000 mL/h | EPIDURAL | Status: DC | PRN
  Administered 2021-09-29: 12 mL/h via EPIDURAL
  Filled 2021-09-29: qty 250

## 2021-09-29 MED ORDER — LIDOCAINE HCL (PF) 1 % IJ SOLN
INTRAMUSCULAR | Status: DC | PRN
  Administered 2021-09-29: 8 mL via EPIDURAL

## 2021-09-29 MED ORDER — LACTATED RINGERS IV SOLN
500.0000 mL | Freq: Once | INTRAVENOUS | Status: AC
  Administered 2021-09-29: 500 mL via INTRAVENOUS

## 2021-09-29 MED ORDER — ACETAMINOPHEN 325 MG PO TABS: 650.0000 mg | ORAL_TABLET | ORAL | Status: DC | PRN

## 2021-09-29 MED ORDER — SALINE SPRAY 0.65 % NA SOLN
1.0000 | NASAL | Status: DC | PRN
  Filled 2021-09-29: qty 44

## 2021-09-29 MED ORDER — FUROSEMIDE 20 MG PO TABS
20.0000 mg | ORAL_TABLET | Freq: Every day | ORAL | Status: DC
  Administered 2021-09-30 – 2021-10-01 (×2): 20 mg via ORAL
  Filled 2021-09-29 (×2): qty 1

## 2021-09-29 MED ORDER — SODIUM CHLORIDE 0.9% FLUSH
3.0000 mL | Freq: Two times a day (BID) | INTRAVENOUS | Status: DC
  Administered 2021-09-30: 3 mL via INTRAVENOUS

## 2021-09-29 NOTE — Progress Notes (Signed)
On recheck, station still about 0 per RN. They attempted a few practice pushes with some movement but return back to 0 station. Not feeling urge. Will see if she can rest briefly and restart again in the next hour.  ? ?Allayne Stack, DO  ?

## 2021-09-29 NOTE — Lactation Note (Signed)
This note was copied from a baby's chart. ?Lactation Consultation Note ? ?Patient Name: Sarah Gonzales ?Today's Date: 09/29/2021 ?Reason for consult: L&D Initial assessment;Primapara;1st time breastfeeding;Early term 37-38.6wks;Breastfeeding assistance;Other (Comment) ?Age: LC arrived at 45 mins PP  ?Baby noted to have some congestion lying STS on moms chest. LC waited few minutes and baby started rooting. LC moved the baby to the right breast and baby latched easily with depth and fed 17 mins. LC released the latch due to the side of mom nipple showing due to the baby sliding away from being deeply latched.  ?Baby still routing and LC assisted to relatch and baby still feeding/ per mom comfortable. Congestion resolved. Latch score 9 x 2 .  ? ? ?Maternal Data ?Has patient been taught Hand Expression?: Yes ?Does the patient have breastfeeding experience prior to this delivery?: No ? ?Feeding ?Mother's Current Feeding Choice: Breast Milk ? ?LATCH Score ?Latch: Grasps breast easily, tongue down, lips flanged, rhythmical sucking. ? ?Audible Swallowing: Spontaneous and intermittent ? ?Type of Nipple: Everted at rest and after stimulation ? ?Comfort (Breast/Nipple): Soft / non-tender ? ?Hold (Positioning): Assistance needed to correctly position infant at breast and maintain latch. ? ?LATCH Score: 9 ? ? ?Lactation Tools Discussed/Used ?  ? ?Interventions ?Interventions: Breast feeding basics reviewed;Education ? ?Discharge ?  ? ?Consult Status ?Consult Status: Follow-up from L&D ?Date: 09/29/21 ?Follow-up type: In-patient ? ? ? ?Sarah Gonzales ?09/29/2021, 6:03 PM ? ? ? ?

## 2021-09-29 NOTE — Progress Notes (Signed)
Started pushing around 1411, initially had difficulty with palpating/monitoring contractions but after replacing toco we have had more success.   ? ?Pushing with good maternal effort. Able to see fetal head through the introitus with pushing, but then rocks but to 1+ when resting.  ? ?FHT currently reassuring. Difficult to feel fetal positioning, but does an irregular contraction pattern.  ? ?Continue pushing.  ? ?Allayne Stack, DO  ? ? ?

## 2021-09-29 NOTE — Progress Notes (Signed)
Sarah Gonzales is a 31 y.o. G1P0000 at [redacted]w[redacted]d admitted for induction of labor due to [place]. ? ?Subjective: ?Doing well. Coping well with contractions. Pain 1-2/10.  ? ?Objective: ?BP (!) 137/93   Pulse 82   Temp 97.7 ?F (36.5 ?C) (Oral)   Resp 20   Ht 5\' 6"  (1.676 m)   Wt 104.8 kg   LMP 01/05/2021   SpO2 99%   BMI 37.28 kg/m?  ?No intake/output data recorded. ?No intake/output data recorded. ? ?FHT:  FHR: 120 bpm, variability: moderate,  accelerations:  Present,  decelerations:  Absent ?UC:   regular, every 1-3 minutes ?SVE:   Dilation: 4.5 ?Effacement (%): 70 ?Station: -2, -3 ?Exam by:: Dr [email or web address] ? ?Labs: ?Lab Results  ?Component Value Date  ? WBC 11.7 (H) 09/27/2021  ? HGB 12.6 09/27/2021  ? HCT 38.0 09/27/2021  ? MCV 81.0 09/27/2021  ? PLT 196 09/27/2021  ? ? ?Assessment / Plan: ?G1P0000 at [redacted]w[redacted]d admitted for induction of labor due to [facility]. ? ?Labor:  s/p foley balloon and now on pitocin for ~4 hours. Cervix ~4.5cm and 60-70% effaced. Head well applied. AROM done during check and tolerated well by fetus and patient. Will uptitrate pitocin as appropriate and reassess in 4 hours or sooner if clinically indicated. ?Fetal Wellbeing:  Category I ?Pain Control:  Plans unmedicated ?I/D:   GBS negative ? ?#cHTN ?BP in 130s/90s. No symptoms at this time. Continue to monitor and continue home dose of propranolol. ? ?[facility] ?09/29/2021, 4:48 AM ? ? ?

## 2021-09-29 NOTE — Progress Notes (Signed)
Labor Progress Note ?Sarah Gonzales is a 31 y.o. G1P0000 at [redacted]w[redacted]d presented for IOL due to [place].  ? ?S: Doing well.  ? ?O:  ?BP 127/79   Pulse 84   Temp 97.7 ?F (36.5 ?C) (Oral)   Resp 18   Ht 5\' 6"  (1.676 m)   Wt 104.8 kg   LMP 01/05/2021   SpO2 99%   BMI 37.28 kg/m?  ?EFM: 115/mod/15x15/none ? ?CVE: Dilation: 10 ?Dilation Complete Date: 09/29/21 ?Dilation Complete Time: 1058 ?Effacement (%): 90 ?Cervical Position: Posterior ?Station: 0 ?Presentation: Vertex ? ? ?A&P: 31 y.o. G1P0000 [redacted]w[redacted]d  ?#Labor: Progressing well and now complete. FHT has been jumping into the 200's for periods of time irregularly with a reassuring tracing when back to baseline around 120--likely monitor doubling rather than FHR but asked if an FSE could be placed to ensure this is the case. After verbal consent, placed an FSE. She has no urge quite yet with reassuring Cat I tracing after FSE placed. Plan to recheck in 1 hr or sooner if urge. Reviewed birth plan with patient and spouse.  ?#Pain: Epidural  ?#FWB: Cat I  ?#GBS negative ? ?#cHTN: BP currently WNL.  ? ?[redacted]w[redacted]d, DO ?11:23 AM  ?

## 2021-09-29 NOTE — Anesthesia Preprocedure Evaluation (Signed)
Anesthesia Evaluation  ?Patient identified by MRN, date of birth, ID band ?Patient awake ? ? ? ?Reviewed: ?Allergy & Precautions, H&P , NPO status , Patient's Chart, lab work & pertinent test results, reviewed documented beta blocker date and time  ? ?Airway ?Mallampati: III ? ?TM Distance: >3 FB ?Neck ROM: full ? ? ? Dental ?no notable dental hx. ?(+) Teeth Intact, Dental Advisory Given ?  ?Pulmonary ?neg pulmonary ROS,  ?  ?Pulmonary exam normal ?breath sounds clear to auscultation ? ? ? ? ? ? Cardiovascular ?hypertension, Pt. on medications and Pt. on home beta blockers ?negative cardio ROS ?Normal cardiovascular exam ?Rhythm:regular Rate:Normal ? ? ?  ?Neuro/Psych ?negative neurological ROS ? negative psych ROS  ? GI/Hepatic ?negative GI ROS, Neg liver ROS,   ?Endo/Other  ?Morbid obesity ? Renal/GU ?negative Renal ROS  ?negative genitourinary ?  ?Musculoskeletal ? ? Abdominal ?  ?Peds ? Hematology ?negative hematology ROS ?(+)   ?Anesthesia Other Findings ? ? Reproductive/Obstetrics ?(+) Pregnancy ? ?  ? ? ? ? ? ? ? ? ? ? ? ? ? ?  ?  ? ? ? ? ? ? ? ? ?Anesthesia Physical ?Anesthesia Plan ? ?ASA: 3 ? ?Anesthesia Plan: Epidural  ? ?Post-op Pain Management: Minimal or no pain anticipated  ? ?Induction:  ? ?PONV Risk Score and Plan: 2 and Treatment may vary due to age or medical condition ? ?Airway Management Planned: Natural Airway ? ?Additional Equipment: None ? ?Intra-op Plan:  ? ?Post-operative Plan:  ? ?Informed Consent: I have reviewed the patients History and Physical, chart, labs and discussed the procedure including the risks, benefits and alternatives for the proposed anesthesia with the patient or authorized representative who has indicated his/her understanding and acceptance.  ? ? ? ?Dental Advisory Given ? ?Plan Discussed with: Anesthesiologist ? ?Anesthesia Plan Comments: (Labs checked- platelets confirmed with RN in room. Fetal heart tracing, per RN, reported to be  stable enough for sitting procedure. ?Discussed epidural, and patient consents to the procedure:  included risk of possible headache,backache, failed block, allergic reaction, and nerve injury. This patient was asked if she had any questions or concerns before the procedure started.)  ? ? ? ? ? ? ?Anesthesia Quick Evaluation ? ?

## 2021-09-29 NOTE — Anesthesia Procedure Notes (Signed)
Epidural ?Patient location during procedure: OB ?Start time: 09/29/2021 6:29 AM ?End time: 09/29/2021 6:34 AM ? ?Staffing ?Anesthesiologist: Bethena Midget, MD ? ?Preanesthetic Checklist ?Completed: patient identified, IV checked, site marked, risks and benefits discussed, surgical consent, monitors and equipment checked, pre-op evaluation and timeout performed ? ?Epidural ?Patient position: sitting ?Prep: DuraPrep and site prepped and draped ?Patient monitoring: continuous pulse ox and blood pressure ?Approach: midline ?Location: L4-L5 ?Injection technique: LOR air ? ?Needle:  ?Needle type: Tuohy  ?Needle gauge: 17 G ?Needle length: 9 cm and 9 ?Needle insertion depth: 8 cm ?Catheter type: closed end flexible ?Catheter size: 19 Gauge ?Catheter at skin depth: 13 cm ?Test dose: negative ? ?Assessment ?Events: blood not aspirated, injection not painful, no injection resistance, no paresthesia and negative IV test ? ? ? ?

## 2021-09-29 NOTE — Discharge Summary (Addendum)
? ?  Postpartum Discharge Summary ? ?   ?Patient Name: Sarah Gonzales ?DOB: 1991-04-24 ?MRN: [id number] ? ?Date of admission: 09/27/2021 ?Delivery date:09/29/2021  ?Delivering provider: Patriciaann Clan  ?Date of discharge: 10/01/2021 ? ?Admitting diagnosis: Chronic hypertension affecting pregnancy [O10.919] ?Intrauterine pregnancy: [redacted]w[redacted]d    ?Secondary diagnosis:  Principal Problem: ?  Chronic hypertension affecting pregnancy ?Active Problems: ?  Migraine headache ?  Depression affecting pregnancy ? ?Additional problems: None    ?Discharge diagnosis: Term Pregnancy Delivered and CHTN                                              ?Post partum procedures:None ?Augmentation: AROM, Pitocin, Cytotec, and IP Foley ?Complications: None ? ?Hospital course: Induction of Labor With Vaginal Delivery   ?31y.o. yo G1P1001 at 370w1das admitted to the hospital 09/27/2021 for induction of labor.  Indication for induction:  cHTN .  Patient had an uncomplicated labor course as follows: ?Membrane Rupture Time/Date: 4:38 AM ,09/29/2021   ?Delivery Method:Vaginal, Spontaneous  ?Episiotomy: None  ?Lacerations:    ?Details of delivery can be found in separate delivery note.  Blood pressure noted to be elevated- discharged home with Norvasc, Lasix and prior medication of Propranolol XL 6057maily.  Patient had a routine postpartum course. Patient is discharged home 10/01/21. ? ?Newborn Data: ?Birth date:09/29/2021  ?Birth time:5:02 PM  ?Gender:Female  ?Living status:Living  ?Apgars:9 ,9  ?Weight:2850 g  ? ?Magnesium Sulfate received: No ?BMZ received: No ?Rhophylac:No ?MMR:No ?T-DaP:Given prenatally ?Flu: No ?Transfusion:No ? ?Physical exam  ?Vitals:  ? 09/30/21 1300 09/30/21 2130 10/01/21 0611 10/01/21 0852  ?BP: 132/69 130/81 140/88 (!) 151/92  ?Pulse: 84 79 66 80  ?Resp: _0 ?Temp: 98.4 ?F (36.9 ?C) 98.2 ?F (36.8 ?C) 97.7 ?F (36.5 ?C) 98 ?F (36.7 ?C)  ?TempSrc: Oral Oral Oral   ?SpO2:      ?Weight:      ?Height:      ? ?General: alert,  cooperative, and no distress ?Lochia: appropriate ?Uterine Fundus: firm ?Incision: N/A ?DVT Evaluation: No evidence of DVT seen on physical exam. ?No significant calf/ankle edema. ?Labs: ?Lab Results  ?Component Value Date  ? WBC 15.5 (H) 09/29/2021  ? HGB 13.1 09/29/2021  ? HCT 38.6 09/29/2021  ? MCV 80.1 09/29/2021  ? PLT 205 09/29/2021  ? ?CMP Latest Ref Rng & Units 09/27/2021  ?Glucose 70 - 99 mg/dL 110(H)  ?BUN 6 - 20 mg/dL 8  ?Creatinine 0.44 - 1.00 mg/dL 0.60  ?Sodium 135 - 145 mmol/L 134(L)  ?Potassium 3.5 - 5.1 mmol/L 4.7  ?Chloride 98 - 111 mmol/L 102  ?CO2 22 - 32 mmol/L 21(L)  ?Calcium 8.9 - 10.3 mg/dL 9.4  ?Total Protein 6.5 - 8.1 g/dL 5.9(L)  ?Total Bilirubin 0.3 - 1.2 mg/dL 1.2  ?Alkaline Phos 38 - 126 U/L 109  ?AST 15 - 41 U/L 43(H)  ?ALT 0 - 44 U/L 10  ? ?Edinburgh Score: ?Edinburgh Postnatal Depression Scale Screening Tool 10/01/2021  ?I have been able to laugh and see the funny side of things. 0  ?I have looked forward with enjoyment to things. 0  ?I have blamed myself unnecessarily when things went wrong. 0  ?I have been anxious or worried for no good reason. 0  ?I have felt scared or panicky for no good reason. 0  ?Things have  been getting on top of me. 0  ?I have been so unhappy that I have had difficulty sleeping. 0  ?I have felt sad or miserable. 0  ?I have been so unhappy that I have been crying. 0  ?The thought of harming myself has occurred to me. 0  ?Edinburgh Postnatal Depression Scale Total 0  ? ? ? ?After visit meds:  ?Allergies as of 10/01/2021   ?No Known Allergies ?  ? ?  ?Medication List  ?  ? ?STOP taking these medications   ? ?aspirin EC 81 MG tablet ?  ? ?  ? ?TAKE these medications   ? ?acetaminophen 325 MG tablet ?Commonly known as: Tylenol ?Take 2 tablets (650 mg total) by mouth every 4 (four) hours as needed (for pain scale < 4). ?  ?amLODipine 5 MG tablet ?Commonly known as: NORVASC ?Take 1 tablet (5 mg total) by mouth daily. ?  ?FLUoxetine 20 MG capsule ?Commonly known as:  PROZAC ?Take 2 capsules by mouth once daily ?  ?furosemide 20 MG tablet ?Commonly known as: LASIX ?Take 1 tablet (20 mg total) by mouth daily for 5 days. ?  ?ibuprofen 600 MG tablet ?Commonly known as: ADVIL ?Take 1 tablet (600 mg total) by mouth every 6 (six) hours. ?  ?multivitamin-prenatal 27-0.8 MG Tabs tablet ?Take 1 tablet by mouth daily at 12 noon. ?  ?propranolol ER 60 MG 24 hr capsule ?Commonly known as: INDERAL LA ?Take 1 capsule by mouth once daily ?  ? ?  ? ? ? ?Discharge home in stable condition ?Infant Feeding: Breast ?Infant Disposition:home with mother ?Discharge instruction: per After Visit Summary and Postpartum booklet. ?Activity: Advance as tolerated. Pelvic rest for 6 weeks.  ?Diet: routine diet ?Future Appointments:No future appointments. ?Follow up Visit: ? Follow-up Information   ? ? [facility] [place]. Schedule an appointment as soon as possible for a visit in 6 week(s).   ?Specialty: Obstetrics and Gynecology ?Why: postpartum visit and IUD insertion ?Contact information: ?[address], [address] ?[place] ?[phone] ? ?  ?  ? ?  ?  ? ?  ? ? ?Message sent to [facility] by Dr Higinio Plan:  ?Please schedule this patient for a In person postpartum visit in 6 weeks with the following provider: Any provider. ?Additional Postpartum F/U:BP check 1 week  ?High risk pregnancy complicated by: HTN ?Delivery mode:  Vaginal, Spontaneous  ?Anticipated Birth Control:  IUD at pp visit  ? ? ?Attestation of Attending Supervision of Advanced Practice Provider (PA/CNM/NP): Evaluation and management procedures were performed by the Advanced Practice Provider under my supervision and collaboration.  I have reviewed the Advanced Practice Provider's note and chart, and I agree with the management and plan. I have also made any necessary editorial changes. ? ? ?Annalee Genta, DO ?Attending [place], Faculty Practice ?[facility],  [place] ?10/01/2021 9:54 AM ? ? ? ?

## 2021-09-29 NOTE — Lactation Note (Signed)
This note was copied from a baby's chart. ?Lactation Consultation Note ? ?Patient Name: Sarah Gonzales ?Today's Date: 09/29/2021 ?Reason for consult: Initial assessment;1st time breastfeeding;Early term 37-38.6wks ?Age:31 hours ?See mom's MR for her hx  ?P1, ETI female infant. ?Mom requested LC infant not latching on MBU. ?Mom attempted to latch infant on  her left breast using the football hold position, infant latched but did not elicit swallowing only held nipple in mouth.  ?Afterwards mom was taught how to hand express, LC used breast model and mom self expressed 4 mls of colostrum that was spoon fed to infant. ?LC gave mom hand pump with 27 mm breast flange mom expressed 3 mls of colostrum. ?Mom knows to breastfeed infant according to hunger cues, 8 to 12+ times within 24 hours, skin to skin. ?Mom knows to call RN/LC if she need further assistance with latching infant at the breast. ?Mom will use hand pump and give infant back any EBM if infant doesn't latch at the breast. ?Mom made aware of O/P services, breastfeeding support groups, community resources, and our phone # for post-discharge questions.   ? ?Maternal Data ?Has patient been taught Hand Expression?: Yes ?Does the patient have breastfeeding experience prior to this delivery?: No ? ?Feeding ?Mother's Current Feeding Choice: Breast Milk ? ?LATCH Score ?Latch: Too sleepy or reluctant, no latch achieved, no sucking elicited. ? ?Audible Swallowing: None ? ?Type of Nipple: Everted at rest and after stimulation ? ?Comfort (Breast/Nipple): Soft / non-tender ? ?Hold (Positioning): Assistance needed to correctly position infant at breast and maintain latch. ? ?LATCH Score: 5 ? ? ?Lactation Tools Discussed/Used ?Tools: Pump ?Breast pump type: Manual ?Pump Education: Setup, frequency, and cleaning;Milk Storage ?Reason for Pumping: Infant not latching well at breast, ETI infant. ?Pumping frequency: Mom will pump prn if infant doesnt latch at breast. ?Pumped volume:  3 mL ? ?Interventions ?Interventions: Breast feeding basics reviewed;Assisted with latch;Skin to skin;Hand express;Reverse pressure;Adjust position;Support pillows;Breast compression;Position options;Expressed milk;Hand pump;Education;LC Services brochure ? ?Discharge ?  ? ?Consult Status ?Consult Status: Follow-up ?Date: 09/30/21 ?Follow-up type: In-patient ? ? ? ?Danelle Earthly ?09/29/2021, 10:22 PM ? ? ? ?

## 2021-09-30 MED ORDER — FUROSEMIDE 20 MG PO TABS: 20.0000 mg | ORAL_TABLET | Freq: Every day | ORAL | 0 refills | Status: DC

## 2021-09-30 MED ORDER — IBUPROFEN 600 MG PO TABS
600.0000 mg | ORAL_TABLET | Freq: Four times a day (QID) | ORAL | 0 refills | Status: DC
Start: 2021-09-30 — End: 2022-01-19

## 2021-09-30 MED ORDER — ACETAMINOPHEN 325 MG PO TABS: 650.0000 mg | ORAL_TABLET | ORAL | 0 refills | Status: DC | PRN

## 2021-09-30 NOTE — Progress Notes (Addendum)
POSTPARTUM PROGRESS NOTE ? ?Post Partum Day 1 ? ?Subjective: ? ?Sarah Gonzales is a 31 y.o. G1P1001 s/p VD  at [redacted]w[redacted]d.  She reports she is doing well. No acute events overnight. She denies any problems with ambulating, voiding or po intake. Denies nausea or vomiting.  Pain is well controlled.  Lochia is appropriate, no clots. ? ?Objective: ?Blood pressure 138/82, pulse 88, temperature 98.7 ?F (37.1 ?C), temperature source Oral, resp. rate 16, height 5\' 6"  (1.676 m), weight 104.8 kg, last menstrual period 01/05/2021, SpO2 99 %, unknown if currently breastfeeding. ? ?Physical Exam:  ?General: alert, cooperative and no distress ?Chest: no respiratory distress ?Heart:regular rate, distal pulses intact ?Abdomen: soft, nontender,  ?Uterine Fundus: firm, appropriately tender ?DVT Evaluation: No calf swelling or tenderness ?Extremities: no LE edema ?Skin: warm, dry ? ?Recent Labs  ?  09/27/21 ?2202 09/29/21 ?11/29/21  ?HGB 12.6 13.1  ?HCT 38.0 38.6  ? ? ?Assessment/Plan: ?Sarah Gonzales is a 31 y.o. G1P1001 s/p VD at [redacted]w[redacted]d  ? ?PPD#1 - Doing well ? Routine postpartum care ?Contraception: Outpatient IUD ?Feeding: Breast-having some issues with latching and would like to dis ?Dispo: Plan for discharge either today or tomorrow. ? ?#cHTN ?1 elevated BP to 150/91 this morning 126/71 ?-Propanolol 60 mg ?-Lasix ?-serial BPs ? ? LOS: 3 days  ? ?[redacted]w[redacted]d, MD  ?FM PGY-1 ?09/30/2021, 12:51 AM  ? ?Midwife attestation ?Post Partum Day 1 ?I have seen and examined this patient and agree with above documentation in the resident's note.  ? ?Sarah Gonzales is a 31 y.o. G1P1001 s/p SVD.  Pt denies problems with ambulating, voiding or po intake. Pain is well controlled.  Plan for birth control is IUD.  Method of Feeding: breast ? ?PE:  ?BP 126/71 (BP Location: Left Arm)   Pulse 83   Temp 98.2 ?F (36.8 ?C) (Oral)   Resp 16   Ht 5\' 6"  (1.676 m)   Wt 231 lb (104.8 kg)   LMP 01/05/2021   SpO2 98%   Breastfeeding Unknown   BMI 37.28 kg/m?   ?Gen: well appearing ?Heart: reg rate ?Lungs: normal WOB ?Fundus firm ?Ext: soft, no pain, no edema ? ?Plan for discharge: Tomorrow, but is interested in discharge at 24 hours, after 5 pm today if baby can be discharged.  ? ? , CNM ?9:56 AM  ?

## 2021-09-30 NOTE — Anesthesia Postprocedure Evaluation (Signed)
Anesthesia Post Note ? ?Patient: Sarah Gonzales ? ?Procedure(s) Performed: AN AD HOC LABOR EPIDURAL ? ?  ? ?Patient location during evaluation: Mother Baby ?Anesthesia Type: Epidural ?Level of consciousness: awake and alert ?Pain management: pain level controlled ?Vital Signs Assessment: post-procedure vital signs reviewed and stable ?Respiratory status: spontaneous breathing, nonlabored ventilation and respiratory function stable ?Cardiovascular status: stable ?Postop Assessment: no headache, no backache and epidural receding ?Anesthetic complications: no ? ? ?No notable events documented. ? ?Last Vitals:  ?Vitals:  ? 09/30/21 0100 09/30/21 0553  ?BP: 136/78 126/71  ?Pulse: 82 83  ?Resp: 16 16  ?Temp: 37 ?C 36.8 ?C  ?SpO2: 99% 98%  ?  ?Last Pain:  ?Vitals:  ? 09/30/21 0553  ?TempSrc: Oral  ?PainSc:   ? ?Pain Goal: Patients Stated Pain Goal: 0 (09/28/21 0019) ? ?  ?  ?  ?  ?  ?  ?  ? ?Rim Thatch ? ? ? ? ?

## 2021-09-30 NOTE — Social Work (Signed)
MOB was referred for history of anxiety.  ? ?* Referral screened out by Clinical Social Worker because none of the following criteria appear to apply: ? ?~ History of anxiety/depression during this pregnancy, or of post-partum depression following prior delivery. ?~ Diagnosis of anxiety and/or depression within last 3 years. ?OR ?* MOB's symptoms currently being treated with medication and/or therapy. CSW reviewed chart and notes MOB currently prescribed Prozac 20mg.  ? ?Please contact the Clinical Social Worker if needs arise, by MOB request, or if MOB scores greater than 9/yes to question 10 on Edinburgh Postpartum Depression Screen. ? ?Jakyle Petrucelli, LCSWA ?Clinical Social Work ?[facility]  ?[phone]  ?

## 2021-10-01 MED ORDER — AMLODIPINE BESYLATE 5 MG PO TABS: 5.0000 mg | ORAL_TABLET | Freq: Every day | ORAL | 0 refills | Status: DC

## 2021-10-01 MED ORDER — AMLODIPINE BESYLATE 5 MG PO TABS
5.0000 mg | ORAL_TABLET | Freq: Every day | ORAL | Status: DC
Start: 2021-10-01 — End: 2021-10-01
  Administered 2021-10-01: 5 mg via ORAL
  Filled 2021-10-01: qty 1

## 2021-10-01 NOTE — Lactation Note (Signed)
This note was copied from a baby's chart. ?Lactation Consultation Note ? ?Patient Name: [name] ?Today's Date: 10/01/2021 ?Reason for consult: Follow-up assessment;1st time breastfeeding;Primapara;Early term 37-38.6wks ?Age:31 hours ? ?LC in to visit with P1 Mom of ET infant on day of discharge.   Baby with 1% weight loss and bilirubin below light level but trending up.  Baby to see Ped [facility]) tomorrow.   ? ?Baby was just circumcised and was sleeping STS on Mom's chest.  Encouraged Mom to continue to do STS to stimulate frequent feedings.  Mom reports baby latches deeply (8 feedings last 24 hrs) and no discomfort felt. No visible nipple trauma noted. Baby is having good output.   ? ?Encouraged continued STS and frequent breastfeeding with cues.   ?Reviewed breast massage and hand expression with Mom.  Nice flow of transitional milk observed. ?Engorgement prevention and treatment reviewed. ? ?Mom aware of OP lactation support and encouraged Mom to call prn for concerns. ? ? ?LATCH Score ?Latch: Grasps breast easily, tongue down, lips flanged, rhythmical sucking. ? ?Audible Swallowing: A few with stimulation ? ?Type of Nipple: Everted at rest and after stimulation ? ?Comfort (Breast/Nipple): Soft / non-tender ? ?Hold (Positioning): Assistance needed to correctly position infant at breast and maintain latch. ? ?LATCH Score: 8 ? ? ?Lactation Tools Discussed/Used ?Tools: Pump ?Breast pump type: Manual ?Pump Education: Setup, frequency, and cleaning;Milk Storage ?Reason for Pumping: PRN ? ?Interventions ?Interventions: Breast feeding basics reviewed;Skin to skin;Breast massage;Hand express;Hand pump;Education ? ?Discharge ?Discharge Education: Engorgement and breast care;Warning signs for feeding baby ?Pump: Personal (Spectra and [place] DEBP) ? ?Consult Status ?Consult Status: Complete ?Date: 10/01/21 ?Follow-up type: Call as needed ? ? ? ?Tilda Burrow E ?10/01/2021, 11:50 AM ? ? ? ?

## 2021-10-01 NOTE — Progress Notes (Signed)
Circumcision Consent ? ?Discussed with mom at bedside about circumcision.  ? ?Circumcision is a surgery that removes the skin that covers the tip of the penis, called the "foreskin." Circumcision is usually done when a boy is between 56 and 91 days old, sometimes up to 37-41 weeks old. ? ?The most common reasons boys are circumcised include for cultural/religious beliefs or for parental preference (potentially easier to clean, so baby looks like daddy, etc). ? ?There may be some medical benefits for circumcision:  ? ?Circumcised boys seem to have slightly lower rates of: ?? Urinary tract infections (per the American Academy of Pediatrics an uncircumcised boy has a 1/100 chance of developing a UTI in the first year of life, a circumcised boy at a 07/998 chance of developing a UTI in the first year of life- a 10% reduction) ?? Penis cancer (typically rare- an uncircumcised female has a 1 in 100,000 chance of developing cancer of the penis) ?? Sexually transmitted infection (in endemic areas, including HIV, HPV and Herpes- circumcision does NOT protect against gonorrhea, chlamydia, trachomatis, or syphilis) ?? Phimosis: a condition where that makes retraction of the foreskin over the glans impossible (0.4 per 1000 boys per year or 0.6% of boys are affected by their 15th birthday) ? ?Boys and men who are not circumcised can reduce these extra risks by: ?? Cleaning their penis well ?? Using condoms during sex ? ?What are the risks of circumcision? ? ?As with any surgical procedure, there are risks and complications. In circumcision, complications are rare and usually minor, the most common being: ?? Bleeding- risk is reduced by holding each clamp for 30 seconds prior to a cut being made, and by holding pressure after the procedure is done ?? Infection- the penis is cleaned prior to the procedure, and the procedure is done under sterile technique ?? Damage to the urethra or amputation of the penis ? ?How is circumcision done  in baby boys? ? ?The baby will be placed on a special table and the legs restrained for their safety. Numbing medication is injected into the penis, and the skin is cleansed with betadine to decrease the risk of infection.  ? ?What to expect: ? ?The penis will look red and raw for 5-7 days as it heals. We expect scabbing around where the cut was made, as well as clear-pink fluid and some swelling of the penis right after the procedure. ?If your baby's circumcision starts to bleed or develops pus, please contact your pediatrician immediately. ? ?All questions were answered and mother consented. ? ?Sharon Seller ?Obstetrics Fellow  ?

## 2021-10-03 ENCOUNTER — Telehealth (HOSPITAL_COMMUNITY): Payer: Self-pay | Admitting: Lactation Services

## 2021-10-03 NOTE — Telephone Encounter (Signed)
Oprah called with questions and concerns about how breastfeeding is going.  Baby lost weight at [facility] and has been sleepy on the breast.  Mom is pumping, pumped 12 oz yesterday.  Breasts are full.  Nipples sore. ?Stools are still dark with some yellow seeds.  Baby has had 2 stools since circumcision on 3/5 ? ?Message sent to Clinic for Lactation F/U  Mom to see OP IBCLC today. ?

## 2021-10-04 ENCOUNTER — Encounter (HOSPITAL_COMMUNITY): Payer: Self-pay | Admitting: Obstetrics and Gynecology

## 2021-10-04 ENCOUNTER — Inpatient Hospital Stay (HOSPITAL_COMMUNITY)
Admission: AD | Admit: 2021-10-04 | Discharge: 2021-10-04 | Disposition: A | Discharge status: Home / Self Care | Payer: Commercial Managed Care - PPO | Attending: Obstetrics and Gynecology | Admitting: Obstetrics and Gynecology

## 2021-10-04 ENCOUNTER — Other Ambulatory Visit: Payer: Self-pay

## 2021-10-04 ENCOUNTER — Telehealth: Payer: Self-pay | Admitting: *Deleted

## 2021-10-04 DIAGNOSIS — I10 Essential (primary) hypertension: Secondary | ICD-10-CM

## 2021-10-04 DIAGNOSIS — O9089 Other complications of the puerperium, not elsewhere classified: Secondary | ICD-10-CM | POA: Diagnosis not present

## 2021-10-04 DIAGNOSIS — O1003 Pre-existing essential hypertension complicating the puerperium: Secondary | ICD-10-CM | POA: Diagnosis not present

## 2021-10-04 DIAGNOSIS — R519 Headache, unspecified: Secondary | ICD-10-CM | POA: Diagnosis not present

## 2021-10-04 DIAGNOSIS — R030 Elevated blood-pressure reading, without diagnosis of hypertension: Secondary | ICD-10-CM | POA: Diagnosis present

## 2021-10-04 LAB — CBC
HCT: 40.5 % (ref 36.0–46.0)
Hemoglobin: 13.7 g/dL (ref 12.0–15.0)
MCH: 27.3 pg (ref 26.0–34.0)
MCHC: 33.8 g/dL (ref 30.0–36.0)
MCV: 80.8 fL (ref 80.0–100.0)
Platelets: 314 10*3/uL (ref 150–400)
RBC: 5.01 MIL/uL (ref 3.87–5.11)
RDW: 13.4 % (ref 11.5–15.5)
WBC: 10.4 10*3/uL (ref 4.0–10.5)
nRBC: 0 % (ref 0.0–0.2)

## 2021-10-04 LAB — COMPREHENSIVE METABOLIC PANEL
ALT: 25 U/L (ref 0–44)
AST: 27 U/L (ref 15–41)
Albumin: 3.4 g/dL — ABNORMAL LOW (ref 3.5–5.0)
Alkaline Phosphatase: 94 U/L (ref 38–126)
Anion gap: 12 (ref 5–15)
BUN: 18 mg/dL (ref 6–20)
CO2: 24 mmol/L (ref 22–32)
Calcium: 9.3 mg/dL (ref 8.9–10.3)
Chloride: 102 mmol/L (ref 98–111)
Creatinine, Ser: 0.69 mg/dL (ref 0.44–1.00)
GFR, Estimated: >60 mL/min (ref >60)
Glucose, Bld: 78 mg/dL (ref 70–99)
Potassium: 3.5 mmol/L (ref 3.5–5.1)
Sodium: 138 mmol/L (ref 135–145)
Total Bilirubin: 0.5 mg/dL (ref 0.3–1.2)
Total Protein: 6.9 g/dL (ref 6.5–8.1)

## 2021-10-04 MED ORDER — ACETAMINOPHEN 500 MG PO TABS
1000.0000 mg | ORAL_TABLET | Freq: Once | ORAL | Status: AC
  Administered 2021-10-04: 1000 mg via ORAL
  Filled 2021-10-04: qty 2

## 2021-10-04 NOTE — Telephone Encounter (Signed)
Received a message from call a nurse after lunch about pt's BP being 160/112.  Pt does have a HA and has been taking Ibuprofen  every 6 hours since yesterday.  She is s/p delivery 09/29/21.  When I called the patient she was currently being admitted to MAU for evaluation. ?

## 2021-10-04 NOTE — MAU Note (Signed)
Sarah Gonzales is a 31 y.o. here in MAU reporting: s/p SVD on 3/3. She called the OB office because her BP was high at home. It was 155/112 (approximately). Has CHTN and is on amlodipine, lasix, and propanolol. Headache started this AM. No visual changes or RUQ pain. ? ?Onset of complaint: today ? ?Pain score: 3/10 ? ?Vitals:  ? 10/04/21 1301  ?BP: (!) 145/97  ?Pulse: 77  ?Resp: 20  ?Temp: 98.2 ?F (36.8 ?C)  ?SpO2: 100%  ?   ?Lab orders placed from triage: none ? ?

## 2021-10-04 NOTE — MAU Provider Note (Signed)
?History  ?  ? ?CSN: [id number] ? ?Arrival date and time: 10/04/21 1248 ? ? None  ?  ?Chief Complaint  ?Patient presents with  ? Hypertension  ? Headache  ? ?HPI ?Sarah Gonzales is a 31 y.o. G1P1 s/p SVD on 09/29/2021 who presents to MAU for elevated blood pressure. Patient reports she took her BP at home 3 times, ranging between 150s-160s/110s. She reports a mild headache over her left eye since this morning that she rates 3/10. She did take 650mg  of Tylenol this morning and had Ibuprofen prior to arrival in MAU. She denies vision changes or RUQ/epigastric pain. She reports that since delivery she has not been sleeping well, nor has she been eating as much as she should. Pregnancy was complicated by cHTN, migraines, and anxiety/depression. She is currently on Propanolol, Amlodipine, and Lasix.   ? ?OB History   ? ? Gravida  ?1  ? Para  ?1  ? Term  ?1  ? Preterm  ?0  ? AB  ?0  ? Living  ?1  ?  ? ? SAB  ?0  ? IAB  ?0  ? Ectopic  ?0  ? Multiple  ?0  ? Live Births  ?1  ?   ?  ?  ? ? ?Past Medical History:  ?Diagnosis Date  ? Anxiety   ? Bartholin cyst   ? Chronic headaches   ? Since the age of 70, severe in the past.  ? Hypertension   ? Warts   ? ? ?History reviewed. No pertinent surgical history. ? ?Family History  ?Problem Relation Age of Onset  ? Hypertension Mother   ? Hypertension Father   ? Sleep apnea Father   ? Asthma Other   ?     aunt  ? Bipolar disorder Maternal Aunt   ? Hypertension Maternal Grandfather   ? Lung cancer Maternal Grandfather   ? Hypertension Maternal Grandmother   ? Hypertension Paternal Grandfather   ? Hypertension Paternal Grandmother   ? ? ?Social History  ? ?Tobacco Use  ? Smoking status: Never  ? Smokeless tobacco: Never  ?Vaping Use  ? Vaping Use: Never used  ?Substance Use Topics  ? Alcohol use: Not Currently  ?  Alcohol/week: 2.0 standard drinks  ?  Types: 2 Standard drinks or equivalent per week  ?  Comment: not while preg  ? Drug use: No  ? ? ?Allergies: No Known Allergies ? ?Medications  Prior to Admission  ?Medication Sig Dispense Refill Last Dose  ? acetaminophen (TYLENOL) 325 MG tablet Take 2 tablets (650 mg total) by mouth every 4 (four) hours as needed (for pain scale < 4). 30 tablet 0 10/04/2021  ? amLODipine (NORVASC) 5 MG tablet Take 1 tablet (5 mg total) by mouth daily. 30 tablet 0 10/04/2021  ? furosemide (LASIX) 20 MG tablet Take 1 tablet (20 mg total) by mouth daily for 5 days. 5 tablet 0 10/04/2021  ? ibuprofen (ADVIL) 600 MG tablet Take 1 tablet (600 mg total) by mouth every 6 (six) hours. 30 tablet 0 10/04/2021  ? Prenatal Vit-Fe Fumarate-FA (MULTIVITAMIN-PRENATAL) 27-0.8 MG TABS tablet Take 1 tablet by mouth daily at 12 noon.   10/04/2021  ? propranolol ER (INDERAL LA) 60 MG 24 hr capsule Take 1 capsule by mouth once daily (Patient taking differently: 60 mg at bedtime.) 90 capsule 0 10/03/2021  ? FLUoxetine (PROZAC) 20 MG capsule Take 2 capsules by mouth once daily (Patient taking differently: Take 40 mg by mouth daily.)  60 capsule 5   ? ?Review of Systems  ?Constitutional: Negative.   ?Respiratory: Negative.    ?Cardiovascular: Negative.   ?Gastrointestinal: Negative.   ?Genitourinary: Negative.   ?Musculoskeletal: Negative.   ?Neurological:  Positive for headaches.  ? ?Physical Exam  ? ?Patient Vitals for the past 24 hrs: ? BP Temp Temp src Pulse Resp SpO2 Height Weight  ?10/04/21 1447 130/81 -- -- 77 -- -- -- --  ?10/04/21 1430 136/86 -- -- 74 -- 99 % -- --  ?10/04/21 1415 (!) 139/91 -- -- 72 -- 99 % -- --  ?10/04/21 1400 (!) 143/96 -- -- 70 -- 99 % -- --  ?10/04/21 1354 (!) 138/104 -- -- 69 -- -- -- --  ?10/04/21 1330 (!) 145/94 -- -- 71 -- 100 % -- --  ?10/04/21 1319 (!) 152/87 -- -- 67 -- -- -- --  ?10/04/21 1301 (!) 145/97 98.2 ?F (36.8 ?C) Oral 77 20 100 % -- --  ?10/04/21 1257 -- -- -- -- -- -- 5\' 6"  (1.676 m) 94.8 kg  ? ?Physical Exam ?Vitals and nursing note reviewed.  ?Constitutional:   ?   General: She is not in acute distress. ?Eyes:  ?   Extraocular Movements: Extraocular  movements intact.  ?   Pupils: Pupils are equal, round, and reactive to light.  ?Cardiovascular:  ?   Rate and Rhythm: Normal rate.  ?Pulmonary:  ?   Effort: Pulmonary effort is normal.  ?Musculoskeletal:     ?   General: Normal range of motion.  ?   Cervical back: Normal range of motion.  ?Skin: ?   General: Skin is warm and dry.  ?Neurological:  ?   Mental Status: She is alert and oriented to person, place, and time.  ?Psychiatric:     ?   Mood and Affect: Mood normal.     ?   Speech: Speech normal.     ?   Behavior: Behavior normal.  ? ? ?MAU Course  ?Procedures ?Serial BP's ?CBC, CMP ?Tylenol 1000mg  PO ? ?MDM ?BP's normotensive to mild range. Labs unremarkable and wnl. Headache improved after Tylenol and eating meal. D/w patient importance of self care and allowing for support system to help with newborn/house chores. Recommend at least 4 hours of uninterrupted sleep and frequent meals/snacks. Continue taking medications as prescribed. Patient has follow up appointment on Monday and instructed to keep appointment. ? ?Assessment and Plan  ?Postpartum care ?Chronic hypertension ?Headache ? ?- Discharge home in stable condition ?- Strict precautions reviewed at length with patient and significant other ?- Keep appointment as scheduled on Monday ?- Return to MAU sooner or as needed for worsening symptoms ? ? ?Thursday, CNM ?10/04/2021, 2:39 PM  ?

## 2021-10-05 ENCOUNTER — Encounter: Payer: Commercial Managed Care - PPO | Admitting: Obstetrics and Gynecology

## 2021-10-09 ENCOUNTER — Other Ambulatory Visit: Payer: Self-pay

## 2021-10-09 ENCOUNTER — Ambulatory Visit (INDEPENDENT_AMBULATORY_CARE_PROVIDER_SITE_OTHER): Payer: Commercial Managed Care - PPO | Admitting: Family Medicine

## 2021-10-09 ENCOUNTER — Encounter: Payer: Self-pay | Admitting: Family Medicine

## 2021-10-09 DIAGNOSIS — F3341 Major depressive disorder, recurrent, in partial remission: Secondary | ICD-10-CM

## 2021-10-09 DIAGNOSIS — I10 Essential (primary) hypertension: Secondary | ICD-10-CM | POA: Diagnosis not present

## 2021-10-09 NOTE — Progress Notes (Signed)
First BP: 137/91 ?Repeat BP: 138/87 ?Pt denies headache ?

## 2021-10-09 NOTE — Assessment & Plan Note (Signed)
Continue Amlodipine and propranolol. BP is well controlled today ?

## 2021-10-09 NOTE — Assessment & Plan Note (Signed)
On Prozac and doing well.--continue 40 mg daily ?

## 2021-10-09 NOTE — Progress Notes (Signed)
? ?  Subjective:  ? ? Patient ID: Sarah Gonzales is a 31 y.o. female presenting with Blood Pressure Check ? on 10/09/2021 ? ?HPI: ?Patient is PPD# 10 from SVD. Has h/o chronic HTN. Seen in MAU with elevated BP and noted to be mild. Had a migraine for a few days and now this is resolved. ?She is s/p lasix and on Amlodipine and has Propranolol. Weight is down x 26 pounds. She is nursing. Her mood is good following a brief meltdaown in her first few days at home. ? ?Review of Systems  ?Constitutional:  Negative for chills and fever.  ?Respiratory:  Negative for shortness of breath.   ?Cardiovascular:  Negative for chest pain.  ?Gastrointestinal:  Negative for abdominal pain, nausea and vomiting.  ?Genitourinary:  Negative for dysuria.  ?Skin:  Negative for rash.  ?Neurological:  Positive for headaches (none x last 2 days).  ?   ?Objective:  ?  ?BP (!) 137/91   Pulse 67   Ht 5\' 5"  (1.651 m)   Wt 205 lb (93 kg)   BMI 34.11 kg/m?  ?Physical Exam ?Constitutional:   ?   General: She is not in acute distress. ?   Appearance: She is well-developed.  ?HENT:  ?   Head: Normocephalic and atraumatic.  ?Eyes:  ?   General: No scleral icterus. ?Cardiovascular:  ?   Rate and Rhythm: Normal rate.  ?Pulmonary:  ?   Effort: Pulmonary effort is normal.  ?Abdominal:  ?   Palpations: Abdomen is soft.  ?Musculoskeletal:  ?   Cervical back: Neck supple.  ?Skin: ?   General: Skin is warm and dry.  ?Neurological:  ?   Mental Status: She is alert and oriented to person, place, and time.  ? ? ? ?   ?Assessment & Plan:  ? ?Problem List Items Addressed This Visit   ? ?  ? Unprioritized  ? Benign essential hypertension  ?  Continue Amlodipine and propranolol. BP is well controlled today ?  ?  ? Major depression, recurrent (HCC)  ?  On Prozac and doing well.--continue 40 mg daily ?  ?  ? ? ? ?Return in about 4 weeks (around 11/06/2021) for pp check. ? ?01/06/2022, MD ?10/09/2021 ?4:46 PM ? ? ? ? ?

## 2021-10-12 ENCOUNTER — Inpatient Hospital Stay (HOSPITAL_COMMUNITY): Admit: 2021-10-12 | Payer: Commercial Managed Care - PPO | Admitting: Family Medicine

## 2021-10-18 ENCOUNTER — Other Ambulatory Visit: Payer: Self-pay | Admitting: Obstetrics & Gynecology

## 2021-10-18 ENCOUNTER — Encounter: Payer: Self-pay | Admitting: Family Medicine

## 2021-10-18 ENCOUNTER — Other Ambulatory Visit: Payer: Self-pay | Admitting: *Deleted

## 2021-10-18 MED ORDER — AMLODIPINE BESYLATE 5 MG PO TABS: 10.0000 mg | ORAL_TABLET | Freq: Every day | ORAL | 0 refills | Status: DC

## 2021-10-31 ENCOUNTER — Other Ambulatory Visit: Payer: Self-pay | Admitting: Family Medicine

## 2021-10-31 MED ORDER — PROPRANOLOL HCL ER 60 MG PO CP24: 60.0000 mg | ORAL_CAPSULE | Freq: Every day | ORAL | 0 refills | Status: DC

## 2021-11-10 ENCOUNTER — Encounter: Payer: Self-pay | Admitting: Sports Medicine

## 2021-11-10 ENCOUNTER — Ambulatory Visit (INDEPENDENT_AMBULATORY_CARE_PROVIDER_SITE_OTHER): Payer: Commercial Managed Care - PPO

## 2021-11-10 ENCOUNTER — Encounter: Payer: Self-pay | Admitting: Women's Health

## 2021-11-10 ENCOUNTER — Ambulatory Visit (INDEPENDENT_AMBULATORY_CARE_PROVIDER_SITE_OTHER): Payer: Commercial Managed Care - PPO | Admitting: Family Medicine

## 2021-11-10 ENCOUNTER — Ambulatory Visit (INDEPENDENT_AMBULATORY_CARE_PROVIDER_SITE_OTHER): Payer: Commercial Managed Care - PPO | Admitting: Women's Health

## 2021-11-10 ENCOUNTER — Ambulatory Visit: Payer: Commercial Managed Care - PPO | Admitting: Sports Medicine

## 2021-11-10 ENCOUNTER — Encounter: Payer: Self-pay | Admitting: Family Medicine

## 2021-11-10 VITALS — BP 135/73 | HR 69 | Resp 18 | Ht 65.0 in | Wt 209.0 lb

## 2021-11-10 VITALS — BP 125/80 | HR 63 | Ht 65.0 in | Wt 210.0 lb

## 2021-11-10 DIAGNOSIS — M654 Radial styloid tenosynovitis [de Quervain]: Secondary | ICD-10-CM

## 2021-11-10 DIAGNOSIS — M51369 Other intervertebral disc degeneration, lumbar region without mention of lumbar back pain or lower extremity pain: Secondary | ICD-10-CM

## 2021-11-10 DIAGNOSIS — M5416 Radiculopathy, lumbar region: Secondary | ICD-10-CM | POA: Insufficient documentation

## 2021-11-10 DIAGNOSIS — Z3042 Encounter for surveillance of injectable contraceptive: Secondary | ICD-10-CM

## 2021-11-10 DIAGNOSIS — K625 Hemorrhage of anus and rectum: Secondary | ICD-10-CM

## 2021-11-10 DIAGNOSIS — K6289 Other specified diseases of anus and rectum: Secondary | ICD-10-CM | POA: Diagnosis not present

## 2021-11-10 DIAGNOSIS — N898 Other specified noninflammatory disorders of vagina: Secondary | ICD-10-CM | POA: Diagnosis not present

## 2021-11-10 DIAGNOSIS — M545 Low back pain, unspecified: Secondary | ICD-10-CM | POA: Diagnosis not present

## 2021-11-10 DIAGNOSIS — M5136 Other intervertebral disc degeneration, lumbar region: Secondary | ICD-10-CM | POA: Insufficient documentation

## 2021-11-10 DIAGNOSIS — R200 Anesthesia of skin: Secondary | ICD-10-CM

## 2021-11-10 DIAGNOSIS — G8929 Other chronic pain: Secondary | ICD-10-CM

## 2021-11-10 HISTORY — DX: Other intervertebral disc degeneration, lumbar region without mention of lumbar back pain or lower extremity pain: M51.369

## 2021-11-10 HISTORY — DX: Radial styloid tenosynovitis (de quervain): M65.4

## 2021-11-10 LAB — POCT URINE PREGNANCY: Preg Test, Ur: NEGATIVE

## 2021-11-10 MED ORDER — LIDOCAINE 5 % EX OINT
1.0000 | TOPICAL_OINTMENT | CUTANEOUS | 0 refills | Status: DC | PRN
Start: 2021-11-10 — End: 2022-01-19

## 2021-11-10 MED ORDER — MEDROXYPROGESTERONE ACETATE 150 MG/ML IM SUSP
150.0000 mg | Freq: Once | INTRAMUSCULAR | Status: AC
  Administered 2021-11-10: 150 mg via INTRAMUSCULAR

## 2021-11-10 MED ORDER — HYDROCORTISONE ACETATE 25 MG RE SUPP: 25.0000 mg | Freq: Two times a day (BID) | RECTAL | 1 refills | Status: DC

## 2021-11-10 MED ORDER — PREDNISONE 50 MG PO TABS: ORAL_TABLET | ORAL | 0 refills | Status: DC

## 2021-11-10 NOTE — Progress Notes (Signed)
? ? ?Post Partum Visit Note ? ?Sarah Gonzales is a 31 y.o. G45P1001 female who presents for a postpartum visit. She is 6 weeks postpartum following a normal spontaneous vaginal delivery.  I have fully reviewed the prenatal and intrapartum course. The delivery was at 38.1 gestational weeks.  Anesthesia: epidural. Postpartum course has been unremarkable. Baby is doing well. Baby is feeding by breast. Bleeding staining only. Bowel function is normal. Bladder function is normal. Patient is not sexually active. Contraception method is IUD. Postpartum depression screening: negative. ? ?The pregnancy intention screening data noted above was reviewed. Potential methods of contraception were discussed. The patient elected to proceed with No data recorded. ? ? Edinburgh Postnatal Depression Scale - 11/10/21 0956   ? ?  ? Edinburgh Postnatal Depression Scale:  In the Past 7 Days  ? I have been able to laugh and see the funny side of things. 0   ? I have looked forward with enjoyment to things. 0   ? I have blamed myself unnecessarily when things went wrong. 1   ? I have been anxious or worried for no good reason. 1   ? I have felt scared or panicky for no good reason. 0   ? Things have been getting on top of me. 1   ? I have been so unhappy that I have had difficulty sleeping. 0   ? I have felt sad or miserable. 0   ? I have been so unhappy that I have been crying. 0   ? The thought of harming myself has occurred to me. 0   ? Edinburgh Postnatal Depression Scale Total 3   ? ?  ?  ? ?  ? ? ?Health Maintenance Due  ?Topic Date Due  ? COVID-19 Vaccine (3 - Booster for Pfizer series) 05/09/2020  ? ? ?The following portions of the patient's history were reviewed and updated as appropriate: allergies, current medications, past family history, past medical history, past social history, past surgical history, and problem list. ? ?Review of Systems ?Pertinent items noted in HPI and remainder of comprehensive ROS otherwise  negative. ? ?Objective:  ?BP 125/80   Pulse 63   Ht 5\' 5"  (1.651 m)   Wt 210 lb (95.3 kg)   Breastfeeding Yes   BMI 34.95 kg/m?   ? ?General:  alert, cooperative, and no distress  ? Breasts:  not indicated  ?Lungs: clear to auscultation bilaterally  ?Heart:  regular rate and rhythm, S1, S2 normal, no murmur, click, rub or gallop  ?Abdomen: soft, non-tender; bowel sounds normal; no masses,  no organomegaly   ?Wound N/a  ?GU exam:  normal. External rectum examined, no hemorrhoids noted. Small amount of blood noted between gluteal folds. Patient notes significant discomfort during exam.  ?     ?Assessment:  ? ?1. Depo-Provera contraceptive status ?- POCT urine pregnancy ?- medroxyPROGESTERone (DEPO-PROVERA) injection 150 mg ? ?2. Postpartum exam ? ?3. Rectal pain ?-pt reports development of hemorrhoids about 3 weeks after delivery. Patient reports she can not have a bowel movement without sobbing. Patient has been taking Miralax QOD to help, but the pain is persisting. Patient reports bowel movements also QOD, which has been normal for her since becoming pregnant, but prior to pregnancy is was daily in the morning. Patient denies constipation, denies straining with bowel movements, reports normal stool, but reports significant pain with bowel movements. Patient reports blood in toilet after bowel movements. Patient reports she has a PCP And has an appointment  at 11:30AM today. ? ?4. Rectal bleeding ? ?Normal postpartum exam, rectal bleeding and pain to be further evaluated by PCP. Due to perineal pain, pt decides against IUD today and opts for Depo-Provera for short-term use, pending .  ? ?Plan:  ? ?Essential components of care per ACOG recommendations: ? ?1.  Mood and well being: Patient with negative depression screening today. Reviewed local resources for support.  ?- Patient tobacco use? No.   ?- hx of drug use? No.   ? ?2. Infant care and feeding:  ?-Patient currently breastmilk feeding? Yes. Discussed  returning to work and pumping. Reviewed importance of draining breast regularly to support lactation.  ?-Social determinants of health (SDOH) reviewed in EPIC. No concerns. ? ?3. Sexuality, contraception and birth spacing ?- Patient does not want a pregnancy in the next year.  Desired family size is 1 children.  ?- Reviewed reproductive life planning. Reviewed contraceptive methods based on pt preferences and effectiveness.  Patient desired Hormonal Injection today.   ?- Discussed birth spacing of 18 months ? ?-NKDA ?-Propranolol, Fluoxetine, PNV, Amlodipine ?-PMH: HTN, anxiety, migraines ? ?4. Sleep and fatigue ?-Encouraged family/partner/community support of 4 hrs of uninterrupted sleep to help with mood and fatigue ? ?5. Physical Recovery  ?- Discussed patients delivery and complications. She describes her labor as good. ?- Patient had a Vaginal, no problems at delivery. Patient had a  bilateral labial  laceration. Perineal healing reviewed. Patient expressed understanding ?- Patient has urinary incontinence? No. ?- Patient is safe to resume physical and sexual activity s/p 7days of Depo. ? ?6.  Health Maintenance ?- HM due items addressed Yes ?- Last pap smear  ?Diagnosis  ?Date Value Ref Range Status  ?07/18/2020   Final  ? - Negative for intraepithelial lesion or malignancy (NILM)  ? Pap smear not done at today's visit.  ?-Breast Cancer screening indicated? No.  ? ?7. Chronic Disease/Pregnancy Condition follow up: Hypertension ? ?- PCP follow up ? ?Marylen Ponto, NP ?[facility] for [employer], [facility]  ?

## 2021-11-10 NOTE — Assessment & Plan Note (Signed)
Painful rectal bleeding, apparently there was an exam performed by gynecology, no obvious hemorrhoids seen, symptoms are consistent more with a thrombosed external hemorrhoid versus a rectal fissure, internal hemorrhoids are typically asymptomatic from a pain standpoint. ?She will need to follow-up with her PCP for this. ?

## 2021-11-10 NOTE — Progress Notes (Signed)
? ?Acute Office Visit ? ?Subjective:  ? ? Patient ID: Sarah Gonzales, female    DOB: [birth year], 31 y.o.   MRN: [id number] ? ?Chief Complaint  ?Patient presents with  ? Rectal Bleeding  ?  Patient states she has severe pain and rectal bleeding when having a bowel movement for 2 weeks. Patient states she takes Miralax every other day.   ? Hypertension  ?  Follow up. Patient would like to know if she should continue on Amlodipine?  ? ? ?HPI ?Patient is in today for rectal pain with bowel movement that is been going on for about a week and a half.  She is also noticing a lot of blood with bowel movement she is not sure if it is in the stool but is definitely on outside of the stool and when she wipes.  She says the BM is so painful she cries. Says it is worse than a contraction.  She says she still having normal bowel movement so she did start taking some MiraLAX just to try to make them softer to make it more comfortable.  Her last bowel movement was around 830 this morning.  She says before this started she was not having any issues with constipation or straining or hard stools.  No fevers chills or sweats.  No abdominal pain.  No other GI symptoms. ? ?Past Medical History:  ?Diagnosis Date  ? Anxiety   ? Bartholin cyst   ? Chronic headaches   ? Since the age of 70, severe in the past.  ? Hypertension   ? Warts   ? ? ?No past surgical history on file. ? ?Family History  ?Problem Relation Age of Onset  ? Hypertension Mother   ? Hypertension Father   ? Sleep apnea Father   ? Asthma Other   ?     aunt  ? Bipolar disorder Maternal Aunt   ? Hypertension Maternal Grandfather   ? Lung cancer Maternal Grandfather   ? Hypertension Maternal Grandmother   ? Hypertension Paternal Grandfather   ? Hypertension Paternal Grandmother   ? ? ?Social History  ? ?Socioeconomic History  ? Marital status: Married  ?  Spouse name: Not on file  ? Number of children: Not on file  ? Years of education: Not on file  ? Highest education level: Not  on file  ?Occupational History  ? Occupation: server  ?Tobacco Use  ? Smoking status: Never  ? Smokeless tobacco: Never  ?Vaping Use  ? Vaping Use: Never used  ?Substance and Sexual Activity  ? Alcohol use: Not Currently  ?  Alcohol/week: 2.0 standard drinks  ?  Types: 2 Standard drinks or equivalent per week  ?  Comment: not while preg  ? Drug use: No  ? Sexual activity: Not Currently  ?  Partners: Male  ?  Comment: lives with family, student at [employer], involved in [place]  ?Other Topics Concern  ? Not on file  ?Social History Narrative  ? Not on file  ? ?Social Determinants of Health  ? ?Financial Resource Strain: Not on file  ?Food Insecurity: Not on file  ?Transportation Needs: Not on file  ?Physical Activity: Not on file  ?Stress: Not on file  ?Social Connections: Not on file  ?Intimate Partner Violence: Not on file  ? ? ?Outpatient Medications Prior to Visit  ?Medication Sig Dispense Refill  ? acetaminophen (TYLENOL) 325 MG tablet Take 2 tablets (650 mg total) by mouth every 4 (four) hours as needed (  for pain scale < 4). 30 tablet 0  ? amLODipine (NORVASC) 5 MG tablet Take 2 tablets (10 mg total) by mouth daily. 60 tablet 0  ? FLUoxetine (PROZAC) 20 MG capsule Take 2 capsules by mouth once daily (Patient taking differently: Take 40 mg by mouth daily.) 60 capsule 5  ? ibuprofen (ADVIL) 600 MG tablet Take 1 tablet (600 mg total) by mouth every 6 (six) hours. 30 tablet 0  ? predniSONE (DELTASONE) 50 MG tablet One tab PO daily for 5 days. 5 tablet 0  ? Prenatal Vit-Fe Fumarate-FA (MULTIVITAMIN-PRENATAL) 27-0.8 MG TABS tablet Take 1 tablet by mouth daily at 12 noon.    ? propranolol ER (INDERAL LA) 60 MG 24 hr capsule Take 1 capsule (60 mg total) by mouth daily. 90 capsule 0  ? ?No facility-administered medications prior to visit.  ? ? ?No Known Allergies ? ?Review of Systems ? ?   ?Objective:  ?  ?Physical Exam ?Vitals reviewed. Exam conducted with a chaperone present.  ?Constitutional:   ?   Appearance: She is  well-developed.  ?HENT:  ?   Head: Normocephalic and atraumatic.  ?Eyes:  ?   Conjunctiva/sclera: Conjunctivae normal.  ?Cardiovascular:  ?   Rate and Rhythm: Normal rate.  ?Pulmonary:  ?   Effort: Pulmonary effort is normal.  ?Genitourinary: ?   Rectum: Guaiac result positive. Tenderness present. No mass or external hemorrhoid. Normal anal tone.  ?   Comments: Digital exam was no mass or induration but very tender.  Started to place the anoscope but was only able to insert about 2 inches.  No visible findings except BRB.  ?Skin: ?   General: Skin is dry.  ?   Coloration: Skin is not pale.  ?Neurological:  ?   Mental Status: She is alert and oriented to person, place, and time.  ?Psychiatric:     ?   Behavior: Behavior normal.  ? ? ?BP 135/73   Pulse 69   Resp 18   Ht 5' 5"  (1.651 m)   Wt 209 lb (94.8 kg)   SpO2 96%   Breastfeeding Yes   BMI 34.78 kg/m?  ?Wt Readings from Last 3 Encounters:  ?11/10/21 209 lb (94.8 kg)  ?11/10/21 210 lb (95.3 kg)  ?10/09/21 205 lb (93 kg)  ? ? ?Health Maintenance Due  ?Topic Date Due  ? COVID-19 Vaccine (3 - Booster for Pfizer series) 05/09/2020  ? ? ?There are no preventive care reminders to display for this patient. ? ? ?Lab Results  ?Component Value Date  ? TSH 3.48 12/05/2020  ? ?Lab Results  ?Component Value Date  ? WBC 10.4 10/04/2021  ? HGB 13.7 10/04/2021  ? HCT 40.5 10/04/2021  ? MCV 80.8 10/04/2021  ? PLT 314 10/04/2021  ? ?Lab Results  ?Component Value Date  ? NA 138 10/04/2021  ? K 3.5 10/04/2021  ? CO2 24 10/04/2021  ? GLUCOSE 78 10/04/2021  ? BUN 18 10/04/2021  ? CREATININE 0.69 10/04/2021  ? BILITOT 0.5 10/04/2021  ? ALKPHOS 94 10/04/2021  ? AST 27 10/04/2021  ? ALT 25 10/04/2021  ? PROT 6.9 10/04/2021  ? ALBUMIN 3.4 (L) 10/04/2021  ? CALCIUM 9.3 10/04/2021  ? ANIONGAP 12 10/04/2021  ? EGFR 129 03/14/2021  ? ?Lab Results  ?Component Value Date  ? CHOL 190 02/28/2021  ? ?Lab Results  ?Component Value Date  ? HDL 45 (L) 02/28/2021  ? ?Lab Results  ?Component Value  Date  ? LDLCALC 116 (H) 02/28/2021  ? ?  Lab Results  ?Component Value Date  ? TRIG 169 (H) 02/28/2021  ? ?Lab Results  ?Component Value Date  ? CHOLHDL 4.2 02/28/2021  ? ?Lab Results  ?Component Value Date  ? HGBA1C 4.9 03/14/2021  ? ? ?   ?Assessment & Plan:  ? ?Problem List Items Addressed This Visit   ? ?  ? Digestive  ? Rectal bleeding  ? Relevant Orders  ? Ambulatory referral to Gastroenterology  ? ?Other Visit Diagnoses   ? ? Rectal pain    -  Primary  ? Relevant Orders  ? Ambulatory referral to Gastroenterology  ? Vaginal itching      ? Relevant Orders  ? WET PREP FOR [place], YEAST, CLUE  ? ?  ? ?Rectal pain and bleeding-fissure versus possible internal thrombosed hemorrhoid.  I was unable to fully insert the anoscope we got part way and she did have fresh blood present.  Continue with MiraLAX keep the stool soft.  We will add hydrocortisone steroid suppositories for comfort relief and to reduce swelling.  Also given a prescription for lidocaine gel to use to help numb the area and make it more comfortable. ? ?Meds ordered this encounter  ?Medications  ? hydrocortisone (ANUSOL-HC) 25 MG suppository  ?  Sig: Place 1 suppository (25 mg total) rectally 2 (two) times daily.  ?  Dispense:  24 suppository  ?  Refill:  1  ? lidocaine (XYLOCAINE) 5 % ointment  ?  Sig: Apply 1 application. topically as needed. To rectal area  ?  Dispense:  35.44 g  ?  Refill:  0  ? ? ? ?Beatrice Lecher, MD ? ?

## 2021-11-10 NOTE — Progress Notes (Signed)
Pt is having issues with hemorrhoids. Pt notices a significant amount of blood in the toilet after each bowel movement.  ?

## 2021-11-10 NOTE — Progress Notes (Signed)
? ? ?  Procedures performed today:   ? ?None. ? ?Independent interpretation of notes and tests performed by another provider:  ? ?None. ? ?Brief History, Exam, Impression, and Recommendations:   ? ?Numbness of right great toe ?Pleasant 31 year old female, she is recently postpartum, she has had a long history of axial low back pain, she had seen a chiropractor who diagnosed her with piriformis syndrome. ?More recently she has had numbness and tingling right great toe. ?She has a minimally positive tarsal tunnel Tinel's sign, but I do think the majority of her symptomatology is radicular. ?I also explained to her how we tend overdiagnose piriformis syndrome, and a lot of the time symptoms are coming from the lumbar spine. 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 ?For the potential tarsal tunnel component I would like her to get some custom molded orthotics with Dr. Jordan Likes. ?Avoid barefoot walking at home. ?Return to see me in 6 weeks, no conduction/EMG versus MRI of the lumbar spine for interventional planning if not better. ? ?De Quervain's tenosynovitis, bilateral ?Also having bilateral radial sided wrist pain, right worse than left, right side does have a ganglion cyst in approximation with the first extensor compartment, she has a positive Finkelstein sign bilaterally. ?Tommi Rumps Quervain's tendinitis is very common peripartum, prednisone will help, adding de Quervain's exercises, return to see me in 6 weeks, injections if not better. ? ?Rectal bleeding ?Painful rectal bleeding, apparently there was an exam performed by gynecology, no obvious hemorrhoids seen, symptoms are consistent more with a thrombosed external hemorrhoid versus a rectal fissure, internal hemorrhoids are typically asymptomatic from a pain standpoint. ?She  will need to follow-up with her PCP for this. ? ? ? ?___________________________________________ ?Ihor Austin. Benjamin Stain, M.D., ABFM., CAQSM. ?Primary Care and Sports Medicine ?[facility] ? ?Adjunct Instructor of Family Medicine  ?[employer] ?

## 2021-11-10 NOTE — Assessment & Plan Note (Signed)
Pleasant 31 year old female, she is recently postpartum, she has had a long history of axial low back pain, she had seen a chiropractor who diagnosed her with piriformis syndrome. ?More recently she has had numbness and tingling right great toe. ?She has a minimally positive tarsal tunnel Tinel's sign, but I do think the majority of her symptomatology is radicular. ?I also explained to her how we tend overdiagnose piriformis syndrome, and a lot of the time symptoms are coming from the lumbar spine. 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 ?For the potential tarsal tunnel component I would like her to get some custom molded orthotics with Dr. Jordan Likes. ?Avoid barefoot walking at home. ?Return to see me in 6 weeks, no conduction/EMG versus MRI of the lumbar spine for interventional planning if not better. ?

## 2021-11-10 NOTE — Assessment & Plan Note (Signed)
Also having bilateral radial sided wrist pain, right worse than left, right side does have a ganglion cyst in approximation with the first extensor compartment, she has a positive Finkelstein sign bilaterally. ?Tennis Must Quervain's tendinitis is very common peripartum, prednisone will help, adding de Quervain's exercises, return to see me in 6 weeks, injections if not better. ?

## 2021-11-10 NOTE — Patient Instructions (Signed)
Keep doing the MiraLAX I think it will at least help keep the stools soft so it is passing a little bit more easily. ?

## 2021-11-11 LAB — WET PREP FOR TRICH, YEAST, CLUE
MICRO NUMBER:: 13265505
Specimen Quality: ADEQUATE

## 2021-11-13 ENCOUNTER — Encounter: Payer: Self-pay | Admitting: Family Medicine

## 2021-11-13 MED ORDER — METRONIDAZOLE 500 MG PO TABS: 500.0000 mg | ORAL_TABLET | Freq: Two times a day (BID) | ORAL | 0 refills | Status: DC

## 2021-11-13 NOTE — Progress Notes (Signed)
HI Rosabelle, the wet prep shows bacterial vaginalis.  I will send over an antibiotic to clear this up. No yeast.

## 2021-11-13 NOTE — Addendum Note (Signed)
Addended by: Beatrice Lecher D on: 11/13/2021 07:13 AM ? ? Modules accepted: Orders ? ?

## 2021-11-14 MED ORDER — HYDROCORTISONE (PERIANAL) 2.5 % EX CREA: 1.0000 "application " | TOPICAL_CREAM | Freq: Two times a day (BID) | CUTANEOUS | 0 refills | Status: DC

## 2021-11-14 NOTE — Telephone Encounter (Signed)
Can we call the pharmacy and see what they would recommend since it sounds like the hydrocortisone suppositories are costly.  Maybe if we write a different strength?  But lets just call them and see what they would recommend and I am happy to send over a new prescription. ?

## 2021-11-16 ENCOUNTER — Encounter: Payer: Self-pay | Admitting: Gastroenterology

## 2021-11-16 ENCOUNTER — Ambulatory Visit: Payer: Commercial Managed Care - PPO | Admitting: Gastroenterology

## 2021-11-16 VITALS — BP 123/79 | HR 73 | Temp 98.0°F | Ht 65.0 in | Wt 209.0 lb

## 2021-11-16 DIAGNOSIS — K600 Acute anal fissure: Secondary | ICD-10-CM | POA: Diagnosis not present

## 2021-11-16 NOTE — Progress Notes (Signed)
?  ?Arlyss Repress, MD ?[address]  ?[address]  ?[place], [place] [address]  ?Main: [phone]  ?Fax: [phone] ? ? ? ?Gastroenterology Consultation ? ?Referring Provider:     Agapito Games, * ?Primary Care Physician:  Agapito Games, MD ?Primary Gastroenterologist:  Dr. Arlyss Repress ?Reason for Consultation: Rectal pain ?      ? HPI:   ?Sarah Gonzales is a 31 y.o. female referred by Dr. Agapito Games, MD  for consultation & management of rectal pain.  Patient is 6 weeks postpartum, had normal vaginal delivery.  For last 2 weeks, she has been experiencing severe rectal pain associated with bright red blood per rectum, pressure, discomfort.  She reports that her pregnancy was uneventful, had mild constipation for which she took MiraLAX.  Delivery was uneventful.  She reports having bowel movement every other day currently not associated with any hard stools or pushing.  She does not spend more than 5 to 10 minutes on the toilet.  She reports excruciating pain every time she has a BM.  She has tried sitz bath's in the early postpartum.  She was prescribed hydrocortisone suppositories, could not afford, just started taking hydrocortisone cream.  Patient does not have any other GI symptoms ?No evidence of anemia ?She denies family history of IBD, colon cancer ? ?NSAIDs: None ? ?Antiplts/Anticoagulants/Anti thrombotics: None ? ?GI Procedures: None ? ?Past Medical History:  ?Diagnosis Date  ? Anxiety   ? Bartholin cyst   ? Chronic headaches   ? Since the age of 63, severe in the past.  ? Hypertension   ? Warts   ? ? ?History reviewed. No pertinent surgical history. ? ?Current Outpatient Medications:  ?  acetaminophen (TYLENOL) 325 MG tablet, Take 2 tablets (650 mg total) by mouth every 4 (four) hours as needed (for pain scale < 4)., Disp: 30 tablet, Rfl: 0 ?  amLODipine (NORVASC) 5 MG tablet, Take 2 tablets (10 mg total) by mouth daily., Disp: 60 tablet, Rfl: 0 ?  FLUoxetine (PROZAC) 20  MG capsule, Take 2 capsules by mouth once daily (Patient taking differently: Take 40 mg by mouth daily.), Disp: 60 capsule, Rfl: 5 ?  hydrocortisone (ANUSOL-HC) 2.5 % rectal cream, Place 1 application. rectally 2 (two) times daily., Disp: 30 g, Rfl: 0 ?  ibuprofen (ADVIL) 600 MG tablet, Take 1 tablet (600 mg total) by mouth every 6 (six) hours., Disp: 30 tablet, Rfl: 0 ?  lidocaine (XYLOCAINE) 5 % ointment, Apply 1 application. topically as needed. To rectal area, Disp: 35.44 g, Rfl: 0 ?  metroNIDAZOLE (FLAGYL) 500 MG tablet, Take 1 tablet (500 mg total) by mouth 2 (two) times daily., Disp: 14 tablet, Rfl: 0 ?  Prenatal Vit-Fe Fumarate-FA (MULTIVITAMIN-PRENATAL) 27-0.8 MG TABS tablet, Take 1 tablet by mouth daily at 12 noon., Disp: , Rfl:  ?  propranolol ER (INDERAL LA) 60 MG 24 hr capsule, Take 1 capsule (60 mg total) by mouth daily., Disp: 90 capsule, Rfl: 0 ? ? ? ?Family History  ?Problem Relation Age of Onset  ? Hypertension Mother   ? Hypertension Father   ? Sleep apnea Father   ? Asthma Other   ?     aunt  ? Bipolar disorder Maternal Aunt   ? Hypertension Maternal Grandfather   ? Lung cancer Maternal Grandfather   ? Hypertension Maternal Grandmother   ? Hypertension Paternal Grandfather   ? Hypertension Paternal Grandmother   ?  ? ?Social History  ? ?Tobacco  Use  ? Smoking status: Never  ? Smokeless tobacco: Never  ?Vaping Use  ? Vaping Use: Never used  ?Substance Use Topics  ? Alcohol use: Not Currently  ?  Alcohol/week: 2.0 standard drinks  ?  Types: 2 Standard drinks or equivalent per week  ?  Comment: not while preg  ? Drug use: No  ? ? ?Allergies as of 11/16/2021  ? (No Known Allergies)  ? ? ?Review of Systems:    ?All systems reviewed and negative except where noted in HPI. ? ? Physical Exam:  ?BP 123/79 (BP Location: Left Arm, Patient Position: Sitting, Cuff Size: Normal)   Pulse 73   Temp 98 ?F (36.7 ?C) (Oral)   Ht 5\' 5"  (1.651 m)   Wt 209 lb (94.8 kg)   LMP  (LMP Unknown)   BMI 34.78 kg/m?  ?No  LMP recorded (lmp unknown). (Menstrual status: Other). ? ?General:   Alert,  Well-developed, well-nourished, pleasant and cooperative in NAD ?Head:  Normocephalic and atraumatic. ?Eyes:  Sclera clear, no icterus.   Conjunctiva pink. ?Ears:  Normal auditory acuity. ?Nose:  No deformity, discharge, or lesions. ?Mouth:  No deformity or lesions,oropharynx pink & moist. ?Neck:  Supple; no masses or thyromegaly. ?Lungs:  Respirations even and unlabored.  Clear throughout to auscultation.   No wheezes, crackles, or rhonchi. No acute distress. ?Heart:  Regular rate and rhythm; no murmurs, clicks, rubs, or gallops. ?Abdomen:  Normal bowel sounds. Soft, non-tender and non-distended without masses, hepatosplenomegaly or hernias noted.  No guarding or rebound tenderness.   ?Rectal: Normal perianal skin, point tenderness in the posterior wall of the anal canal and anoscopy was performed which confirmed posterior anal fissure which was bleeding.  The distal rectal mucosa appeared normal ?Msk:  Symmetrical without gross deformities. Good, equal movement & strength bilaterally. ?Pulses:  Normal pulses noted. ?Extremities:  No clubbing or edema.  No cyanosis. ?Neurologic:  Alert and oriented x3;  grossly normal neurologically. ?Skin:  Intact without significant lesions or rashes. No jaundice. ?Psych:  Alert and cooperative. Normal mood and affect. ? ?Imaging Studies: ?No abdominal imaging ? ?Assessment and Plan:  ? ?Sarah Gonzales is a 31 y.o. pleasant Caucasian female 6 weeks postpartum is seen in consultation for 2 weeks history of severe rectal pain associated with bright red blood per rectum.  Rectal exam and anoscopy confirmed acute posterior anal fissure with some bleeding ? ?Discussed about treatment of anal fissure with nitroglycerin ?Recommend 0.125% nitroglycerin with 5% lidocaine, instructions provided ?Recommend sitz bath's daily ?Advised to take MiraLAX daily, adequate amount of fiber to have bowel movement  daily ?Patient will message me via MyChart if symptoms are not improving within a week ? ?Follow up as needed ? ? ?38, MD ? ?

## 2021-11-16 NOTE — Patient Instructions (Signed)
[facility] [address], [place] [address].   Phone number [phone].   Please allow at least 24 hours before picking up the compounded cream because the pharmacy has to make the medication.  

## 2021-12-15 ENCOUNTER — Ambulatory Visit (INDEPENDENT_AMBULATORY_CARE_PROVIDER_SITE_OTHER): Payer: Commercial Managed Care - PPO | Admitting: Sports Medicine

## 2021-12-15 ENCOUNTER — Ambulatory Visit (INDEPENDENT_AMBULATORY_CARE_PROVIDER_SITE_OTHER): Payer: Commercial Managed Care - PPO

## 2021-12-15 DIAGNOSIS — M5416 Radiculopathy, lumbar region: Secondary | ICD-10-CM

## 2021-12-15 DIAGNOSIS — M654 Radial styloid tenosynovitis [de Quervain]: Secondary | ICD-10-CM

## 2021-12-15 NOTE — Assessment & Plan Note (Signed)
Pleasant 31 year old female, postpartum, bilateral radial sided wrist pain, positive Finkelstein sign bilaterally in spite of conservative treatment, today we did bilateral first extensor compartment injections with ultrasound guidance, return to see me in 4 weeks for this.

## 2021-12-15 NOTE — Assessment & Plan Note (Signed)
There is also Pennsaid (diclofenac 2% topical) back pain with radiation down the right leg to the great toe with numbness and tingling, there is a minimally positive tarsal tunnel Tinel's sign but I thought the majority of the symptomatology was radicular. X-rays did show lumbar DDD. At this point she has failed greater than 6 weeks of physician directed conservative treatment, so we will proceed with MRI, she will let us know if she would like an epidural as well afterwards, suspect right L4 nerve root compression.

## 2021-12-15 NOTE — Progress Notes (Signed)
    Procedures performed today:    Procedure: Real-time Ultrasound Guided injection of the left first extensor compartment Device: Samsung HS60  Verbal informed consent obtained.  Time-out conducted.  Noted no overlying erythema, induration, or other signs of local infection.  Skin prepped in a sterile fashion.  Local anesthesia: Topical Ethyl chloride.  With sterile technique and under real time ultrasound guidance: Mild halo sign noted, 1 cc Kenalog 40, 1 cc lidocaine, 1 cc bupivacaine injected easily Completed without difficulty  Advised to call if fevers/chills, erythema, induration, drainage, or persistent bleeding.  Images permanently stored and available for review in PACS.  Impression: Technically successful ultrasound guided injection.  Procedure: Real-time Ultrasound Guided injection of the right first extensor compartment Device: Samsung HS60  Verbal informed consent obtained.  Time-out conducted.  Noted no overlying erythema, induration, or other signs of local infection.  Skin prepped in a sterile fashion.  Local anesthesia: Topical Ethyl chloride.  With sterile technique and under real time ultrasound guidance: Mild halo sign noted, 1 cc Kenalog 40, 1 cc lidocaine, 1 cc bupivacaine injected easily Completed without difficulty  Advised to call if fevers/chills, erythema, induration, drainage, or persistent bleeding.  Images permanently stored and available for review in PACS.  Impression: Technically successful ultrasound guided injection.  Independent interpretation of notes and tests performed by another provider:   None.  Brief History, Exam, Impression, and Recommendations:    De Quervain's tenosynovitis, bilateral Pleasant 31 year old female, postpartum, bilateral radial sided wrist pain, positive Finkelstein sign bilaterally in spite of conservative treatment, today we did bilateral first extensor compartment injections with ultrasound guidance, return to see  me in 4 weeks for this.  Right lumbar radiculitis There is also Pennsaid (diclofenac 2% topical) back pain with radiation down the right leg to the great toe with numbness and tingling, there is a minimally positive tarsal tunnel Tinel's sign but I thought the majority of the symptomatology was radicular. X-rays did show lumbar DDD. At this point she has failed greater than 6 weeks of physician directed conservative treatment, so we will proceed with MRI, she will let us know if she would like an epidural as well afterwards, suspect right L4 nerve root compression.    ___________________________________________ Ihor Austin. Benjamin Stain, M.D., ABFM., CAQSM. Primary Care and Sports Medicine [facility]  Adjunct Instructor of Family Medicine  [employer]

## 2021-12-21 ENCOUNTER — Ambulatory Visit: Payer: Commercial Managed Care - PPO | Admitting: Sports Medicine

## 2022-01-16 ENCOUNTER — Ambulatory Visit (INDEPENDENT_AMBULATORY_CARE_PROVIDER_SITE_OTHER): Payer: Commercial Managed Care - PPO

## 2022-01-16 DIAGNOSIS — M5416 Radiculopathy, lumbar region: Secondary | ICD-10-CM | POA: Diagnosis not present

## 2022-01-19 ENCOUNTER — Ambulatory Visit (INDEPENDENT_AMBULATORY_CARE_PROVIDER_SITE_OTHER): Payer: Commercial Managed Care - PPO | Admitting: Sports Medicine

## 2022-01-19 DIAGNOSIS — M654 Radial styloid tenosynovitis [de Quervain]: Secondary | ICD-10-CM

## 2022-01-19 DIAGNOSIS — M5416 Radiculopathy, lumbar region: Secondary | ICD-10-CM | POA: Diagnosis not present

## 2022-01-19 NOTE — Assessment & Plan Note (Signed)
 Resolved after bilateral injections at the last visit.

## 2022-01-19 NOTE — Assessment & Plan Note (Signed)
 Mild lumbar DDD. Principal problem is more axial pain rather than radicular right L4 distribution discomfort. I have recommended that she get more consistent with her home conditioning, adding advanced herniated disc strengthening exercises. She will do this for 6 weeks before considering an epidural. She is lactating so we will avoid any long-acting NSAIDs, she continue with over-the-counter Tylenol  and ibuprofen .

## 2022-01-19 NOTE — Progress Notes (Signed)
    Procedures performed today:    None.  Independent interpretation of notes and tests performed by another provider:   None.  Brief History, Exam, Impression, and Recommendations:    De Quervain's tenosynovitis, bilateral Resolved after bilateral injections at the last visit.  Right lumbar radiculitis Mild lumbar DDD. Principal problem is more axial pain rather than radicular right L4 distribution discomfort. I have recommended that she get more consistent with her home conditioning, adding advanced herniated disc strengthening exercises. She will do this for 6 weeks before considering an epidural. She is lactating so we will avoid any long-acting NSAIDs, she continue with over-the-counter Tylenol  and ibuprofen .    ___________________________________________ Debby PARAS. Curtis, M.D., ABFM., CAQSM. Primary Care and Sports Medicine [facility]  Adjunct Instructor of Family Medicine  [employer]

## 2022-01-21 ENCOUNTER — Other Ambulatory Visit: Payer: Self-pay | Admitting: Family Medicine

## 2022-02-05 ENCOUNTER — Encounter: Payer: Self-pay | Admitting: Emergency Medicine

## 2022-02-05 ENCOUNTER — Ambulatory Visit
Admission: RE | Admit: 2022-02-05 | Discharge: 2022-02-05 | Disposition: A | Discharge status: Home / Self Care | Payer: Commercial Managed Care - PPO | Source: Ambulatory Visit | Attending: Family Medicine | Admitting: Family Medicine

## 2022-02-05 ENCOUNTER — Ambulatory Visit: Payer: Self-pay

## 2022-02-05 ENCOUNTER — Emergency Department
Admission: EM | Admit: 2022-02-05 | Discharge: 2022-02-05 | Disposition: A | Discharge status: Home / Self Care | Payer: Commercial Managed Care - PPO | Attending: Student in an Organized Health Care Education/Training Program | Admitting: Student in an Organized Health Care Education/Training Program

## 2022-02-05 ENCOUNTER — Other Ambulatory Visit: Payer: Self-pay

## 2022-02-05 VITALS — BP 124/80 | HR 66 | Temp 99.5°F | Resp 16

## 2022-02-05 DIAGNOSIS — R197 Diarrhea, unspecified: Secondary | ICD-10-CM

## 2022-02-05 DIAGNOSIS — R110 Nausea: Secondary | ICD-10-CM

## 2022-02-05 DIAGNOSIS — R1010 Upper abdominal pain, unspecified: Secondary | ICD-10-CM | POA: Diagnosis not present

## 2022-02-05 DIAGNOSIS — R1013 Epigastric pain: Secondary | ICD-10-CM | POA: Insufficient documentation

## 2022-02-05 LAB — CBC WITH DIFFERENTIAL/PLATELET
Abs Immature Granulocytes: 0.03 10*3/uL (ref 0.00–0.07)
Basophils Absolute: 0.1 10*3/uL (ref 0.0–0.1)
Basophils Relative: 1 %
Eosinophils Absolute: 0.2 10*3/uL (ref 0.0–0.5)
Eosinophils Relative: 2 %
HCT: 42.9 % (ref 36.0–46.0)
Hemoglobin: 13.8 g/dL (ref 12.0–15.0)
Immature Granulocytes: 0 %
Lymphocytes Relative: 25 %
Lymphs Abs: 2.3 10*3/uL (ref 0.7–4.0)
MCH: 26.4 pg (ref 26.0–34.0)
MCHC: 32.2 g/dL (ref 30.0–36.0)
MCV: 82 fL (ref 80.0–100.0)
Monocytes Absolute: 0.6 10*3/uL (ref 0.1–1.0)
Monocytes Relative: 7 %
Neutro Abs: 6.1 10*3/uL (ref 1.7–7.7)
Neutrophils Relative %: 65 %
Platelets: 305 10*3/uL (ref 150–400)
RBC: 5.23 MIL/uL — ABNORMAL HIGH (ref 3.87–5.11)
RDW: 13.1 % (ref 11.5–15.5)
WBC: 9.2 10*3/uL (ref 4.0–10.5)
nRBC: 0 % (ref 0.0–0.2)

## 2022-02-05 LAB — URINALYSIS, ROUTINE W REFLEX MICROSCOPIC
Bilirubin Urine: NEGATIVE
Glucose, UA: NEGATIVE mg/dL
Hgb urine dipstick: NEGATIVE
Ketones, ur: NEGATIVE mg/dL
Leukocytes,Ua: NEGATIVE
Nitrite: NEGATIVE
Protein, ur: NEGATIVE mg/dL
Specific Gravity, Urine: 1.025 (ref 1.005–1.030)
pH: 5 (ref 5.0–8.0)

## 2022-02-05 LAB — COMPREHENSIVE METABOLIC PANEL
ALT: 23 U/L (ref 0–44)
AST: 22 U/L (ref 15–41)
Albumin: 4.3 g/dL (ref 3.5–5.0)
Alkaline Phosphatase: 95 U/L (ref 38–126)
Anion gap: 13 (ref 5–15)
BUN: 14 mg/dL (ref 6–20)
CO2: 23 mmol/L (ref 22–32)
Calcium: 9.6 mg/dL (ref 8.9–10.3)
Chloride: 104 mmol/L (ref 98–111)
Creatinine, Ser: 0.73 mg/dL (ref 0.44–1.00)
GFR, Estimated: >60 mL/min (ref >60)
Glucose, Bld: 86 mg/dL (ref 70–99)
Potassium: 3.7 mmol/L (ref 3.5–5.1)
Sodium: 140 mmol/L (ref 135–145)
Total Bilirubin: 0.7 mg/dL (ref 0.3–1.2)
Total Protein: 7.9 g/dL (ref 6.5–8.1)

## 2022-02-05 LAB — PREGNANCY, URINE: Preg Test, Ur: NEGATIVE

## 2022-02-05 LAB — LIPASE, BLOOD: Lipase: 45 U/L (ref 11–51)

## 2022-02-05 NOTE — ED Provider Triage Note (Signed)
Emergency Medicine Provider Triage Evaluation Note  Ahmaya Ostermiller , a 31 y.o. female  was evaluated in triage.  Pt complains of abdominal pain and diarrhea since Friday. Epigastric pain, non-radiating. Gave birth 4 months ago, vaginal delivery, currently breastfeeding. No fever/chills.  No sick contacts. No pregnancy issues. No discharge, bleeding, or pelvic pain. Day  before symptom onset ate undercook dough.  Review of Systems  Positive: Abdominal pain, diarrhea Negative: fever  Physical Exam  There were no vitals taken for this visit. Gen:   Awake, no distress   Resp:  Normal effort  MSK:   Moves extremities without difficulty  Other:    Medical Decision Making  Medically screening exam initiated at 3:02 PM.  Appropriate orders placed.  Mathea Frieling was informed that the remainder of the evaluation will be completed by another provider, this initial triage assessment does not replace that evaluation, and the importance of remaining in the ED until their evaluation is complete.     Jackelyn Hoehn, PA-C 02/05/22 [phone]

## 2022-02-05 NOTE — ED Notes (Signed)
See triage note  Presents with some n/v/d   States has had several episodes with some abd cramping  no fever

## 2022-02-05 NOTE — Telephone Encounter (Signed)
  Chief Complaint: abdominal pain Symptoms: abdominal pain in upper quadrants, nausea, diarrhea Frequency: 3-4 days Pertinent Negatives: Patient denies constipation or vomiting  Disposition: [] ED /[x] Urgent Care (no appt availability in office) / [] Appointment(In office/virtual)/ []  [facility]/ [] Home Care/ [] Refused Recommended Disposition /[] [facility]/ []  Follow-up with PCP Additional Notes: pt was calling to get appt today. She said symptoms started Friday evening. Unsure if related to eating undercooked cinammon rolls on Thursday morning. Pt is breastfeeding and says that baby is fine and so is husband. Only she is experiencing symptoms. Scheduled for UC at 1400 today. Pt didn't want to drive to [facility] to see PCP.   Reason for Disposition  [1] MODERATE pain (e.g., interferes with normal activities) AND [2] pain comes and goes (cramps) AND [3] present > 24 hours  (Exception: pain with Vomiting or Diarrhea - see that Guideline)  Answer Assessment - Initial Assessment Questions 1. LOCATION: "Where does it hurt?"      Upper abdomen  3. ONSET: "When did the pain begin?" (e.g., minutes, hours or days ago)      3-4 days ago  5. PATTERN "Does the pain come and go, or is it constant?"    - If constant: "Is it getting better, staying the same, or worsening?"      (Note: Constant means the pain never goes away completely; most serious pain is constant and it progresses)     - If intermittent: "How long does it last?" "Do you have pain now?"     (Note: Intermittent means the pain goes away completely between bouts)     Comes and goes  6. SEVERITY: "How bad is the pain?"  (e.g., Scale 1-10; mild, moderate, or severe)   - MILD (1-3): doesn't interfere with normal activities, abdomen soft and not tender to touch    - MODERATE (4-7): interferes with normal activities or awakens from sleep, abdomen tender to touch    - SEVERE (8-10): excruciating pain, doubled over, unable  to do any normal activities      8  10. OTHER SYMPTOMS: "Do you have any other symptoms?" (e.g., back pain, diarrhea, fever, urination pain, vomiting)       Diarrhea, nausea  Protocols used: Abdominal Pain - Female-A-AH

## 2022-02-05 NOTE — Discharge Instructions (Addendum)
Go to the emergency department for evaluation of your abdominal pain and diarrhea. ° °

## 2022-02-05 NOTE — ED Triage Notes (Signed)
Patient presents to Urgent Care with complaints of abdominal pain and diarrhea x 4 days. Pt states she did eat 8 uncooked cinnamon rolls on Thursday. Has a hx of gastrointestinal problems. Took 2 doses of pepto.   Denies fever, changes in medications or recent travel.

## 2022-02-05 NOTE — ED Provider Notes (Signed)
Sarah Gonzales    CSN: [id number] Arrival date & time: 02/05/22  1345      History   Chief Complaint Chief Complaint  Patient presents with   Abdominal Pain    abdominal pain and diarrhea x 3-4 days - Entered by patient    HPI Sarah Gonzales is a 31 y.o. female.  Patient presents with upper abdominal pain and diarrhea x4 days.  She reports 7 episodes of diarrhea so far today.  The abdominal pain is intermittent and associated with nausea.  No vomiting.  No identified aggravating or alleviating factors for abdominal pain.  She is unable to describe the pain and states it is neither sharp nor dull nor cramping.  She denies fever, chills, rash, dysuria, hematuria, vaginal discharge, pelvic pain, or other symptoms.  She took 1 dose of Pepto-Bismol last night and 1 dose this morning.  She has been drinking water and Gatorade.  No recent travel or antibiotics.  Patient states she ate undercooked cinnamon rolls the day before the onset of her symptoms.  Her medical history includes migraine headaches, sleep apnea, obesity, hypertension, depression, de Quervain's tenosynovitis, lumbar radiculitis.  Patient is breastfeeding her 46 month old infant.   The history is provided by the patient and medical records.    Past Medical History:  Diagnosis Date   Anxiety    Bartholin cyst    Chronic headaches    Since the age of 33, severe in the past.   Hypertension    Warts     Patient Active Problem List   Diagnosis Date Noted   Right lumbar radiculitis 11/10/2021   De Quervain's tenosynovitis, bilateral 11/10/2021   Rectal bleeding 11/10/2021   Benign essential hypertension 04/11/2021   Major depression, recurrent (HCC) 04/11/2021   Obesity, Class II, BMI 35-39.9 12/14/2015   Mild sleep apnea 12/05/2015   Migraine headache 07/07/2008    History reviewed. No pertinent surgical history.  OB History     Gravida  1   Para  1   Term  1   Preterm  0   AB  0   Living  1       SAB  0   IAB  0   Ectopic  0   Multiple  0   Live Births  1            Home Medications    Prior to Admission medications   Medication Sig Start Date End Date Taking? Authorizing Provider  FLUoxetine (PROZAC) 20 MG capsule Take 2 capsules by mouth once daily Patient taking differently: Take 40 mg by mouth daily. 06/28/21   Agapito Games, MD  Prenatal Vit-Fe Fumarate-FA (MULTIVITAMIN-PRENATAL) 27-0.8 MG TABS tablet Take 1 tablet by mouth daily at 12 noon.    [provider]  propranolol ER (INDERAL LA) 60 MG 24 hr capsule Take 1 capsule by mouth once daily 01/22/22   Agapito Games, MD    Family History Family History  Problem Relation Age of Onset   Hypertension Mother    Hypertension Father    Sleep apnea Father    Asthma Other        aunt   Bipolar disorder Maternal Aunt    Hypertension Maternal Grandfather    Lung cancer Maternal Grandfather    Hypertension Maternal Grandmother    Hypertension Paternal Grandfather    Hypertension Paternal Grandmother     Social History Social History   Tobacco Use   Smoking status: Never  Smokeless tobacco: Never  Vaping Use   Vaping Use: Never used  Substance Use Topics   Alcohol use: Not Currently    Alcohol/week: 2.0 standard drinks of alcohol    Types: 2 Standard drinks or equivalent per week    Comment: not while preg   Drug use: No     Allergies   Patient has no known allergies.   Review of Systems Review of Systems  Constitutional:  Negative for chills and fever.  Respiratory:  Negative for cough and shortness of breath.   Cardiovascular:  Negative for chest pain and palpitations.  Gastrointestinal:  Positive for abdominal pain, diarrhea and nausea. Negative for vomiting.  Genitourinary:  Negative for dysuria, flank pain, hematuria, pelvic pain and vaginal discharge.  Skin:  Negative for color change and rash.  All other systems reviewed and are negative.    Physical  Exam Triage Vital Signs ED Triage Vitals  Enc Vitals Group     BP      Pulse      Resp      Temp      Temp src      SpO2      Weight      Height      Head Circumference      Peak Flow      Pain Score      Pain Loc      Pain Edu?      Excl. in GC?    No data found.  Updated Vital Signs BP 124/80 (BP Location: Left Arm)   Pulse 66   Temp 99.5 F (37.5 C) (Oral)   Resp 16   SpO2 97%   Breastfeeding Yes   Visual Acuity Right Eye Distance:   Left Eye Distance:   Bilateral Distance:    Right Eye Near:   Left Eye Near:    Bilateral Near:     Physical Exam Vitals and nursing note reviewed.  Constitutional:      General: She is not in acute distress.    Appearance: She is well-developed. She is obese. She is not ill-appearing.  HENT:     Mouth/Throat:     Mouth: Mucous membranes are moist.     Pharynx: Oropharynx is clear.  Eyes:     Conjunctiva/sclera: Conjunctivae normal.  Cardiovascular:     Rate and Rhythm: Normal rate and regular rhythm.     Heart sounds: Normal heart sounds.  Pulmonary:     Effort: Pulmonary effort is normal. No respiratory distress.     Breath sounds: Normal breath sounds.  Abdominal:     General: Bowel sounds are normal.     Palpations: Abdomen is soft.     Tenderness: There is no abdominal tenderness. There is no right CVA tenderness, left CVA tenderness, guarding or rebound.  Musculoskeletal:     Cervical back: Neck supple.  Skin:    General: Skin is warm and dry.     Capillary Refill: Capillary refill takes less than 2 seconds.  Neurological:     General: No focal deficit present.     Mental Status: She is alert and oriented to person, place, and time.     Gait: Gait normal.  Psychiatric:        Mood and Affect: Mood normal.        Behavior: Behavior normal.      UC Treatments / Results  Labs (all labs ordered are listed, but only abnormal results are displayed) Labs Reviewed -  No data to  display  EKG   Radiology No results found.  Procedures Procedures (including critical care time)  Medications Ordered in UC Medications - No data to display  Initial Impression / Assessment and Plan / UC Course  I have reviewed the triage vital signs and the nursing notes.  Pertinent labs & imaging results that were available during my care of the patient were reviewed by me and considered in my medical decision making (see chart for details).    Upper abdominal pain, diarrhea, nausea.  Vital signs are stable.  Abdomen is soft and nontender with good bowel sounds.  Patient has had 7 episodes of diarrhea so far today.  She has nausea but no vomiting.  Discussed limitations of evaluation of her symptoms in an urgent care setting.  Patient prefers to be transferred to the ED. she feels stable to drive herself and declines EMS.  Final Clinical Impressions(s) / UC Diagnoses   Final diagnoses:  Pain of upper abdomen  Diarrhea, unspecified type  Nausea without vomiting     Discharge Instructions      Go to the emergency department for evaluation of your abdominal pain and diarrhea.        ED Prescriptions   None    PDMP not reviewed this encounter.   Mickie Bail, NP 02/05/22 1429

## 2022-02-05 NOTE — ED Notes (Signed)
Patient is being discharged from the Urgent Care and sent to the Emergency Department via personal vehicle . Per Arlana Pouch, NP, patient is in need of higher level of care due to abdominal pain and diarrhea. Patient is aware and verbalizes understanding of plan of care.  Vitals:   02/05/22 1400  BP: 124/80  Pulse: 66  Resp: 16  Temp: 99.5 F (37.5 C)  SpO2: 97%

## 2022-02-05 NOTE — ED Provider Notes (Signed)
[facility] Provider Note    Event Date/Time   First MD Initiated Contact with Patient 02/05/22 1821     (approximate)   History   Abdominal Pain   HPI  Sarah Gonzales is a 31 y.o. female no significant past medical history presents to the ER for evaluation of diarrheal illness some cramping epigastric discomfort for the past few days.  Had 7 episodes of watery nonbloody diarrhea today.  Denies any pain right now.  States that she has been able to stay hydrated no nausea or vomiting.  Went to urgent care and was directed to come to the ER for further evaluation.     Physical Exam   Triage Vital Signs: ED Triage Vitals  Enc Vitals Group     BP 02/05/22 1507 (!) 132/102     Pulse Rate 02/05/22 1507 63     Resp 02/05/22 1507 18     Temp 02/05/22 1507 98 F (36.7 C)     Temp Source 02/05/22 1507 Oral     SpO2 02/05/22 1507 97 %     Weight 02/05/22 1508 210 lb (95.3 kg)     Height 02/05/22 1508 5\' 5"  (1.651 m)     Head Circumference --      Peak Flow --      Pain Score 02/05/22 1508 5     Pain Loc --      Pain Edu? --      Excl. in GC? --     Most recent vital signs: Vitals:   02/05/22 1507 02/05/22 1833  BP: (!) 132/102 128/89  Pulse: 63 68  Resp: 18 18  Temp: 98 F (36.7 C)   SpO2: 97% 98%     Constitutional: Alert  Eyes: Conjunctivae are normal.  Head: Atraumatic. Nose: No congestion/rhinnorhea. Mouth/Throat: Mucous membranes are moist.   Neck: Painless ROM.  Cardiovascular:   Good peripheral circulation. Respiratory: Normal respiratory effort.  No retractions.  Gastrointestinal: Soft and nontender in all four quadrants Musculoskeletal:  no deformity Neurologic:  MAE spontaneously. No gross focal neurologic deficits are appreciated.  Skin:  Skin is warm, dry and intact. No rash noted. Psychiatric: Mood and affect are normal. Speech and behavior are normal.    ED Results / Procedures / Treatments   Labs (all labs ordered  are listed, but only abnormal results are displayed) Labs Reviewed  CBC WITH DIFFERENTIAL/PLATELET - Abnormal; Notable for the following components:      Result Value   RBC 5.23 (*)    All other components within normal limits  URINALYSIS, ROUTINE W REFLEX MICROSCOPIC - Abnormal; Notable for the following components:   Color, Urine YELLOW (*)    APPearance CLEAR (*)    All other components within normal limits  GASTROINTESTINAL PANEL BY PCR, STOOL (REPLACES STOOL CULTURE)  C DIFFICILE QUICK SCREEN W PCR REFLEX    COMPREHENSIVE METABOLIC PANEL  LIPASE, BLOOD  PREGNANCY, URINE     EKG     RADIOLOGY   PROCEDURES:  Critical Care performed:   Procedures   MEDICATIONS ORDERED IN ED: Medications - No data to display   IMPRESSION / MDM / ASSESSMENT AND PLAN / ED COURSE  I reviewed the triage vital signs and the nursing notes.                              Differential diagnosis includes, but is not limited to, enteritis, gastritis, colitis,  appendicitis, SBO, biliary pathology, viral illness, foodborne illness, IV  The patient presented to the ER for evaluation of symptoms as described above.  She is afebrile hemodynamically stable well-appearing in no acute distress with a completely benign abdominal exam.  Her blood work sent for the blood differential is reassuring.  Discussed option for additional imaging including ultrasound and/or CT imaging.  Patient has declined this.  She is tolerating p.o.  I recommended sending stool sample for her diarrhea.  Patient unable to provide additional stool sample.  This is reassuring.  At this point do believe she stable and appropriate for outpatient follow-up.  Discussed supportive care and signs and symptoms for which patient should return to the ER.      FINAL CLINICAL IMPRESSION(S) / ED DIAGNOSES   Final diagnoses:  Diarrhea, unspecified type     Rx / DC Orders   ED Discharge Orders     None        Note:  This  document was prepared using Dragon voice recognition software and may include unintentional dictation errors.    Willy Eddy, MD 02/05/22 [place]

## 2022-02-05 NOTE — ED Triage Notes (Signed)
Pt to ED via POV from UC. Pt reports abdominal and diarrhea x4 days. Pt took pepto with no relief of symptoms. Pt states she had 7 BMs today. Pt is also PP 4 months with vaginal delivery.   Pt also reports she at unhooked cinnamon rolls on Thursday but states symptoms started on Friday.

## 2022-02-06 ENCOUNTER — Telehealth: Payer: Self-pay | Admitting: General Practice

## 2022-02-06 NOTE — Telephone Encounter (Signed)
Transition Care Management Unsuccessful Follow-up Telephone Call  Date of discharge and from where:  02/05/22 from [facility]  Attempts:  1st Attempt  Reason for unsuccessful TCM follow-up call:  Left voice message

## 2022-02-07 NOTE — Telephone Encounter (Signed)
Transition Care Management Unsuccessful Follow-up Telephone Call  Date of discharge and from where:  02/05/22 from [id number]  Attempts:  2nd Attempt  Reason for unsuccessful TCM follow-up call:  Left voice message

## 2022-02-09 ENCOUNTER — Ambulatory Visit: Payer: Commercial Managed Care - PPO | Admitting: Certified Nurse Midwife

## 2022-02-09 ENCOUNTER — Encounter: Payer: Self-pay | Admitting: Certified Nurse Midwife

## 2022-02-09 VITALS — BP 128/81 | HR 76 | Ht 65.0 in | Wt 224.0 lb

## 2022-02-09 DIAGNOSIS — Z30430 Encounter for insertion of intrauterine contraceptive device: Secondary | ICD-10-CM | POA: Diagnosis not present

## 2022-02-09 DIAGNOSIS — N9489 Other specified conditions associated with female genital organs and menstrual cycle: Secondary | ICD-10-CM

## 2022-02-09 DIAGNOSIS — N949 Unspecified condition associated with female genital organs and menstrual cycle: Secondary | ICD-10-CM | POA: Diagnosis not present

## 2022-02-09 LAB — POCT URINALYSIS DIPSTICK
Bilirubin, UA: NEGATIVE
Blood, UA: NEGATIVE
Glucose, UA: NEGATIVE
Ketones, UA: NEGATIVE
Leukocytes, UA: NEGATIVE
Nitrite, UA: NEGATIVE
Protein, UA: NEGATIVE
Spec Grav, UA: <=1.005 — AB (ref 1.010–1.025)
Urobilinogen, UA: NEGATIVE E.U./dL — AB
pH, UA: 6.5 (ref 5.0–8.0)

## 2022-02-09 LAB — POCT URINE PREGNANCY: Preg Test, Ur: NEGATIVE

## 2022-02-09 MED ORDER — LEVONORGESTREL 20 MCG/DAY IU IUD
1.0000 | INTRAUTERINE_SYSTEM | Freq: Once | INTRAUTERINE | Status: AC
  Administered 2022-02-09: 1 via INTRAUTERINE

## 2022-02-09 NOTE — Progress Notes (Signed)
   GYNECOLOGY OFFICE VISIT NOTE  History:  31 y.o. G1P1001 here today for IUD insertion. Had Mirena in past with no problems. Had Depo 3 mos ago. She denies any abnormal vaginal discharge, bleeding, pelvic pain or other concerns.  Also reports some external burning at vagina, would like test for UTI, no other sx.  Past Medical History:  Diagnosis Date   Anxiety    Bartholin cyst    Chronic headaches    Since the age of 70, severe in the past.   Hypertension    Warts     History reviewed. No pertinent surgical history.  The following portions of the patient's history were reviewed and updated as appropriate: allergies, current medications, past family history, past medical history, past social history, past surgical history and problem list.   Health Maintenance:  Normal pap and negative HRHPV on 07/18/20.  completed.  Review of Systems:  Negative except noted in HPI  Objective:  Physical Exam BP 128/81   Pulse 76   Ht 5\' 5"  (1.651 m)   Wt 224 lb (101.6 kg)   BMI 37.28 kg/m  CONSTITUTIONAL: Well-developed, well-nourished female in no acute distress.  HENT:  Normocephalic, atraumatic EYES: Conjunctivae and EOM are normal NECK: Normal range of motion SKIN: Skin is warm and dry NEUROLOGIC: Alert and oriented to person, place, and time PSYCHIATRIC: Normal mood and affect CARDIOVASCULAR: Normal heart rate noted RESPIRATORY: Effort and rate normal PELVIC: Normal appearing external genitalia; normal appearing vaginal mucosa and cervix.  No abnormal discharge noted.  Normal uterine size, no other palpable masses, no uterine or adnexal tenderness. MUSCULOSKELETAL: Normal range of motion  Procedure Note: Pt consented for IUD insertion.  Pt placed in lithotomy position.  Speculum inserted and cervix cleaned with betadine solution.  Uterus sounded to 8 cm; Mirena IUD inserted without difficulty.  Strings cut at approximately 3 cm.  Speculum removed. Tolerated well.  Labs and  Imaging Results for orders placed or performed in visit on 02/09/22 (from the past 24 hour(s))  POCT urine pregnancy     Status: Normal   Collection Time: 02/09/22  9:12 AM  Result Value Ref Range   Preg Test, Ur Negative Negative  POCT Urinalysis Dipstick     Status: Abnormal   Collection Time: 02/09/22  9:12 AM  Result Value Ref Range   Color, UA yellow    Clarity, UA clear    Glucose, UA Negative Negative   Bilirubin, UA neg    Ketones, UA neg    Spec Grav, UA <=1.005 (A) 1.010 - 1.025   Blood, UA neg    pH, UA 6.5 5.0 - 8.0   Protein, UA Negative Negative   Urobilinogen, UA negative (A) 0.2 or 1.0 E.U./dL   Nitrite, UA neg    Leukocytes, UA Negative Negative   Appearance clear    Odor none     Assessment & Plan:   1. Encounter for IUD insertion   2. Vaginal burning     Follow up 4 weeks for string check Ibuprofen/Aleve prn  02/11/22, CNM 02/09/2022 9:46 AM

## 2022-02-09 NOTE — Progress Notes (Signed)
neg

## 2022-02-12 NOTE — Telephone Encounter (Signed)
Transition Care Management Unsuccessful Follow-up Telephone Call  Date of discharge and from where:  02/05/22 from [facility]  Attempts:  3rd Attempt  Reason for unsuccessful TCM follow-up call:  Left voice message

## 2022-02-19 ENCOUNTER — Other Ambulatory Visit: Payer: Self-pay | Admitting: Family Medicine

## 2022-03-09 ENCOUNTER — Encounter: Payer: Self-pay | Admitting: Certified Nurse Midwife

## 2022-03-09 ENCOUNTER — Ambulatory Visit (INDEPENDENT_AMBULATORY_CARE_PROVIDER_SITE_OTHER): Payer: Commercial Managed Care - PPO | Admitting: Sports Medicine

## 2022-03-09 ENCOUNTER — Ambulatory Visit: Payer: Commercial Managed Care - PPO | Admitting: Certified Nurse Midwife

## 2022-03-09 ENCOUNTER — Other Ambulatory Visit (HOSPITAL_COMMUNITY)
Admission: RE | Admit: 2022-03-09 | Discharge: 2022-03-09 | Disposition: A | Discharge status: Home / Self Care | Payer: Commercial Managed Care - PPO | Source: Ambulatory Visit | Attending: Certified Nurse Midwife | Admitting: Certified Nurse Midwife

## 2022-03-09 VITALS — BP 137/83 | HR 65 | Resp 16 | Ht 65.0 in | Wt 232.0 lb

## 2022-03-09 DIAGNOSIS — N750 Cyst of Bartholin's gland: Secondary | ICD-10-CM | POA: Diagnosis not present

## 2022-03-09 DIAGNOSIS — M654 Radial styloid tenosynovitis [de Quervain]: Secondary | ICD-10-CM

## 2022-03-09 DIAGNOSIS — M5136 Other intervertebral disc degeneration, lumbar region: Secondary | ICD-10-CM | POA: Diagnosis not present

## 2022-03-09 DIAGNOSIS — M51369 Other intervertebral disc degeneration, lumbar region without mention of lumbar back pain or lower extremity pain: Secondary | ICD-10-CM

## 2022-03-09 DIAGNOSIS — Z30431 Encounter for routine checking of intrauterine contraceptive device: Secondary | ICD-10-CM

## 2022-03-09 DIAGNOSIS — N898 Other specified noninflammatory disorders of vagina: Secondary | ICD-10-CM | POA: Insufficient documentation

## 2022-03-09 NOTE — Progress Notes (Signed)
Pt c/o's vaginal odor

## 2022-03-09 NOTE — Progress Notes (Signed)
    Procedures performed today:    None.  Independent interpretation of notes and tests performed by another provider:   None.  Brief History, Exam, Impression, and Recommendations:    Lumbar degenerative disc disease Pleasant 31 year old female, new mom to a 38-month-old, continues to have axial back pain, I explained her how normal this was when raising an infant, with the constant bending over, lifting and carrying. She continues to admit that she could increase her diligence with her home conditioning, MRI did show an noncompressive L5-S1 disc protrusion with desiccation. I like to see her back 1 more time in 6 weeks at which point we will order an epidural if she is not better.  De Quervain's tenosynovitis, bilateral Initially did extremely well after an injection in May. Having a slight recurrence of pain, she will get more diligent with conditioning. There is mild postinjection hypopigmentation that I explained to her would resolve.    ____________________________________________ Ihor Austin. Benjamin Stain, M.D., ABFM., CAQSM., AME. Primary Care and Sports Medicine [facility]  Adjunct Professor of Family Medicine  [employer]  Restaurant manager, fast food

## 2022-03-09 NOTE — Assessment & Plan Note (Signed)
Initially did extremely well after an injection in May. Having a slight recurrence of pain, she will get more diligent with conditioning. There is mild postinjection hypopigmentation that I explained to her would resolve.

## 2022-03-09 NOTE — Assessment & Plan Note (Signed)
Pleasant 31 year old female, new mom to a 35-month-old, continues to have axial back pain, I explained her how normal this was when raising an infant, with the constant bending over, lifting and carrying. She continues to admit that she could increase her diligence with her home conditioning, MRI did show an noncompressive L5-S1 disc protrusion with desiccation. I like to see her back 1 more time in 6 weeks at which point we will order an epidural if she is not better.

## 2022-03-09 NOTE — Progress Notes (Signed)
GYNECOLOGY OFFICE VISIT NOTE  History:  31 y.o. G1P1001 here today for IUD string check. Mirena IUD was placed on 02/09/22. She denies any abnormal vaginal discharge, pelvic pain. Endorses vaginal malodor x1 week. Hx of BV, has used boric acid in past. Endorses pain on right side of vagina with sex. Having some spotting the past few days. No concern for STDs.   Past Medical History:  Diagnosis Date   Anxiety    Bartholin cyst    Chronic headaches    Since the age of 42, severe in the past.   Hypertension    Warts     History reviewed. No pertinent surgical history.  The following portions of the patient's history were reviewed and updated as appropriate: allergies, current medications, past family history, past medical history, past social history, past surgical history and problem list.   Review of Systems:  Pertinent items noted in HPI and remainder of comprehensive ROS otherwise negative.  Objective:  Physical Exam BP 137/83   Pulse 65   Resp 16   Ht 5\' 5"  (1.651 m)   Wt 232 lb (105.2 kg)   BMI 38.61 kg/m  CONSTITUTIONAL: Well-developed, well-nourished female in no acute distress.  HENT:  Normocephalic, atraumatic.  SKIN: Skin is warm and dry. No rash noted. Not diaphoretic. No erythema. No pallor. NEUROLOGIC: Alert and oriented to person, place, and time.  PSYCHIATRIC: Normal mood and affect. Normal behavior. Normal judgment and thought content. CARDIOVASCULAR: Normal heart rate noted RESPIRATORY: Effort and rate normal ABDOMEN: Soft, no distention noted.   PELVIC: Normal appearing external genitalia; normal appearing vaginal mucosa and cervix. IUD string seen. No abnormal discharge noted. 1/2cm firm bartholin cyst on right just inside introitus. No edema, erythema or drainage.   Assessment & Plan:   1. Vaginal odor   2. IUD check up   3. Bartholin cyst    - Cervicovaginal ancillary only( [place]) -Ok to use boric acid vag supp   Please refer to After Visit  Summary for other counseling recommendations.   No follow-ups on file.   , CNM 03/09/2022 11:41 AM

## 2022-03-13 LAB — CERVICOVAGINAL ANCILLARY ONLY
Bacterial Vaginitis (gardnerella): NEGATIVE
Candida Glabrata: NEGATIVE
Candida Vaginitis: NEGATIVE
Comment: NEGATIVE
Comment: NEGATIVE
Comment: NEGATIVE

## 2022-04-18 ENCOUNTER — Other Ambulatory Visit: Payer: Self-pay | Admitting: Family Medicine

## 2022-04-20 ENCOUNTER — Telehealth: Payer: Commercial Managed Care - PPO | Admitting: Sports Medicine

## 2022-05-24 ENCOUNTER — Encounter: Payer: Self-pay | Admitting: Family Medicine

## 2022-05-24 ENCOUNTER — Ambulatory Visit (INDEPENDENT_AMBULATORY_CARE_PROVIDER_SITE_OTHER): Payer: Commercial Managed Care - PPO | Admitting: Family Medicine

## 2022-05-24 VITALS — BP 128/81 | HR 75 | Ht 65.0 in | Wt 241.0 lb

## 2022-05-24 DIAGNOSIS — R110 Nausea: Secondary | ICD-10-CM

## 2022-05-24 DIAGNOSIS — G43009 Migraine without aura, not intractable, without status migrainosus: Secondary | ICD-10-CM | POA: Diagnosis not present

## 2022-05-24 DIAGNOSIS — I10 Essential (primary) hypertension: Secondary | ICD-10-CM | POA: Diagnosis not present

## 2022-05-24 DIAGNOSIS — F3341 Major depressive disorder, recurrent, in partial remission: Secondary | ICD-10-CM | POA: Diagnosis not present

## 2022-05-24 MED ORDER — FAMOTIDINE 20 MG PO TABS: 20.0000 mg | ORAL_TABLET | Freq: Two times a day (BID) | ORAL | 1 refills | Status: DC

## 2022-05-24 NOTE — Progress Notes (Signed)
Established Patient Office Visit  Subjective   Patient ID: Sarah Gonzales, female    DOB: [birth year]  Age: 31 y.o. MRN: [id number]  Chief Complaint  Patient presents with   Hypertension    Pt reports that she feels nauseated after taking her bp medication. She said that it doesn't matter what time of day that she takes the medication she feels this way. This has been going on for several months.     HPI  Hypertension- Pt denies chest pain, SOB, dizziness, or heart palpitations.  Taking meds as directed w/o problems.  Denies medication side effects.    F/U MDD -doing well on fluoxetine.  Happy with her current regimen.  She is struggling right now with her newborn infant that she is breast-feeding because is not sleeping great at night, but otherwise she is doing well.  F/U Migraines -migraines have been okay they seem to be occurring again when she has her.  She does have an IUD and but she has been bleeding a little irregularly since breast-feeding.  She also wanted to discuss that for at least a month she has had some pretty consistent nausea.  She has pendent down to when she takes a pill or medication even if she just takes a Tylenol for headache she will notice that she feels nauseated afterwards.  No associated abdominal pain or recent change in bowels.  She has not noticed any specific food triggers but she notices if she takes the pill with a little bit of food the nausea is less intense.  She still has her gallbladder.  No vomiting.     ROS    Objective:     BP 128/81   Pulse 75   Ht 5\' 5"  (1.651 m)   Wt 241 lb (109.3 kg)   SpO2 97%   BMI 40.10 kg/m    Physical Exam Vitals and nursing note reviewed.  Constitutional:      Appearance: She is well-developed.  HENT:     Head: Normocephalic and atraumatic.  Cardiovascular:     Rate and Rhythm: Normal rate and regular rhythm.     Heart sounds: Normal heart sounds.  Pulmonary:     Effort: Pulmonary effort is normal.      Breath sounds: Normal breath sounds.  Abdominal:     General: Bowel sounds are normal.     Palpations: Abdomen is soft.     Tenderness: There is no abdominal tenderness.  Skin:    General: Skin is warm and dry.  Neurological:     Mental Status: She is alert and oriented to person, place, and time.  Psychiatric:        Behavior: Behavior normal.     No results found for any visits on 05/24/22.    The ASCVD Risk score (Arnett DK, et al., 2019) failed to calculate for the following reasons:   The 2019 ASCVD risk score is only valid for ages 77 to 44    Assessment & Plan:   Problem List Items Addressed This Visit       Cardiovascular and Mediastinum   Migraine headache    Well on current regimen of propranolol.  We are using propranolol for blood pressure control and for migraine control.  She is still nursing.      Relevant Orders   COMPLETE METABOLIC PANEL WITH GFR   Lipase   TSH   Benign essential hypertension - Primary    Pressure looks phenomenal today.  We will  continue current regimen.  Can always switch back to an ACE or ARB down the road when she is no longer nursing.      Relevant Orders   COMPLETE METABOLIC PANEL WITH GFR   Lipase   TSH     Other   Major depression, recurrent ([place])    She is happy with her current regimen.  PHQ-9 score of 6 but denies feeling loss of interest or pleasure in doing things or feeling down.  She scored 0 for those first 2 questions.  For generalized anxiety she scored 6.  And no thoughts of wanting to harm herself.      Relevant Orders   COMPLETE METABOLIC PANEL WITH GFR   Lipase   TSH   Other Visit Diagnoses     Nausea       Relevant Medications   famotidine (PEPCID) 20 MG tablet   Other Relevant Orders   COMPLETE METABOLIC PANEL WITH GFR   Lipase   TSH      Nausea-unclear etiology consider GERD, gastritis, ulcer etc.  She is not having any pain with it to suggest gallbladder disease.I like for you to try  the famotidine about 10 to 15 minutes before your first meal of the day and at bedtime on an empty stomach to help reduce the acid in your stomach and see if this helps with the nausea that you have been experiencing.  I would like for you to try it for about 2 weeks.  If you have any problems or concerns let us know.  If its not helping then please let us know and we will refer you to GI for further work-up.  Return in about 6 months (around 11/23/2022) for Hypertension.    Beatrice Lecher, MD

## 2022-05-24 NOTE — Patient Instructions (Signed)
I like for you to try the famotidine about 10 to 15 minutes before your first meal of the day and at bedtime on an empty stomach to help reduce the acid in your stomach and see if this helps with the nausea that you have been experiencing.  I would like for you to try it for about 2 weeks.  If you have any problems or concerns let us know.  If its not helping then please let us know and we will refer you to GI for further work-up.

## 2022-05-24 NOTE — Assessment & Plan Note (Signed)
Pressure looks phenomenal today.  We will continue current regimen.  Can always switch back to an ACE or ARB down the road when she is no longer nursing.

## 2022-05-24 NOTE — Assessment & Plan Note (Signed)
Well on current regimen of propranolol.  We are using propranolol for blood pressure control and for migraine control.  She is still nursing.

## 2022-05-24 NOTE — Assessment & Plan Note (Signed)
She is happy with her current regimen.  PHQ-9 score of 6 but denies feeling loss of interest or pleasure in doing things or feeling down.  She scored 0 for those first 2 questions.  For generalized anxiety she scored 6.  And no thoughts of wanting to harm herself.

## 2022-05-25 LAB — LIPASE: Lipase: 21 U/L (ref 7–60)

## 2022-05-25 LAB — COMPLETE METABOLIC PANEL WITH GFR
AG Ratio: 1.8 (calc) (ref 1.0–2.5)
ALT: 18 U/L (ref 6–29)
AST: 20 U/L (ref 10–30)
Albumin: 4.9 g/dL (ref 3.6–5.1)
Alkaline phosphatase (APISO): 98 U/L (ref 31–125)
BUN: 9 mg/dL (ref 7–25)
CO2: 32 mmol/L (ref 20–32)
Calcium: 10.3 mg/dL — ABNORMAL HIGH (ref 8.6–10.2)
Chloride: 100 mmol/L (ref 98–110)
Creat: 0.65 mg/dL (ref 0.50–0.97)
Globulin: 2.8 g/dL (calc) (ref 1.9–3.7)
Glucose, Bld: 82 mg/dL (ref 65–99)
Potassium: 3.9 mmol/L (ref 3.5–5.3)
Sodium: 140 mmol/L (ref 135–146)
Total Bilirubin: 0.5 mg/dL (ref 0.2–1.2)
Total Protein: 7.7 g/dL (ref 6.1–8.1)
eGFR: 121 mL/min/{1.73_m2} (ref >=60)

## 2022-05-25 LAB — TSH: TSH: 1.79 mIU/L

## 2022-05-25 NOTE — Progress Notes (Signed)
Sarah Gonzales, your metabolic panel looks good.  Calcium was a little bit elevated but not in a concerning or worrisome range.  If you are taking some extra calcium sometimes that can bump your level up as well.  Thyroid looks great.  No sign of pancreatitis.  Liver function looks normal.  Nothing specific to explain your nausea so I really think we should get you referred to GI for further work-up.  If that is okay then please let me know and we will do so.  If you have a preference for provider and or location then please let me know.

## 2022-05-28 ENCOUNTER — Other Ambulatory Visit: Payer: Self-pay | Admitting: *Deleted

## 2022-05-28 DIAGNOSIS — R110 Nausea: Secondary | ICD-10-CM

## 2022-05-28 MED ORDER — FLUOXETINE HCL 20 MG PO CAPS: 40.0000 mg | ORAL_CAPSULE | Freq: Every day | ORAL | 1 refills | Status: DC

## 2022-06-19 ENCOUNTER — Ambulatory Visit: Payer: Commercial Managed Care - PPO | Admitting: Gastroenterology

## 2022-07-13 ENCOUNTER — Encounter: Payer: Self-pay | Admitting: Sports Medicine

## 2022-07-13 ENCOUNTER — Ambulatory Visit (INDEPENDENT_AMBULATORY_CARE_PROVIDER_SITE_OTHER): Payer: Commercial Managed Care - PPO | Admitting: Sports Medicine

## 2022-07-13 DIAGNOSIS — M654 Radial styloid tenosynovitis [de Quervain]: Secondary | ICD-10-CM

## 2022-07-13 MED ORDER — MELOXICAM 15 MG PO TABS: ORAL_TABLET | ORAL | 3 refills | Status: DC

## 2022-07-13 NOTE — Progress Notes (Signed)
This is a very pleasant   Procedures performed today:    None.  Independent interpretation of notes and tests performed by another provider:   None.  Brief History, Exam, Impression, and Recommendations:    De Quervain's tenosynovitis, bilateral This is a 31 year old female, she is postpartum, she had bilateral de Quervain's tendinitis that responded extremely well to a bilateral first extensor compartment injection back in May of this year, she did well until recently, she has been picking up her baby quite often, and is having recurrence of pain right side along the first extensor compartment. She has started some of her home therapy, she should continue. We will going proceed with an MRI of the right wrist, thumb spica brace, meloxicam. We will likely be putting her into formal physical therapy after the MRI.  For insurance coverage purposes she has failed greater than 6 weeks of physician directed conservative treatment, x-rays unrevealing, NSAIDs and injections not helpful.    ____________________________________________ Ihor [place]. Benjamin Stain, M.D., ABFM., CAQSM., AME. Primary Care and Sports Medicine [facility]  Adjunct Professor of Family Medicine  [employer]  Restaurant manager, fast food

## 2022-07-13 NOTE — Assessment & Plan Note (Signed)
This is a 31 year old female, she is postpartum, she had bilateral de Quervain's tendinitis that responded extremely well to a bilateral first extensor compartment injection back in May of this year, she did well until recently, she has been picking up her baby quite often, and is having recurrence of pain right side along the first extensor compartment. She has started some of her home therapy, she should continue. We will going proceed with an MRI of the right wrist, thumb spica brace, meloxicam. We will likely be putting her into formal physical therapy after the MRI.  For insurance coverage purposes she has failed greater than 6 weeks of physician directed conservative treatment, x-rays unrevealing, NSAIDs and injections not helpful.

## 2022-07-21 ENCOUNTER — Other Ambulatory Visit: Payer: Self-pay | Admitting: Family Medicine

## 2022-07-29 ENCOUNTER — Ambulatory Visit (INDEPENDENT_AMBULATORY_CARE_PROVIDER_SITE_OTHER): Payer: Commercial Managed Care - PPO

## 2022-07-29 DIAGNOSIS — M654 Radial styloid tenosynovitis [de Quervain]: Secondary | ICD-10-CM | POA: Diagnosis not present

## 2022-11-11 ENCOUNTER — Ambulatory Visit
Admission: EM | Admit: 2022-11-11 | Discharge: 2022-11-11 | Disposition: A | Discharge status: Home / Self Care | Payer: Commercial Managed Care - PPO

## 2022-11-11 DIAGNOSIS — J069 Acute upper respiratory infection, unspecified: Secondary | ICD-10-CM

## 2022-11-11 NOTE — Discharge Instructions (Signed)
Follow up here or with your primary care provider if your symptoms are worsening or not improving.     

## 2022-11-11 NOTE — ED Provider Notes (Signed)
Sarah Gonzales    CSN: [id number] Arrival date & time: 11/11/22  1202      History   Chief Complaint No chief complaint on file.   HPI Sarah Gonzales is a 32 y.o. female.   HPI  Patient presents to urgent care with complaint of headache, nasal congestion, bilateral ear pain x 3 days.  She is concerned with possible ear infection.  Using ibuprofen for her symptoms.  She is accompanied by her child who also likely has otalgia.  Past Medical History:  Diagnosis Date   Anxiety    Bartholin cyst    Chronic headaches    Since the age of 89, severe in the past.   Hypertension    Warts     Patient Active Problem List   Diagnosis Date Noted   Lumbar degenerative disc disease 11/10/2021   De Quervain's tenosynovitis, bilateral 11/10/2021   Rectal bleeding 11/10/2021   Benign essential hypertension 04/11/2021   Major depression, recurrent 04/11/2021   Obesity, Class II, BMI 35-39.9 12/14/2015   Mild sleep apnea 12/05/2015   Migraine headache 07/07/2008    No past surgical history on file.  OB History     Gravida  1   Para  1   Term  1   Preterm  0   AB  0   Living  1      SAB  0   IAB  0   Ectopic  0   Multiple  0   Live Births  1            Home Medications    Prior to Admission medications   Medication Sig Start Date End Date Taking? Authorizing Provider  famotidine (PEPCID) 20 MG tablet Take 1 tablet (20 mg total) by mouth 2 (two) times daily. 05/24/22   Agapito Games, MD  FLUoxetine (PROZAC) 20 MG capsule Take 2 capsules (40 mg total) by mouth daily. 05/28/22   Agapito Games, MD  levonorgestrel (MIRENA) 20 MCG/DAY IUD 1 each by Intrauterine route once.    [provider]  meloxicam (MOBIC) 15 MG tablet One tab PO every 24 hours with a meal for 2 weeks, then once every 24 hours prn pain. 07/13/22   Monica Becton, MD  Prenatal Vit-Fe Fumarate-FA (MULTIVITAMIN-PRENATAL) 27-0.8 MG TABS tablet Take 1  tablet by mouth daily at 12 noon.    [provider]  propranolol ER (INDERAL LA) 60 MG 24 hr capsule Take 1 capsule by mouth once daily 07/23/22   Agapito Games, MD    Family History Family History  Problem Relation Age of Onset   Hypertension Mother    Hypertension Father    Sleep apnea Father    Asthma Other        aunt   Bipolar disorder Maternal Aunt    Hypertension Maternal Grandfather    Lung cancer Maternal Grandfather    Hypertension Maternal Grandmother    Hypertension Paternal Grandfather    Hypertension Paternal Grandmother     Social History Social History   Tobacco Use   Smoking status: Never   Smokeless tobacco: Never  Vaping Use   Vaping Use: Never used  Substance Use Topics   Alcohol use: Not Currently    Alcohol/week: 2.0 standard drinks of alcohol    Types: 2 Standard drinks or equivalent per week    Comment: not while preg   Drug use: No     Allergies   Patient has no  known allergies.   Review of Systems Review of Systems   Physical Exam Triage Vital Signs ED Triage Vitals  Enc Vitals Group     BP      Pulse      Resp      Temp      Temp src      SpO2      Weight      Height      Head Circumference      Peak Flow      Pain Score      Pain Loc      Pain Edu?      Excl. in GC?    No data found.  Updated Vital Signs There were no vitals taken for this visit.  Visual Acuity Right Eye Distance:   Left Eye Distance:   Bilateral Distance:    Right Eye Near:   Left Eye Near:    Bilateral Near:     Physical Exam Vitals reviewed.  Constitutional:      Appearance: Normal appearance.  HENT:     Right Ear: Tympanic membrane normal.     Left Ear: Tympanic membrane normal.  Skin:    General: Skin is warm and dry.  Neurological:     General: No focal deficit present.     Mental Status: She is alert and oriented to person, place, and time.  Psychiatric:        Mood and Affect: Mood normal.        Behavior:  Behavior normal.      UC Treatments / Results  Labs (all labs ordered are listed, but only abnormal results are displayed) Labs Reviewed - No data to display  EKG   Radiology No results found.  Procedures Procedures (including critical care time)  Medications Ordered in UC Medications - No data to display  Initial Impression / Assessment and Plan / UC Course  I have reviewed the triage vital signs and the nursing notes.  Pertinent labs & imaging results that were available during my care of the patient were reviewed by me and considered in my medical decision making (see chart for details).   Sarah Gonzales is a 32 y.o. female presenting with otalgia. Patient is afebrile without recent antipyretics, satting well on room air. Overall is well appearing though non-toxic, well hydrated, without respiratory distress. Pulmonary exam is unremarkable.  Lungs CTAB without wheezing, rhonchi, rales.  TMs are nonerythematous.  Possibly middle ear effusion is present.  Symptoms are consistent with an acute viral process resulting in sinusitis and eustachian tube dysfunction.  Recommending supportive care and use of OTC medication for symptom control.  Counseled patient on potential for adverse effects with medications prescribed/recommended today, ER and return-to-clinic precautions discussed, patient verbalized understanding and agreement with care plan.   Final Clinical Impressions(s) / UC Diagnoses   Final diagnoses:  None   Discharge Instructions   None    ED Prescriptions   None    PDMP not reviewed this encounter.   Charma Igo, FNP 11/11/22 1300

## 2022-11-11 NOTE — ED Triage Notes (Signed)
Patient presents to UC for HA, nasal congestion, and bilateral ear pain x 3 days. Concerned with ear infection. Taking ibuprofen.

## 2022-12-06 ENCOUNTER — Ambulatory Visit: Payer: Commercial Managed Care - PPO | Admitting: Family Medicine

## 2022-12-07 ENCOUNTER — Encounter: Payer: Self-pay | Admitting: Family Medicine

## 2022-12-07 ENCOUNTER — Ambulatory Visit: Payer: Commercial Managed Care - PPO | Admitting: Family Medicine

## 2022-12-07 VITALS — BP 135/84 | HR 85 | Ht 65.0 in | Wt 239.0 lb

## 2022-12-07 DIAGNOSIS — M549 Dorsalgia, unspecified: Secondary | ICD-10-CM

## 2022-12-07 DIAGNOSIS — G43009 Migraine without aura, not intractable, without status migrainosus: Secondary | ICD-10-CM

## 2022-12-07 MED ORDER — ELETRIPTAN HYDROBROMIDE 40 MG PO TABS: 40.0000 mg | ORAL_TABLET | ORAL | 5 refills | Status: DC | PRN

## 2022-12-07 NOTE — Patient Instructions (Signed)
Let me know if the migraines or not improving over the next month or 2.  And let me know if your backs not improving over the next 2 to 3 weeks with the exercises.

## 2022-12-07 NOTE — Assessment & Plan Note (Signed)
Discussed headaches.  Continue with propranolol for now.  She could be having some hormonal fluctuation especially if she is starting to have periods again and she is tapering off breast-feeding.  She plans on stopping completely over the next 3 to 4 months.  At that point we could look at other options such as Topamax.  I did verify that Samson Frederic triptan is safe to take during breast-feeding so we will send over a new updated prescription to the pharmacy.  Also try to work on improving sleep quality, staying well-hydrated, working on stress reduction and avoiding trigger foods for migraines.

## 2022-12-07 NOTE — Progress Notes (Signed)
Acute Office Visit  Subjective:     Patient ID: Sarah Gonzales, female    DOB: [birth year], 32 y.o.   MRN: [id number]  Chief Complaint  Patient presents with   Migraine    HPI Patient is in today for more freq HA past few months, was really bad this last weekend.  She is still breast feeding.  Hx of migraines.  She had previously taken [facility] triptan.  She is on propranolol for blood pressure and for prevention and normally that works well for her.  She is started tapering breast-feeding and so has had a few periods here and there.  No other major changes in diet.  Sleep has not been great usually wakes up in the middle the night with her AB and that really disrupts her sleep quality but she has not had any changes in her diet recently.  She does not drink caffeine and she has really abstained from alcohol.  She is having right sided back pain x 2 weeks. She was sick when it started. She started exercising by dancing.  Says it is very tender to touch and at times it is a sharp sudden pain.  It can happen even with just driving in the car if seems to be getting worse over the last couple of weeks.  She has started to exercise recently.  But denies any specific trauma or injury.  She does pick up her 83-month-old and put him on her hip.  She has been under a lot of stress with several sicknesses going through the house.  She is tired of being sick    ROS      Objective:    BP 135/84   Pulse 85   Ht 5\' 5"  (1.651 m)   Wt 239 lb (108.4 kg)   SpO2 99%   Breastfeeding Yes   BMI 39.77 kg/m    Physical Exam Vitals and nursing note reviewed.  Constitutional:      Appearance: She is well-developed.  HENT:     Head: Normocephalic and atraumatic.  Cardiovascular:     Rate and Rhythm: Normal rate and regular rhythm.     Heart sounds: Normal heart sounds.  Pulmonary:     Effort: Pulmonary effort is normal.     Breath sounds: Normal breath sounds.  Musculoskeletal:     Comments: Tender  over the right mid back muscles.  Nontender over the thoracic or lumbar spine.  Skin:    General: Skin is warm and dry.  Neurological:     Mental Status: She is alert and oriented to person, place, and time.  Psychiatric:        Behavior: Behavior normal.     No results found for any visits on 12/07/22.      Assessment & Plan:   Problem List Items Addressed This Visit       Cardiovascular and Mediastinum   Migraine headache - Primary    Discussed headaches.  Continue with propranolol for now.  She could be having some hormonal fluctuation especially if she is starting to have periods again and she is tapering off breast-feeding.  She plans on stopping completely over the next 3 to 4 months.  At that point we could look at other options such as Topamax.  I did verify that Samson Frederic triptan is safe to take during breast-feeding so we will send over a new updated prescription to the pharmacy.  Also try to work on improving sleep quality, staying well-hydrated, working  on stress reduction and avoiding trigger foods for migraines.      Relevant Medications   eletriptan (RELPAX) 40 MG tablet   Other Visit Diagnoses     Mid back pain on right side          Right lateral back pain-she is tender directly over the mid back more consistent with a musculoskeletal strain or injury.  Given handout to do stretches on her own at home recommend heat or ice whichever feels better.  If not continuing to improve I think she would do great with formal physical therapy.  Try to avoid heavy lifting if at all possible.  Does have a history of degenerative disc disease so radiculopathy is a possibility as well.  Meds ordered this encounter  Medications   eletriptan (RELPAX) 40 MG tablet    Sig: Take 1 tablet (40 mg total) by mouth as needed for migraine or headache. May repeat in 2 hours if headache persists or recurs.    Dispense:  10 tablet    Refill:  5    No follow-ups on file.  Nani Gasser, MD

## 2022-12-25 ENCOUNTER — Other Ambulatory Visit: Payer: Self-pay | Admitting: Family Medicine

## 2023-04-29 ENCOUNTER — Telehealth: Payer: Self-pay | Admitting: Family Medicine

## 2023-04-29 NOTE — Telephone Encounter (Signed)
Pt would like to switch preferred pharmacy to [facility]. The address is [address], [place]. Provider I.D. Code is [phone]. She is in need of refillson Fluoxetine and propranolol.

## 2023-04-30 ENCOUNTER — Encounter: Payer: Self-pay | Admitting: Family Medicine

## 2023-05-01 ENCOUNTER — Other Ambulatory Visit: Payer: Self-pay | Admitting: Family Medicine

## 2023-05-01 MED ORDER — PROPRANOLOL HCL ER 60 MG PO CP24: 60.0000 mg | ORAL_CAPSULE | Freq: Every day | ORAL | 0 refills | Status: DC

## 2023-05-01 MED ORDER — FLUOXETINE HCL 20 MG PO CAPS: 40.0000 mg | ORAL_CAPSULE | Freq: Every day | ORAL | 0 refills | Status: DC

## 2023-05-02 ENCOUNTER — Telehealth: Payer: Self-pay

## 2023-05-02 NOTE — Telephone Encounter (Signed)
Pharmacy advised  

## 2023-05-02 NOTE — Telephone Encounter (Signed)
Yes ok to fill. Thank you.

## 2023-05-02 NOTE — Telephone Encounter (Signed)
[facility] pharmacy called asking if it was ok for patient to take fluoxetine 40 mg daily while breastfeeding.

## 2023-07-06 ENCOUNTER — Other Ambulatory Visit: Payer: Self-pay | Admitting: Family Medicine

## 2023-07-07 ENCOUNTER — Other Ambulatory Visit: Payer: Self-pay | Admitting: Family Medicine

## 2023-07-18 ENCOUNTER — Ambulatory Visit: Payer: Commercial Managed Care - PPO | Admitting: Medical-Surgical

## 2023-07-18 ENCOUNTER — Encounter: Payer: Self-pay | Admitting: Medical-Surgical

## 2023-07-18 VITALS — BP 123/84 | HR 92 | Resp 20 | Ht 65.0 in | Wt 254.3 lb

## 2023-07-18 DIAGNOSIS — G43009 Migraine without aura, not intractable, without status migrainosus: Secondary | ICD-10-CM

## 2023-07-18 MED ORDER — PROPRANOLOL HCL ER 80 MG PO CP24: 80.0000 mg | ORAL_CAPSULE | Freq: Every day | ORAL | 0 refills | Status: DC

## 2023-07-18 MED ORDER — RIZATRIPTAN BENZOATE 10 MG PO TBDP: 10.0000 mg | ORAL_TABLET | ORAL | 3 refills | Status: DC | PRN

## 2023-07-18 NOTE — Progress Notes (Signed)
        Established patient visit  History, exam, impression, and plan:  1. Migraine without aura and without status migrainosus, not intractable (Primary) Pleasant 32 year old female presenting today to discuss migraines.  She has a long history of migraines headaches since back into childhood.  When she became pregnant, her migraines disappeared.  She has done well throughout pregnancy and while breast-feeding.  She weaned off breast-feeding recently and has noted that the migraines have come back.  They do seem to be in a different pattern and only occur day 1-3 of her menstrual cycle.  When they do occur, they are intractable and at times debilitating.  She has been on propranolol in the past and it was extremely helpful however currently taking Inderal LA 60 mg daily and has not seen benefit.  She does have Relpax 40 mg at home that she takes as an abortive agent however she almost always has to take 2 of these for any relief and the headache does not go away.  Currently using an IUD for birth control.  According to her chart, she has tried Topamax and Imitrex in the past but nothing more recent.  Blood pressure is stable.  Heart rate in the 90s.  Increasing Inderal LA to 80 mg daily.  Switching from Relpax to Maxalt to see if this is more helpful.  Recommend follow-up with her PCP in approximately 4 weeks.   Procedures performed this visit: None.  Return in about 4 weeks (around 08/15/2023) for migraine follow up.  __________________________________ Thayer Ohm, DNP, APRN, FNP-BC Primary Care and Sports Medicine [facility] [place]

## 2023-07-19 ENCOUNTER — Encounter: Payer: Self-pay | Admitting: Medical-Surgical

## 2023-08-19 ENCOUNTER — Ambulatory Visit: Payer: Commercial Managed Care - PPO | Admitting: Medical-Surgical

## 2023-08-20 ENCOUNTER — Ambulatory Visit: Payer: Commercial Managed Care - PPO | Admitting: Family Medicine

## 2023-08-20 ENCOUNTER — Encounter: Payer: Self-pay | Admitting: Family Medicine

## 2023-08-20 VITALS — BP 134/79 | HR 101 | Ht 65.0 in | Wt 255.0 lb

## 2023-08-20 DIAGNOSIS — Z6841 Body Mass Index (BMI) 40.0 and over, adult: Secondary | ICD-10-CM

## 2023-08-20 DIAGNOSIS — I10 Essential (primary) hypertension: Secondary | ICD-10-CM | POA: Diagnosis not present

## 2023-08-20 DIAGNOSIS — G43009 Migraine without aura, not intractable, without status migrainosus: Secondary | ICD-10-CM

## 2023-08-20 MED ORDER — EMGALITY 120 MG/ML ~~LOC~~ SOAJ
SUBCUTANEOUS | 5 refills | Status: DC
Start: 2023-08-20 — End: 2023-08-22

## 2023-08-20 NOTE — Progress Notes (Signed)
Established Patient Office Visit  Subjective  Patient ID: Sarah Gonzales, female    DOB: 01-04-1991  Age: 33 y.o. MRN: [id number]  Chief Complaint  Patient presents with   Migraine    Patient states symptoms worse 2- 3 days at initial start of menstruation and when laying down.  Was seen by Christen Butter , NP on 07/18/2023 for migraine. Switched from Relpax to maxalt and increased propanolol to 80mg  -states there was  no migraines at all during pregnancy and while  breast feeding .     HPI   Patient states symptoms worse 2- 3 days at initial start of menstruation and when laying down.  Was seen by Christen Butter , NP on 07/18/2023 for migraine. Switched from Relpax to maxalt and increased propanolol to 80mg  -states there was  no migraines at all during pregnancy and while  breast feeding     She feels like as she is gotten older her migraines have gotten worse.  They are very consistently triggered by her menstrual cycle and then sometimes she will have 1 day of migraine in between.  So on average she is getting 6 migraine headaches per month.  She did go up on the propranolol.  She definitely gets light, sound and smell sensitivity and almost always gets pain just above her left eye as well as some nausea.  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  She does have a Mirena IUD and is not planning on having anymore children for at least another year or 2.  She is using a migraine app called migraine Buddy.    ROS    Objective:     BP 134/79 (BP Location: Left Arm, Patient Position: Sitting, Cuff Size: Large)   Pulse (!) 101   Ht 5\' 5"  (1.651 m)   Wt 255 lb (115.7 kg)   SpO2 96%   BMI 42.43 kg/m    Physical Exam Vitals reviewed.  Constitutional:      Appearance: Normal appearance.  HENT:     Head: Normocephalic.  Pulmonary:     Effort: Pulmonary effort is  normal.  Neurological:     Mental Status: She is alert and oriented to person, place, and time.  Psychiatric:        Mood and Affect: Mood normal.        Behavior: Behavior normal.      No results found for any visits on 08/20/23.    The ASCVD Risk score (Arnett DK, et al., 2019) failed to calculate for the following reasons:   The 2019 ASCVD risk score is only valid for ages 25 to 21    Assessment & Plan:   Problem List Items Addressed This Visit       Cardiovascular and Mediastinum   Migraine headache - Primary   She is already on increased dose of propranolol.  She took Topamax years ago but this will be currently contraindicated with her Mirena.  I want to avoid amitriptyline or nortriptyline as she is currently trying to lose weight.  We did discuss some of the newer agents she is open to doing an injectable such as Emgality.  See if we can get this approved through the insurance if not could consider oral Qulipta.  If it is covered then she may be able to apply coupon card to bring  the price down I like to see her back in 2 to 3 months to make sure that she is doing well.  Continue to use Maxalt as needed.      Relevant Medications   Galcanezumab-gnlm (EMGALITY) 120 MG/ML SOAJ   Benign essential hypertension   Solik just a little borderline today we will keep an eye on it.        Other   BMI 40.0-44.9, adult (HCC)   BMI 42.  She says she plans on starting to get back on track with her diet she has gained 50 pounds since giving birth and really wants to work on getting that back off.  She knows she needs to get more active as well.       Return in about 2 months (around 10/18/2023) for Migraines.    Nani Gasser, MD

## 2023-08-20 NOTE — Assessment & Plan Note (Signed)
Solik just a little borderline today we will keep an eye on it.

## 2023-08-20 NOTE — Assessment & Plan Note (Addendum)
She is already on increased dose of propranolol.  She took Topamax years ago but this will be currently contraindicated with her Mirena.  I want to avoid amitriptyline or nortriptyline as she is currently trying to lose weight.  We did discuss some of the newer agents she is open to doing an injectable such as Emgality.  See if we can get this approved through the insurance if not could consider oral Qulipta.  If it is covered then she may be able to apply coupon card to bring the price down I like to see her back in 2 to 3 months to make sure that she is doing well.  Continue to use Maxalt as needed.

## 2023-08-20 NOTE — Assessment & Plan Note (Signed)
BMI 42.  She says she plans on starting to get back on track with her diet she has gained 50 pounds since giving birth and really wants to work on getting that back off.  She knows she needs to get more active as well.

## 2023-08-21 ENCOUNTER — Encounter: Payer: Self-pay | Admitting: Family Medicine

## 2023-08-21 DIAGNOSIS — G43009 Migraine without aura, not intractable, without status migrainosus: Secondary | ICD-10-CM

## 2023-08-22 MED ORDER — AIMOVIG 70 MG/ML ~~LOC~~ SOAJ: 70.0000 mg | SUBCUTANEOUS | 3 refills | Status: DC

## 2023-08-30 ENCOUNTER — Telehealth: Payer: Self-pay

## 2023-08-30 NOTE — Telephone Encounter (Signed)
PA initiated for [employer] (Key: [id number]) PA Case ID #: [id number] Rx #: [id number] Need Help? Call us at [phone] Status sent iconSent to Plan today Drug Aimovig 70MG /ML auto-injectors ePA cloud logo Form [facility] Electronic Prior Authorization Form (2017 NCPDP) Original Claim Info [address] ePA at [email or web address] Requires Prior Authorization

## 2023-08-30 NOTE — Telephone Encounter (Unsigned)
Copied from CRM [id number]. Topic: Clinical - Prescription Issue >> Aug 30, 2023 12:11 PM Fuller Mandril wrote: Reason for CRM: Patient called to check status of prior authorization for medication Erenumab-aooe (AIMOVIG) 70 MG/ML SOAJ. Thank You

## 2023-09-02 NOTE — Telephone Encounter (Signed)
 Pharmacy informed.

## 2023-09-02 NOTE — Telephone Encounter (Signed)
Approval received for [employer] (Key: [id number]) PA Case ID #: [id number] Rx #: [id number] Need Help? Call us at [phone] Outcome Approved on January 31 by [facility] 2017 NCPDP Request Reference Number: [id number]. AIMOVIG INJ 70MG /ML is approved through 02/27/2024. Your patient may now fill this prescription and it will be covered. Authorization Expiration Date: 02/27/2024 Drug Aimovig 70MG /ML auto-injectors ePA cloud logo Form [facility] Electronic Prior Authorization Form [id number] NCPDP) Original Claim Info [address] Rad[address] ePA at [email or web address] Requires Prior Authorization

## 2023-09-10 ENCOUNTER — Ambulatory Visit: Payer: Commercial Managed Care - PPO | Admitting: General Practice

## 2023-09-10 ENCOUNTER — Encounter: Payer: Self-pay | Admitting: General Practice

## 2023-09-10 VITALS — BP 132/74 | HR 75 | Temp 98.0°F | Ht 66.0 in | Wt 256.0 lb

## 2023-09-10 DIAGNOSIS — M79602 Pain in left arm: Secondary | ICD-10-CM | POA: Diagnosis not present

## 2023-09-10 DIAGNOSIS — Z30431 Encounter for routine checking of intrauterine contraceptive device: Secondary | ICD-10-CM | POA: Insufficient documentation

## 2023-09-10 DIAGNOSIS — F330 Major depressive disorder, recurrent, mild: Secondary | ICD-10-CM | POA: Diagnosis not present

## 2023-09-10 DIAGNOSIS — Z7689 Persons encountering health services in other specified circumstances: Secondary | ICD-10-CM | POA: Insufficient documentation

## 2023-09-10 DIAGNOSIS — G43001 Migraine without aura, not intractable, with status migrainosus: Secondary | ICD-10-CM

## 2023-09-10 DIAGNOSIS — Z30432 Encounter for removal of intrauterine contraceptive device: Secondary | ICD-10-CM | POA: Insufficient documentation

## 2023-09-10 DIAGNOSIS — I10 Essential (primary) hypertension: Secondary | ICD-10-CM

## 2023-09-10 MED ORDER — CYCLOBENZAPRINE HCL 5 MG PO TABS: 5.0000 mg | ORAL_TABLET | Freq: Three times a day (TID) | ORAL | 0 refills | Status: DC | PRN

## 2023-09-10 MED ORDER — PREDNISONE 10 MG (21) PO TBPK
ORAL_TABLET | ORAL | 0 refills | Status: DC
Start: 2023-09-10 — End: 2023-12-06

## 2023-09-10 NOTE — Assessment & Plan Note (Signed)
Referral placed for GYN due to location.

## 2023-09-10 NOTE — Assessment & Plan Note (Signed)
Controlled.  Continue prozac once daily.      09/10/2023   12:08 PM 08/20/2023    3:40 PM 12/07/2022    3:37 PM 05/24/2022   12:37 PM 03/14/2021    9:44 AM  Depression screen PHQ 2/9  Decreased Interest 0 1 0 0 0  Down, Depressed, Hopeless 0 1 0 0 0  PHQ - 2 Score 0 2 0 0 0  Altered sleeping 0 0  2 0  Tired, decreased energy 0 1  2 2   Change in appetite 0 0  2 0  Feeling bad or failure about yourself  0 0  0 0  Trouble concentrating 0 0  0 0  Moving slowly or fidgety/restless 0 0  0 0  Suicidal thoughts 0 0  0 0  PHQ-9 Score 0 3  6 2   Difficult doing work/chores Not difficult at all Not difficult at all  Somewhat difficult

## 2023-09-10 NOTE — Assessment & Plan Note (Signed)
EMR reviewed briefly.

## 2023-09-10 NOTE — Assessment & Plan Note (Signed)
Differentials include muscle strain, arthritis, elbow tendinopathy   Discussed treatment options.  Rx sent for prednisone pack and flexeril.   She will update in 2 weeks. If not better, consider imaging.

## 2023-09-10 NOTE — Patient Instructions (Addendum)
Start Erenumab injections once every 30 days.   Please update me in 2 weeks regarding your arm.   Start prednisone pack. Follow instructions on pack.  Flexeril one tablet up to three times a day as needed. It can make you drowsy.   Schedule follow up in 3 months.   It was a pleasure meeting you!

## 2023-09-10 NOTE — Assessment & Plan Note (Signed)
Slightly elevated.  Continue Propanolol.

## 2023-09-10 NOTE — Progress Notes (Signed)
New Patient Office Visit  Subjective    Patient ID: Sarah Gonzales, female    DOB: [birth year]  Age: 33 y.o. MRN: [id number]  CC:  Chief Complaint  Patient presents with   New Patient (Initial Visit)   Migraine    Currently taking maxalt and propranolol; going to start Erenumab-aooe (AIMOVIG) 70 MG/ML SOAJ when she picks up her coupon.    Arm Pain    Left arm pain x couple weeks. Patient thinks she pulled something helping her husband. Patient has been trying heat and ice on arm.     HPI Sarah Gonzales is a 33 y.o. female presents to establish care.  Previous PCP/physical/labs: Nani Gasser. Physical and labs- 2023.   Migraines: diagnosed several years ago. Currently managed on Maxalt and propanolol. Erenumab-aoee 70 mg sub q once every 30 days. Worse after pregnancy and breastfeeding. Usually gets them around her cycle. Last month she had a migraine of her cycle. This month, she has had only one migraine on second day of her cycle and another migraine in between her cycle. She will pick up the coupon for the new medication and start it soon.   Left Arm Pain: symptom onset two weeks ago. Feels like she pulled a muscle. Dull ache constant. Throbbing pain when she moves it. Does not radiate to her hand and fingers. She has De Quervain's tenosynovitis in both wrist which does cause numbness  and tingling.She does not play tennis or golf. Has tried Ice/heat.   Depression: diagnosed several years ago. Currently managed on Prozac 20 mg once daily. No concerns today.   Mirena: placed in 2022. Would like a new gyn closer to home.   Outpatient Encounter Medications as of 09/10/2023  Medication Sig   cyclobenzaprine (FLEXERIL) 5 MG tablet Take 1 tablet (5 mg total) by mouth 3 (three) times daily as needed.   Erenumab-aooe (AIMOVIG) 70 MG/ML SOAJ Inject 70 mg into the skin every 30 (thirty) days.   FLUoxetine (PROZAC) 20 MG capsule Take 2 capsules by mouth daily.   levonorgestrel (MIRENA)  20 MCG/DAY IUD 1 each by Intrauterine route once.   predniSONE (STERAPRED UNI-PAK 21 TAB) 10 MG (21) TBPK tablet Please follow instructions on the pack.   propranolol ER (INDERAL LA) 80 MG 24 hr capsule Take 1 capsule (80 mg total) by mouth daily.   rizatriptan (MAXALT-MLT) 10 MG disintegrating tablet Take 1 tablet (10 mg total) by mouth as needed for migraine. May repeat in 2 hours if needed   eletriptan (RELPAX) 40 MG tablet Take 1 tablet (40 mg total) by mouth as needed for migraine or headache. May repeat in 2 hours if headache persists or recurs. (Patient not taking: Reported on 09/10/2023)   [DISCONTINUED] Prenatal Vit-Fe Fumarate-FA (MULTIVITAMIN-PRENATAL) 27-0.8 MG TABS tablet Take 1 tablet by mouth daily at 12 noon. (Patient not taking: Reported on 08/20/2023)   No facility-administered encounter medications on file as of 09/10/2023.    Past Medical History:  Diagnosis Date   Anxiety    Bartholin cyst    Chronic headaches    Since the age of 62, severe in the past.   De Quervain's tenosynovitis, bilateral 11/10/2021   Depression    Hypertension    Lumbar degenerative disc disease 11/10/2021   Migraines    Mild sleep apnea 12/05/2015   AHI of 5.4. Sleep study 2017.    AHI of 6.2 03/2021. HST     Warts     History reviewed. No pertinent surgical history.  Family History  Problem Relation Age of Onset   Miscarriages / Stillbirths Mother    Hypertension Mother    Hypertension Father    Sleep apnea Father    Bipolar disorder Maternal Aunt    Hypertension Maternal Grandmother    Hypertension Maternal Grandfather    Lung cancer Maternal Grandfather    Arthritis Paternal Grandmother    Hypertension Paternal Grandmother    Heart disease Paternal Grandfather    Arthritis Paternal Grandfather    Hypertension Paternal Grandfather    Asthma Other        aunt    Social History   Socioeconomic History   Marital status: Married    Spouse name: Not on file   Number of  children: Not on file   Years of education: Not on file   Highest education level: Not on file  Occupational History   Occupation: server  Tobacco Use   Smoking status: Never   Smokeless tobacco: Never  Vaping Use   Vaping status: Never Used  Substance and Sexual Activity   Alcohol use: Yes    Alcohol/week: 2.0 standard drinks of alcohol    Types: 2 Standard drinks or equivalent per week   Drug use: No   Sexual activity: Yes    Partners: Male    Birth control/protection: I.U.D.  Other Topics Concern   Not on file  Social History Narrative   Not on file   Social Drivers of Health   Financial Resource Strain: Not on file  Food Insecurity: Not on file  Transportation Needs: Not on file  Physical Activity: Not on file  Stress: Not on file  Social Connections: Not on file  Intimate Partner Violence: Not on file    Review of Systems  Constitutional:  Negative for chills and fever.  Respiratory:  Negative for shortness of breath.   Cardiovascular:  Negative for chest pain.  Gastrointestinal:  Negative for abdominal pain, constipation, diarrhea, heartburn, nausea and vomiting.  Genitourinary:  Negative for dysuria, frequency and urgency.  Musculoskeletal:  Positive for joint pain.       Left arm pain   Neurological:  Positive for headaches. Negative for dizziness.  Endo/Heme/Allergies:  Negative for polydipsia.  Psychiatric/Behavioral:  Negative for depression and suicidal ideas. The patient is not nervous/anxious.         Objective    BP 132/74 (BP Location: Left Arm, Patient Position: Sitting, Cuff Size: Large)   Pulse 75   Temp 98 F (36.7 C) (Oral)   Ht 5\' 6"  (1.676 m)   Wt 256 lb (116.1 kg)   LMP 09/06/2023   SpO2 98%   BMI 41.32 kg/m   Physical Exam Vitals and nursing note reviewed.  Constitutional:      Appearance: Normal appearance.  Cardiovascular:     Rate and Rhythm: Normal rate and regular rhythm.     Pulses: Normal pulses.     Heart sounds:  Normal heart sounds.  Pulmonary:     Effort: Pulmonary effort is normal.     Breath sounds: Normal breath sounds.  Musculoskeletal:        General: Tenderness present. No swelling, deformity or signs of injury.     Right elbow: No swelling, deformity, effusion or lacerations. Normal range of motion.     Left elbow: No swelling, deformity, effusion or lacerations. Decreased range of motion.     Right lower leg: No edema.     Left lower leg: No edema.  Neurological:  General: No focal deficit present.     Mental Status: She is alert and oriented to person, place, and time.     Cranial Nerves: Cranial nerves 2-12 are intact.     Gait: Gait is intact.  Psychiatric:        Mood and Affect: Mood normal.        Behavior: Behavior normal.        Thought Content: Thought content normal.        Judgment: Judgment normal.         Assessment & Plan:  Establishing care with new doctor, encounter for Assessment & Plan: EMR reviewed briefly.   Arm pain, medial, left Assessment & Plan: Differentials include muscle strain, arthritis, elbow tendinopathy   Discussed treatment options.  Rx sent for prednisone pack and flexeril.   She will update in 2 weeks. If not better, consider imaging.  Orders: -     Cyclobenzaprine HCl; Take 1 tablet (5 mg total) by mouth 3 (three) times daily as needed.  Dispense: 30 tablet; Refill: 0 -     predniSONE; Please follow instructions on the pack.  Dispense: 21 tablet; Refill: 0  Encounter for management of intrauterine contraceptive device (IUD), unspecified IUD management type Assessment & Plan: Referral placed for GYN due to location.  Orders: -     Ambulatory referral to Obstetrics / Gynecology  Benign essential hypertension Assessment & Plan: Slightly elevated.  Continue Propanolol.   Mild episode of recurrent major depressive disorder [facility]) Assessment & Plan: Controlled.  Continue prozac once daily.      09/10/2023   12:08 PM  08/20/2023    3:40 PM 12/07/2022    3:37 PM 05/24/2022   12:37 PM 03/14/2021    9:44 AM  Depression screen PHQ 2/9  Decreased Interest 0 1 0 0 0  Down, Depressed, Hopeless 0 1 0 0 0  PHQ - 2 Score 0 2 0 0 0  Altered sleeping 0 0  2 0  Tired, decreased energy 0 1  2 2   Change in appetite 0 0  2 0  Feeling bad or failure about yourself  0 0  0 0  Trouble concentrating 0 0  0 0  Moving slowly or fidgety/restless 0 0  0 0  Suicidal thoughts 0 0  0 0  PHQ-9 Score 0 3  6 2   Difficult doing work/chores Not difficult at all Not difficult at all  Somewhat difficult       Migraine without aura and with status migrainosus, not intractable Assessment & Plan: Uncontrolled.  Start Erenumab-aooe 70 mg sub q once monthly.  Continue Propanolol and Maxalt.   Follow up in 3 months after starting the sub q injection.   Consider neurology referral.      Return in about 3 months (around 12/08/2023) for migraine follow up / physical/ labs.[place] Charon, NP

## 2023-09-10 NOTE — Assessment & Plan Note (Addendum)
Uncontrolled.  Start Erenumab-aooe 70 mg sub q once monthly.  Continue Propanolol and Maxalt.   Follow up in 3 months after starting the sub q injection.   Consider neurology referral.

## 2023-09-25 ENCOUNTER — Other Ambulatory Visit: Payer: Self-pay | Admitting: Family Medicine

## 2023-10-04 ENCOUNTER — Encounter: Payer: Self-pay | Admitting: Obstetrics

## 2023-10-04 ENCOUNTER — Ambulatory Visit: Payer: Commercial Managed Care - PPO | Admitting: Obstetrics

## 2023-10-04 VITALS — BP 141/90 | Ht 65.5 in | Wt 257.0 lb

## 2023-10-04 DIAGNOSIS — Z7689 Persons encountering health services in other specified circumstances: Secondary | ICD-10-CM

## 2023-10-04 DIAGNOSIS — N939 Abnormal uterine and vaginal bleeding, unspecified: Secondary | ICD-10-CM

## 2023-10-04 DIAGNOSIS — Z01419 Encounter for gynecological examination (general) (routine) without abnormal findings: Secondary | ICD-10-CM | POA: Diagnosis not present

## 2023-10-04 DIAGNOSIS — R399 Unspecified symptoms and signs involving the genitourinary system: Secondary | ICD-10-CM | POA: Diagnosis not present

## 2023-10-04 DIAGNOSIS — M6289 Other specified disorders of muscle: Secondary | ICD-10-CM

## 2023-10-04 DIAGNOSIS — Z30431 Encounter for routine checking of intrauterine contraceptive device: Secondary | ICD-10-CM

## 2023-10-04 LAB — POCT URINALYSIS DIPSTICK
Bilirubin, UA: NEGATIVE
Glucose, UA: NEGATIVE
Ketones, UA: NEGATIVE
Leukocytes, UA: NEGATIVE
Nitrite, UA: NEGATIVE
Protein, UA: NEGATIVE
Spec Grav, UA: 1.015 (ref 1.010–1.025)
Urobilinogen, UA: 0.2 U/dL
pH, UA: 7 (ref 5.0–8.0)

## 2023-10-04 NOTE — Progress Notes (Signed)
 ANNUAL GYNECOLOGICAL EXAM  SUBJECTIVE  HPI  Sarah Gonzales is a 33 y.o.-year-old G1P1001 who presents for an annual gynecological exam today.  She has ha a [employer] for 2 years, and she reports that recently her periods have become heavier. Last month was the heaviest period she has had. She also has noticed increased pelvic pain. She denies abnormal discharge and dyspareunia. She has noticed an increase in her urge/stress incontinence as well as bladder discomfort over the past month.  Medical/Surgical History Past Medical History:  Diagnosis Date   Anxiety    Bartholin cyst    Chronic headaches    Since the age of 60, severe in the past.   De Quervain's tenosynovitis, bilateral 11/10/2021   Depression    Hypertension    Lumbar degenerative disc disease 11/10/2021   Migraines    Mild sleep apnea 12/05/2015   AHI of 5.4. Sleep study 2017.    AHI of 6.2 03/2021. HST     Warts    History reviewed. No pertinent surgical history.  Social History Lives with husband and son. Parents live in RV  in backyard. Feels safe there Work: [employer] - looking for work Exercise: walking, exercise videos Substances: Occasional EtOH; denies tobacco, vape, and recreational drugs  Obstetric History OB History     Gravida  1   Para  1   Term  1   Preterm  0   AB  0   Living  1      SAB  0   IAB  0   Ectopic  0   Multiple  0   Live Births  1            GYN/Menstrual History Patient's last menstrual period was 09/06/2023. regular periods every month  Last Pap: 06/2021. NILM, negative HPV Contraception: IUD  Prevention Endorses regular dental and eye exams Mammogram: at 40 Colonoscopy: at 68  Current Medications Outpatient Medications Prior to Visit  Medication Sig   cyclobenzaprine (FLEXERIL) 5 MG tablet Take 1 tablet (5 mg total) by mouth 3 (three) times daily as needed.   Erenumab-aooe (AIMOVIG) 70 MG/ML SOAJ Inject 70 mg into the skin every 30 (thirty) days.    FLUoxetine (PROZAC) 20 MG capsule Take 2 capsules by mouth daily.   predniSONE (STERAPRED UNI-PAK 21 TAB) 10 MG (21) TBPK tablet Please follow instructions on the pack.   propranolol ER (INDERAL LA) 80 MG 24 hr capsule Take 1 capsule (80 mg total) by mouth daily.   rizatriptan (MAXALT-MLT) 10 MG disintegrating tablet Take 1 tablet (10 mg total) by mouth as needed for migraine. May repeat in 2 hours if needed   eletriptan (RELPAX) 40 MG tablet Take 1 tablet (40 mg total) by mouth as needed for migraine or headache. May repeat in 2 hours if headache persists or recurs. (Patient not taking: Reported on 09/10/2023)   levonorgestrel (MIRENA) 20 MCG/DAY IUD 1 each by Intrauterine route once.   No facility-administered medications prior to visit.       ROS Constitutional: Denied constitutional symptoms, night sweats, recent illness, fatigue, fever, insomnia and weight loss.  Eyes: Denied eye symptoms, eye pain, photophobia, vision change and visual disturbance.  Ears/Nose/Throat/Neck: Denied ear, nose, throat or neck symptoms, hearing loss, nasal discharge, sinus congestion and sore throat.  Cardiovascular: Denied cardiovascular symptoms, arrhythmia, chest pain/pressure, edema, exercise intolerance, orthopnea and palpitations.  Respiratory: Denied pulmonary symptoms, asthma, pleuritic pain, productive sputum, cough, dyspnea and wheezing.  Gastrointestinal: Denied, gastro-esophageal reflux, melena,  nausea and vomiting.  Genitourinary: Increased urinary incontinence, feeling of fullness  Musculoskeletal: Denied musculoskeletal symptoms, stiffness, swelling, muscle weakness and myalgia.  Dermatologic: Denied dermatology symptoms, rash and scar.  Neurologic: Denied neurology symptoms, dizziness, headache, neck pain and syncope.  Psychiatric: Denied psychiatric symptoms, anxiety and depression.  Endocrine: Denied endocrine symptoms including hot flashes and night sweats.    OBJECTIVE  LMP 09/06/2023     Physical examination General NAD, Conversant  HEENT Atraumatic; Op clear with mmm.  Normo-cephalic. Pupils reactive. Anicteric sclerae  Thyroid/Neck Smooth without nodularity or enlargement. Normal ROM.  Neck Supple.  Skin No rashes, lesions or ulceration. Normal palpated skin turgor. No nodularity.  Breasts: No masses or discharge.  Symmetric.  No axillary adenopathy.  Lungs: Clear to auscultation.No rales or wheezes. Normal Respiratory effort, no retractions.  Heart: NSR.  No murmurs or rubs appreciated. No peripheral edema  Abdomen: Soft.  Non-tender.  No masses.  No HSM. No hernia  Extremities: Moves all appropriately.  Normal ROM for age. No lymphadenopathy.  Neuro: Oriented to PPT.  Normal mood. Normal affect.     Pelvic:   Vulva: Normal appearance.  No lesions.  Vagina: No lesions or abnormalities noted.  Support: Normal pelvic support.  Urethra No masses tenderness or scarring.  Meatus Normal size without lesions or prolapse.  Cervix: Normal appearance.  No lesions. IUD strings visible, approximately 4-5 cm  Perineum: Normal exam.  No lesions.    ASSESSMENT  1) Annual exam 2) Increased menstrual bleeding 3) Urge urinary incontinence/bladder discomfort 4) Elevated BP  PLAN 1) Physical exam as noted. Discussed healthy lifestyle choices and preventive care. Declines STI testing. Routine labs done with PCP. Pap due 06/2025 2) UC collected today. Referral to pelvic PT. 3) Discussed increased risk for chronic HTN that     Return in one year for annual exam or as needed for concerns.   Guadlupe Spanish, CNM

## 2023-10-06 ENCOUNTER — Encounter: Payer: Self-pay | Admitting: Obstetrics

## 2023-10-06 LAB — URINE CULTURE

## 2023-10-14 ENCOUNTER — Other Ambulatory Visit: Payer: Self-pay | Admitting: Medical-Surgical

## 2023-10-16 ENCOUNTER — Ambulatory Visit

## 2023-10-16 ENCOUNTER — Other Ambulatory Visit: Payer: Self-pay | Admitting: Obstetrics

## 2023-10-16 ENCOUNTER — Ambulatory Visit: Admitting: Certified Nurse Midwife

## 2023-10-16 VITALS — BP 136/93 | HR 75 | Ht 66.0 in | Wt 259.8 lb

## 2023-10-16 DIAGNOSIS — N939 Abnormal uterine and vaginal bleeding, unspecified: Secondary | ICD-10-CM

## 2023-10-16 DIAGNOSIS — Z975 Presence of (intrauterine) contraceptive device: Secondary | ICD-10-CM

## 2023-10-16 DIAGNOSIS — Z30431 Encounter for routine checking of intrauterine contraceptive device: Secondary | ICD-10-CM

## 2023-10-16 DIAGNOSIS — Z7689 Persons encountering health services in other specified circumstances: Secondary | ICD-10-CM

## 2023-10-16 DIAGNOSIS — M6289 Other specified disorders of muscle: Secondary | ICD-10-CM

## 2023-10-16 DIAGNOSIS — Z30432 Encounter for removal of intrauterine contraceptive device: Secondary | ICD-10-CM

## 2023-10-16 DIAGNOSIS — R399 Unspecified symptoms and signs involving the genitourinary system: Secondary | ICD-10-CM

## 2023-10-16 MED ORDER — ANNOVERA 0.013-0.15 MG/24HR VA RING: 1.0000 | VAGINAL_RING | VAGINAL | 1 refills | Status: DC

## 2023-10-16 NOTE — Progress Notes (Signed)
    GYNECOLOGY CLINIC PROCEDURE NOTE  Ms. Sarah Gonzales is a 33 y.o. G1P1001 here for  IUD removal. No GYN concerns.  Last pap smear was on  and was normal.  IUD Removal  Patient was in the dorsal lithotomy position, normal external genitalia was noted.  A speculum was placed in the patient's vagina, normal discharge was noted, no lesions. The multiparous cervix was visualized, no lesions, no abnormal discharge.  The strings of the IUD were grasped and pulled using ring forceps. The IUD was removed in its entirety.  (The strings of the IUD were not visualized, so Kelly forceps were introduced into the endometrial cavity and the IUD was grasped and removed in its entirety).  Patient tolerated the procedure well.    Patient will use annovera for contraception..  Routine preventative health maintenance measures emphasized.  Rumi Kolodziej, Irving Burton, [place] 10/16/2023 2:01 PM

## 2023-10-17 ENCOUNTER — Other Ambulatory Visit: Payer: Self-pay

## 2023-10-17 ENCOUNTER — Ambulatory Visit: Attending: Obstetrics

## 2023-10-17 DIAGNOSIS — M5459 Other low back pain: Secondary | ICD-10-CM

## 2023-10-17 DIAGNOSIS — R293 Abnormal posture: Secondary | ICD-10-CM | POA: Diagnosis present

## 2023-10-17 DIAGNOSIS — R102 Pelvic and perineal pain: Secondary | ICD-10-CM

## 2023-10-17 DIAGNOSIS — R279 Unspecified lack of coordination: Secondary | ICD-10-CM | POA: Diagnosis present

## 2023-10-17 DIAGNOSIS — M6289 Other specified disorders of muscle: Secondary | ICD-10-CM | POA: Insufficient documentation

## 2023-10-17 DIAGNOSIS — M6281 Muscle weakness (generalized): Secondary | ICD-10-CM | POA: Diagnosis present

## 2023-10-17 DIAGNOSIS — M62838 Other muscle spasm: Secondary | ICD-10-CM | POA: Diagnosis present

## 2023-10-17 NOTE — Therapy (Signed)
 OUTPATIENT PHYSICAL THERAPY FEMALE PELVIC EVALUATION   Patient Name: Sarah Gonzales MRN: [id number] DOB:1991-06-13, 33 y.o., female Today's Date: 10/17/2023  END OF SESSION:  PT End of Session - 10/17/23 0847     Visit Number 1    Date for PT Re-Evaluation 04/02/24    Authorization Type [employer]    PT Start Time 0845    PT Stop Time 0925    PT Time Calculation (min) 40 min    Activity Tolerance Patient tolerated treatment well    Behavior During Therapy [facility] for tasks assessed/performed             Past Medical History:  Diagnosis Date   Anxiety    Bartholin cyst    Chronic headaches    Since the age of 25, severe in the past.   De Quervain's tenosynovitis, bilateral 11/10/2021   Depression    Hypertension    Lumbar degenerative disc disease 11/10/2021   Migraines    Mild sleep apnea 12/05/2015   AHI of 5.4. Sleep study 2017.    AHI of 6.2 03/2021. HST     Warts    History reviewed. No pertinent surgical history. Patient Active Problem List   Diagnosis Date Noted   Encounter for IUD removal 09/10/2023   Encounter for management of intrauterine contraceptive device (IUD) 09/10/2023   Arm pain, medial, left 09/10/2023   Benign essential hypertension 04/11/2021   Major depression, recurrent (HCC) 04/11/2021   BMI 40.0-44.9, adult (HCC) 12/14/2015   Migraine headache 07/07/2008    PCP: Modesto Charon, NP  REFERRING PROVIDER: Glenetta Borg, CNM   REFERRING DIAG: [phone] (ICD-10-CM) - Pelvic floor dysfunction  THERAPY DIAG:  Other muscle spasm  Muscle weakness (generalized)  Unspecified lack of coordination  Pelvic pain  Other low back pain  Abnormal posture  Rationale for Evaluation and Treatment: Rehabilitation  ONSET DATE: 2 years   SUBJECTIVE:                                                                                                                                                                                           SUBJECTIVE  STATEMENT: Pt has recently had increased pelvic pain and heavy menstrual cycles. She had IUD removed yesterday. She is also having increased urge/stress incontinence. She states that these issues started after delivery her 33 year old. She feels like she there is something falling out of her vagina. She has just stopped breast feeding several months ago.  Fluid intake:   PAIN:  Are you having pain? Yes - she states that she has degenerative disc disease NPRS scale: 8/10 Pain location:  low back  pain  Pain type: tight Pain description: constant   Aggravating factors: standing, bending, lifting Relieving factors: sitting, rest  PRECAUTIONS: None  RED FLAGS: None   WEIGHT BEARING RESTRICTIONS: No  FALLS:  Has patient fallen in last 6 months? No  OCCUPATION: not working  ACTIVITY LEVEL : some exercise; walks 1-2 miles at a time   PLOF: Independent  PATIENT GOALS: improve bladder control; strengthen pelvic floor   PERTINENT HISTORY:  Anxiety, bartholin cyst, chronic headaches, HTN, depression, mild sleep apnea, anal fissure, degenerative disc disease    BOWEL MOVEMENT: Pain with bowel movement: No - history of anal fissure  Type of bowel movement:Frequency 1x/day and Strain none Fully empty rectum: Yes:   Leakage: No Pads: Yes: see below Fiber supplement/laxative No  URINATION: Pain with urination: No Fully empty bladder: No Stream: Strong Urgency: Yes  Frequency: every 1-2 hours; 1-2x/night Leakage: Urge to void, Walking to the bathroom, Coughing, Sneezing, and Laughing Pads: Yes: 2-3 a day  INTERCOURSE:  Ability to have vaginal penetration Yes  Pain with intercourse: none DrynessNo Climax: WNL Marinoff Scale: 0/3   PREGNANCY: Vaginal deliveries 1 Tearing Yes: 1 suture Episiotomy No C-section deliveries 0 Currently pregnant No  PROLAPSE: Pressure and Bulge   OBJECTIVE:  Note: Objective measures were completed at Evaluation unless otherwise  noted.  10/17/23:  PATIENT SURVEYS:   PFIQ-7: 48  COGNITION: Overall cognitive status: Within functional limits for tasks assessed     SENSATION: Light touch: Appears intact   FUNCTIONAL TESTS:  Squat: WNL Single leg stance:  Rt: pelvic drop  Lt: pelvic drop Curl-up test: upper abdominal distortion    GAIT: Assistive device utilized: None Comments: WNL  POSTURE: rounded shoulders, forward head, decreased lumbar lordosis, increased thoracic kyphosis, and posterior pelvic tilt, and elevated Rt shoulder/Lt iliac crest   LUMBARAROM/PROM:  A/PROM A/PROM  Eval (% available)  Flexion 50  Extension 25  Right lateral flexion 50  Left lateral flexion 50  Right rotation 50  Left rotation 50   (Blank rows = not tested)  PALPATION:   General: tightness in bil lumbar paraspinals   Pelvic Alignment: WNL  Abdominal: decreased rib cage mobility                External Perineal Exam: dryness                             Internal Pelvic Floor: dryness, significant tension and tenderness reproducing pain/pressure, Lt>Rt  Patient confirms identification and approves PT to assess internal pelvic floor and treatment Yes  PELVIC MMT:   MMT eval  Vaginal 1/5, 2 seconds, 5 repeat contractions  Diastasis Recti 2 finger width separation  (Blank rows = not tested)        TONE: High, Lt>Rt - reproduced sensation of pressure/bulge  PROLAPSE: None detected in supine today - possible difficulty with coordinating due to high tension  TODAY'S TREATMENT:  DATE:  10/17/23  EVAL  Neuromuscular re-education: Pt provides verbal consent for internal vaginal/rectal pelvic floor exam. Internal vaginal pelvic floor muscle contraction training Urge dirll The knack Down training Diaphragmatic breathing  Cat cow Child's pose Happy baby Butterfly Double voiding      PATIENT EDUCATION:  Education details: See above Person educated: Patient Education method: Explanation, Demonstration, Tactile cues, Verbal cues, and Handouts Education comprehension: verbalized understanding  HOME EXERCISE PROGRAM: [id number]  ASSESSMENT:  CLINICAL IMPRESSION: Patient is a 33 y.o. female who was seen today for physical therapy evaluation and treatment for pelvic pressure and urinary incontinence. Exam findings notable for decreased lumbar A/ROM, abnormal posture, functional core weakness with pelvic drop in single leg stance, abdominal distortion with curl-up test, 2 finger width diastasis, significant tone in pelvic floor muscles with tenderness and reproduction of pelvic pressure, pelvic floor muscle weakness and decreased endurance, decreased pelvic floor muscle coordination. Signs and symptoms are most consistent with high tone pelvic floor muscles and pelvic floor muscle weakness/decreased coordination; we discussed how this is leading to her symptoms. Initial treatment consisted of urge drill, the knack, double voiding, diaphragmatic breathing, and down training activities for improved relaxation. She will continue to benefit from skilled PT intervention in order to decrease pelvic floor muscle tone, perform pelvic floor muscle strengthening, improve bladder control, and begin/progress functional core strengthening program.   OBJECTIVE IMPAIRMENTS: decreased activity tolerance, decreased coordination, decreased endurance, decreased mobility, decreased ROM, decreased strength, increased fascial restrictions, increased muscle spasms, impaired tone, postural dysfunction, and pain.   ACTIVITY LIMITATIONS: lifting, bending, standing, squatting, continence, and locomotion level  PARTICIPATION LIMITATIONS: cleaning, laundry, driving, community activity, and occupation  PERSONAL FACTORS: 1 comorbidity: medical history  are also affecting patient's functional outcome.    REHAB POTENTIAL: Good  CLINICAL DECISION MAKING: Stable/uncomplicated  EVALUATION COMPLEXITY: Low   GOALS: Goals reviewed with patient? Yes  SHORT TERM GOALS: Target date: 11/14/2023    Pt will be independent with HEP.   Baseline: Goal status: INITIAL  2.  Pt will be independent with the knack, urge suppression technique, and double voiding in order to improve bladder habits and decrease urinary incontinence.   Baseline:  Goal status: INITIAL  3.  Pt will be able to correctly perform diaphragmatic breathing and appropriate pressure management in order to prevent worsening vaginal wall laxity and improve pelvic floor A/ROM.   Baseline:  Goal status: INITIAL  4.  Pt will report 25% improvement in urinary incontinence.  Baseline:  Goal status: INITIAL  5.  Pt will be independent with diaphragmatic breathing and down training activities in order to improve pelvic floor relaxation.  Baseline:  Goal status: INITIAL   LONG TERM GOALS: Target date: 10/01/23  Pt will be independent with advanced HEP.   Baseline:  Goal status: INITIAL  2.  Pt will report 75% improvement in urinary incontinence.  Baseline:  Goal status: INITIAL  3.  Pt will report no leaks with laughing, coughing, sneezing in order to improve comfort with interpersonal relationships and community activities.   Baseline:  Goal status: INITIAL  4.  Pt will be able to go 2-3 hours in between voids without urgency or incontinence in order to improve QOL and perform all functional activities with less difficulty.   Baseline:  Goal status: INITIAL  5.  Pt will present with normalized pelvic floor muscle tone and no tenderness.  Baseline:  Goal status: INITIAL  6.  Pt will demonstrate normal pelvic floor muscle tone and A/ROM, able  to achieve 4/5 strength with contractions and 10 sec endurance, in order to provide appropriate lumbopelvic support in functional activities.   Baseline:  Goal status:  INITIAL  PLAN:  PT FREQUENCY: 1-2x/week  PT DURATION: 6 months  PLANNED INTERVENTIONS: 97110-Therapeutic exercises, 97530- Therapeutic activity, [id number]- Neuromuscular re-education, 97535- Self Care, [id number]- Manual therapy, Dry Needling, and Biofeedback  PLAN FOR NEXT SESSION: Progress down training; pelvic floor muscle release- discuss wand; mobility exercises.    Julio Alm, PT, DPT03/20/259:57 AM

## 2023-10-17 NOTE — Patient Instructions (Signed)
 The knack: Use this technique while coughing, laughing, sneezing, or with any activities that causes you to leak urine a little. Right before you perform one of these activities that increase pressure in the abdomen and pushes a little urine out, perform a pelvic floor muscle contraction and hold. If that does not completely stop the leaking, try tightening your thighs together in addition to performing a pelvic floor muscle contraction. Make sure you are not trying to stifle a cough, sneeze, or laugh; allow these activities in full as it will cause less pressure down into the bladder and pelvic floor muscles.    Urge Incontinence  Ideal urination frequency is every 2-4 wakeful hours, which equates to 5-8 times within a 24-hour period.   Urge incontinence is leakage that occurs when the bladder muscle contracts, creating a sudden need to go before getting to the bathroom.   Going too often when your bladder isn't actually full can disrupt the body's automatic signals to store and hold urine longer, which will increase urgency/frequency.  In this case, the bladder "is running the show" and strategies can be learned to retrain this pattern.   One should be able to control the first urge to urinate, at around .  The bladder can hold up to a "grande latte," or . To help you gain control, practice the Urge Drill below when urgency strikes.  This drill will help retrain your bladder signals and allow you to store and hold urine longer.  The overall goal is to stretch out your time between voids to reach a more manageable voiding schedule.    Practice your "quick flicks" often throughout the day (each waking hour) even when you don't need feel the urge to go.  This will help strengthen your pelvic floor muscles, making them more effective in controlling leakage.  Urge Drill  When you feel an urge to go, follow these steps to regain control: Stop what you are doing and be still Take one  deep breath, directing your air into your abdomen Think an affirming thought, such as "I've got this." Do 5 quick flicks of your pelvic floor Walk with control to the bathroom to void, or delay voiding     Double-voiding:  This technique is to help with post-void dribbling, or leaking a little bit when you stand up right after urinating. Use relaxed toileting mechanics to urinate as much as you feel like you have to without straining. Sit back upright from leaning forward and relax this way for 10-20 seconds. Lean forward again to finish voiding any amount more.   [facility] [address] [place], [place] [address] Phone # [phone] Fax [phone]

## 2023-10-18 ENCOUNTER — Ambulatory Visit: Payer: Commercial Managed Care - PPO | Admitting: Family Medicine

## 2023-10-28 ENCOUNTER — Ambulatory Visit

## 2023-10-28 DIAGNOSIS — R293 Abnormal posture: Secondary | ICD-10-CM

## 2023-10-28 DIAGNOSIS — M62838 Other muscle spasm: Secondary | ICD-10-CM | POA: Diagnosis not present

## 2023-10-28 DIAGNOSIS — M5459 Other low back pain: Secondary | ICD-10-CM

## 2023-10-28 DIAGNOSIS — M6281 Muscle weakness (generalized): Secondary | ICD-10-CM

## 2023-10-28 DIAGNOSIS — R102 Pelvic and perineal pain: Secondary | ICD-10-CM

## 2023-10-28 DIAGNOSIS — R279 Unspecified lack of coordination: Secondary | ICD-10-CM

## 2023-10-28 NOTE — Therapy (Signed)
 OUTPATIENT PHYSICAL THERAPY FEMALE PELVIC TREATMENT   Patient Name: Sarah Gonzales MRN: [id number] DOB:January 03, 1991, 33 y.o., female Today's Date: 10/28/2023  END OF SESSION:  PT End of Session - 10/28/23 0930     Visit Number 2    Date for PT Re-Evaluation 04/02/24    Authorization Type [employer]    PT Start Time 0930    PT Stop Time 1010    PT Time Calculation (min) 40 min    Activity Tolerance Patient tolerated treatment well    Behavior During Therapy WFL for tasks assessed/performed             Past Medical History:  Diagnosis Date   Anxiety    Bartholin cyst    Chronic headaches    Since the age of 89, severe in the past.   De Quervain's tenosynovitis, bilateral 11/10/2021   Depression    Hypertension    Lumbar degenerative disc disease 11/10/2021   Migraines    Mild sleep apnea 12/05/2015   AHI of 5.4. Sleep study 2017.    AHI of 6.2 03/2021. HST     Warts    History reviewed. No pertinent surgical history. Patient Active Problem List   Diagnosis Date Noted   Encounter for IUD removal 09/10/2023   Encounter for management of intrauterine contraceptive device (IUD) 09/10/2023   Arm pain, medial, left 09/10/2023   Benign essential hypertension 04/11/2021   Major depression, recurrent (HCC) 04/11/2021   BMI 40.0-44.9, adult (HCC) 12/14/2015   Migraine headache 07/07/2008    PCP: Modesto Charon, NP  REFERRING PROVIDER: Glenetta Borg, CNM   REFERRING DIAG: [phone] (ICD-10-CM) - Pelvic floor dysfunction  THERAPY DIAG:  Other muscle spasm  Muscle weakness (generalized)  Unspecified lack of coordination  Pelvic pain  Other low back pain  Abnormal posture  Rationale for Evaluation and Treatment: Rehabilitation  ONSET DATE: 2 years   SUBJECTIVE:                                                                                                                                                                                           SUBJECTIVE  STATEMENT: Pt states that double voiding was very helpful. She has been trying to work on other exercises on regular basis, but sometimes feels like thinking about relaxing pelvic floor makes her hyper focused. She feels like stretches help with low back pain a little bit. She notices the most pain when lying flat on her back.   PAIN: 10/28/23 Are you having pain? Yes - she states that she has degenerative disc disease NPRS scale: 8/10 Pain location:  low back pain  Pain  type: tight Pain description: constant   Aggravating factors: standing, bending, lifting Relieving factors: sitting, rest  PRECAUTIONS: None  RED FLAGS: None   WEIGHT BEARING RESTRICTIONS: No  FALLS:  Has patient fallen in last 6 months? No  OCCUPATION: not working  ACTIVITY LEVEL : some exercise; walks 1-2 miles at a time   PLOF: Independent  PATIENT GOALS: improve bladder control; strengthen pelvic floor   PERTINENT HISTORY:  Anxiety, bartholin cyst, chronic headaches, HTN, depression, mild sleep apnea, anal fissure, degenerative disc disease    BOWEL MOVEMENT: Pain with bowel movement: No - history of anal fissure  Type of bowel movement:Frequency 1x/day and Strain none Fully empty rectum: Yes:   Leakage: No Pads: Yes: see below Fiber supplement/laxative No  URINATION: Pain with urination: No Fully empty bladder: No Stream: Strong Urgency: Yes  Frequency: every 1-2 hours; 1-2x/night Leakage: Urge to void, Walking to the bathroom, Coughing, Sneezing, and Laughing Pads: Yes: 2-3 a day  INTERCOURSE:  Ability to have vaginal penetration Yes  Pain with intercourse: none DrynessNo Climax: WNL Marinoff Scale: 0/3   PREGNANCY: Vaginal deliveries 1 Tearing Yes: 1 suture Episiotomy No C-section deliveries 0 Currently pregnant No  PROLAPSE: Pressure and Bulge   OBJECTIVE:  Note: Objective measures were completed at Evaluation unless otherwise noted.  10/17/23:  PATIENT SURVEYS:    PFIQ-7: 48  COGNITION: Overall cognitive status: Within functional limits for tasks assessed     SENSATION: Light touch: Appears intact   FUNCTIONAL TESTS:  Squat: WNL Single leg stance:  Rt: pelvic drop  Lt: pelvic drop Curl-up test: upper abdominal distortion    GAIT: Assistive device utilized: None Comments: WNL  POSTURE: rounded shoulders, forward head, decreased lumbar lordosis, increased thoracic kyphosis, and posterior pelvic tilt, and elevated Rt shoulder/Lt iliac crest   LUMBARAROM/PROM:  A/PROM A/PROM  Eval (% available)  Flexion 50  Extension 25  Right lateral flexion 50  Left lateral flexion 50  Right rotation 50  Left rotation 50   (Blank rows = not tested)  PALPATION:   General: tightness in bil lumbar paraspinals   Pelvic Alignment: WNL  Abdominal: decreased rib cage mobility                External Perineal Exam: dryness                             Internal Pelvic Floor: dryness, significant tension and tenderness reproducing pain/pressure, Lt>Rt  Patient confirms identification and approves PT to assess internal pelvic floor and treatment Yes  PELVIC MMT:   MMT eval  Vaginal 1/5, 2 seconds, 5 repeat contractions  Diastasis Recti 2 finger width separation  (Blank rows = not tested)        TONE: High, Lt>Rt - reproduced sensation of pressure/bulge  PROLAPSE: None detected in supine today - possible difficulty with coordinating due to high tension  TODAY'S TREATMENT:  DATE:  10/28/23 Manual: Pt provides verbal consent for internal vaginal/rectal pelvic floor exam. Internal pelvic floor muscle release to bil levator ani, Lt>Rt Trigger Point Dry Needling  Initial Treatment: Pt instructed on Dry Needling rational, procedures, and possible side effects. Pt instructed to expect mild to moderate muscle soreness  later in the day and/or into the next day.  Pt instructed in methods to reduce muscle soreness. Pt instructed to continue prescribed HEP. Patient was educated on signs and symptoms of infection and other risk factors and advised to seek medical attention should they occur.  Patient verbalized understanding of these instructions and education.   Patient Verbal Consent Given: Yes Education Handout Provided: Yes Muscles Treated: Lt glutes Electrical Stimulation Performed: No Treatment Response/Outcome: twitch response/release  Soft tissue mobilization to Lt glutes Exercises: Supine pelvic tilts 2 x 10 Single knee to chest 5x bil Lower trunk rotation 2 x 10 Supine march 2 x 10 Therapeutic activities: Pelvic floor muscle wand education Bed mobility with breathing and core activation training   10/17/23  EVAL  Neuromuscular re-education: Pt provides verbal consent for internal vaginal/rectal pelvic floor exam. Internal vaginal pelvic floor muscle contraction training Urge dirll The knack Down training Diaphragmatic breathing  Cat cow Child's pose Happy baby Butterfly Double voiding     PATIENT EDUCATION:  Education details: See above Person educated: Patient Education method: Explanation, Demonstration, Tactile cues, Verbal cues, and Handouts Education comprehension: verbalized understanding  HOME EXERCISE PROGRAM: [id number]  ASSESSMENT:  CLINICAL IMPRESSION: Due to significant trigger points in Lt glutes, dry needling performed with soft tissue mobilization afterwards to help reduce restriction. She had painful, but overall good tolerance. We also performed pelvic floor muscle release internally with good tolerance and release of muscular tension. She was able to practice diaphragmatic breathing to help further relax pelvic floor muscles and improve awareness of relaxation vs tightness. She still has notable restriction in bil pelvic floor muscles, Lt>Rt, that reproduce  her symptoms. She did have improvement in pain after dry needling and manual techniques with improved ability to lie on her back. We also went over mobility exercises to perform when she's lying on back to help ease the transition into other positions. We discussed coming in for second appointment this week to begin working on core training. She will continue to benefit from skilled PT intervention in order to decrease pelvic floor muscle tone, perform pelvic floor muscle strengthening, improve bladder control, and begin/progress functional core strengthening program.   OBJECTIVE IMPAIRMENTS: decreased activity tolerance, decreased coordination, decreased endurance, decreased mobility, decreased ROM, decreased strength, increased fascial restrictions, increased muscle spasms, impaired tone, postural dysfunction, and pain.   ACTIVITY LIMITATIONS: lifting, bending, standing, squatting, continence, and locomotion level  PARTICIPATION LIMITATIONS: cleaning, laundry, driving, community activity, and occupation  PERSONAL FACTORS: 1 comorbidity: medical history  are also affecting patient's functional outcome.   REHAB POTENTIAL: Good  CLINICAL DECISION MAKING: Stable/uncomplicated  EVALUATION COMPLEXITY: Low   GOALS: Goals reviewed with patient? Yes  SHORT TERM GOALS: Target date: 11/14/2023    Pt will be independent with HEP.   Baseline: Goal status: INITIAL  2.  Pt will be independent with the knack, urge suppression technique, and double voiding in order to improve bladder habits and decrease urinary incontinence.   Baseline:  Goal status: INITIAL  3.  Pt will be able to correctly perform diaphragmatic breathing and appropriate pressure management in order to prevent worsening vaginal wall laxity and improve pelvic floor A/ROM.   Baseline:  Goal  status: INITIAL  4.  Pt will report 25% improvement in urinary incontinence.  Baseline:  Goal status: INITIAL  5.  Pt will be  independent with diaphragmatic breathing and down training activities in order to improve pelvic floor relaxation.  Baseline:  Goal status: INITIAL   LONG TERM GOALS: Target date: 10/01/23  Pt will be independent with advanced HEP.   Baseline:  Goal status: INITIAL  2.  Pt will report 75% improvement in urinary incontinence.  Baseline:  Goal status: INITIAL  3.  Pt will report no leaks with laughing, coughing, sneezing in order to improve comfort with interpersonal relationships and community activities.   Baseline:  Goal status: INITIAL  4.  Pt will be able to go 2-3 hours in between voids without urgency or incontinence in order to improve QOL and perform all functional activities with less difficulty.   Baseline:  Goal status: INITIAL  5.  Pt will present with normalized pelvic floor muscle tone and no tenderness.  Baseline:  Goal status: INITIAL  6.  Pt will demonstrate normal pelvic floor muscle tone and A/ROM, able to achieve 4/5 strength with contractions and 10 sec endurance, in order to provide appropriate lumbopelvic support in functional activities.   Baseline:  Goal status: INITIAL  PLAN:  PT FREQUENCY: 1-2x/week  PT DURATION: 6 months  PLANNED INTERVENTIONS: 97110-Therapeutic exercises, 97530- Therapeutic activity, 97112- Neuromuscular re-education, 97535- Self Care, 16109- Manual therapy, Dry Needling, and Biofeedback  PLAN FOR NEXT SESSION: Progress down training; begin core training.    Julio Alm, PT, DPT03/31/2510:13 AM

## 2023-10-28 NOTE — Patient Instructions (Signed)

## 2023-10-29 ENCOUNTER — Ambulatory Visit: Attending: Obstetrics

## 2023-10-29 DIAGNOSIS — M62838 Other muscle spasm: Secondary | ICD-10-CM | POA: Diagnosis present

## 2023-10-29 DIAGNOSIS — R102 Pelvic and perineal pain: Secondary | ICD-10-CM | POA: Diagnosis present

## 2023-10-29 DIAGNOSIS — R279 Unspecified lack of coordination: Secondary | ICD-10-CM | POA: Diagnosis present

## 2023-10-29 DIAGNOSIS — M5459 Other low back pain: Secondary | ICD-10-CM | POA: Diagnosis present

## 2023-10-29 DIAGNOSIS — R293 Abnormal posture: Secondary | ICD-10-CM | POA: Insufficient documentation

## 2023-10-29 DIAGNOSIS — M6281 Muscle weakness (generalized): Secondary | ICD-10-CM | POA: Insufficient documentation

## 2023-10-29 NOTE — Therapy (Signed)
 OUTPATIENT PHYSICAL THERAPY FEMALE PELVIC TREATMENT   Patient Name: Sarah Gonzales MRN: [id number] DOB:1991-01-05, 33 y.o., female Today's Date: 10/29/2023  END OF SESSION:  PT End of Session - 10/29/23 0932     Visit Number 3    Date for PT Re-Evaluation 04/02/24    Authorization Type [employer]    PT Start Time 0930    PT Stop Time 1010    PT Time Calculation (min) 40 min    Activity Tolerance Patient tolerated treatment well    Behavior During Therapy WFL for tasks assessed/performed             Past Medical History:  Diagnosis Date   Anxiety    Bartholin cyst    Chronic headaches    Since the age of 35, severe in the past.   De Quervain's tenosynovitis, bilateral 11/10/2021   Depression    Hypertension    Lumbar degenerative disc disease 11/10/2021   Migraines    Mild sleep apnea 12/05/2015   AHI of 5.4. Sleep study 2017.    AHI of 6.2 03/2021. HST     Warts    History reviewed. No pertinent surgical history. Patient Active Problem List   Diagnosis Date Noted   Encounter for IUD removal 09/10/2023   Encounter for management of intrauterine contraceptive device (IUD) 09/10/2023   Arm pain, medial, left 09/10/2023   Benign essential hypertension 04/11/2021   Major depression, recurrent (HCC) 04/11/2021   BMI 40.0-44.9, adult (HCC) 12/14/2015   Migraine headache 07/07/2008    PCP: Modesto Charon, NP  REFERRING PROVIDER: Glenetta Borg, CNM   REFERRING DIAG: [phone] (ICD-10-CM) - Pelvic floor dysfunction  THERAPY DIAG:  Other muscle spasm  Muscle weakness (generalized)  Unspecified lack of coordination  Pelvic pain  Other low back pain  Abnormal posture  Rationale for Evaluation and Treatment: Rehabilitation  ONSET DATE: 2 years   SUBJECTIVE:                                                                                                                                                                                           SUBJECTIVE  STATEMENT: Pt states that she was not too sore after manual treatment yesterday. She still has naggin pain, but states that it is not as sharp or severe.   PAIN: 4/1//25 Are you having pain? Yes - she states that she has degenerative disc disease NPRS scale: 7.5-8/10 Pain location:  low back pain  Pain type: tight Pain description: constant   Aggravating factors: standing, bending, lifting Relieving factors: sitting, rest  PRECAUTIONS: None  RED FLAGS: None   WEIGHT BEARING RESTRICTIONS: No  FALLS:  Has patient fallen in last 6 months? No  OCCUPATION: not working  ACTIVITY LEVEL : some exercise; walks 1-2 miles at a time   PLOF: Independent  PATIENT GOALS: improve bladder control; strengthen pelvic floor   PERTINENT HISTORY:  Anxiety, bartholin cyst, chronic headaches, HTN, depression, mild sleep apnea, anal fissure, degenerative disc disease    BOWEL MOVEMENT: Pain with bowel movement: No - history of anal fissure  Type of bowel movement:Frequency 1x/day and Strain none Fully empty rectum: Yes:   Leakage: No Pads: Yes: see below Fiber supplement/laxative No  URINATION: Pain with urination: No Fully empty bladder: No Stream: Strong Urgency: Yes  Frequency: every 1-2 hours; 1-2x/night Leakage: Urge to void, Walking to the bathroom, Coughing, Sneezing, and Laughing Pads: Yes: 2-3 a day  INTERCOURSE:  Ability to have vaginal penetration Yes  Pain with intercourse: none DrynessNo Climax: WNL Marinoff Scale: 0/3   PREGNANCY: Vaginal deliveries 1 Tearing Yes: 1 suture Episiotomy No C-section deliveries 0 Currently pregnant No  PROLAPSE: Pressure and Bulge   OBJECTIVE:  Note: Objective measures were completed at Evaluation unless otherwise noted.  10/17/23:  PATIENT SURVEYS:   PFIQ-7: 48  COGNITION: Overall cognitive status: Within functional limits for tasks assessed     SENSATION: Light touch: Appears intact   FUNCTIONAL TESTS:   Squat: WNL Single leg stance:  Rt: pelvic drop  Lt: pelvic drop Curl-up test: upper abdominal distortion    GAIT: Assistive device utilized: None Comments: WNL  POSTURE: rounded shoulders, forward head, decreased lumbar lordosis, increased thoracic kyphosis, and posterior pelvic tilt, and elevated Rt shoulder/Lt iliac crest   LUMBARAROM/PROM:  A/PROM A/PROM  Eval (% available)  Flexion 50  Extension 25  Right lateral flexion 50  Left lateral flexion 50  Right rotation 50  Left rotation 50   (Blank rows = not tested)  PALPATION:   General: tightness in bil lumbar paraspinals   Pelvic Alignment: WNL  Abdominal: decreased rib cage mobility                External Perineal Exam: dryness                             Internal Pelvic Floor: dryness, significant tension and tenderness reproducing pain/pressure, Lt>Rt  Patient confirms identification and approves PT to assess internal pelvic floor and treatment Yes  PELVIC MMT:   MMT eval  Vaginal 1/5, 2 seconds, 5 repeat contractions  Diastasis Recti 2 finger width separation  (Blank rows = not tested)        TONE: High, Lt>Rt - reproduced sensation of pressure/bulge  PROLAPSE: None detected in supine today - possible difficulty with coordinating due to high tension  TODAY'S TREATMENT:                                                                                                                              DATE:  10/29/23 Neuromuscular re-education: Transversus abdominus training with multimodal cues for improved motor control and breath coordination Bil supine UE ball press with transversus abdominus and pelvic floor muscle contractions and breath coordination 10x Supine hip adduction ball press with transversus abdominus and pelvic floor muscle contractions and breath coordination 10x Resisted hip flexion supine 10x bil Bridge with hip adduction, transversus abdominus, and pelvic floor muscle 2 x 10 Supine  march with transversus abdominus and pelvic floor muscle contraction 2 x 10 Seated hip abduction red band with transversus abdominus and pelvic floor muscle 2 x 10 Seated hip adduction ball press with transversus abdominus and pelvic floor muscle 2 x 10 Seated resisted march red band with transversus abdominus and pelvic floor muscle 2 x 10 Exercises: Seated forward fold 2 x 10 breaths  Seated lateral bend 10 breaths bil Seated piriformis stretch 60 sec bil   10/28/23 Manual: Pt provides verbal consent for internal vaginal/rectal pelvic floor exam. Internal pelvic floor muscle release to bil levator ani, Lt>Rt Trigger Point Dry Needling  Initial Treatment: Pt instructed on Dry Needling rational, procedures, and possible side effects. Pt instructed to expect mild to moderate muscle soreness later in the day and/or into the next day.  Pt instructed in methods to reduce muscle soreness. Pt instructed to continue prescribed HEP. Patient was educated on signs and symptoms of infection and other risk factors and advised to seek medical attention should they occur.  Patient verbalized understanding of these instructions and education.   Patient Verbal Consent Given: Yes Education Handout Provided: Yes Muscles Treated: Lt glutes Electrical Stimulation Performed: No Treatment Response/Outcome: twitch response/release  Soft tissue mobilization to Lt glutes Exercises: Supine pelvic tilts 2 x 10 Single knee to chest 5x bil Lower trunk rotation 2 x 10 Supine march 2 x 10 Therapeutic activities: Pelvic floor muscle wand education Bed mobility with breathing and core activation training   10/17/23  EVAL  Neuromuscular re-education: Pt provides verbal consent for internal vaginal/rectal pelvic floor exam. Internal vaginal pelvic floor muscle contraction training Urge dirll The knack Down training Diaphragmatic breathing  Cat cow Child's pose Happy baby Butterfly Double voiding      PATIENT EDUCATION:  Education details: See above Person educated: Patient Education method: Explanation, Demonstration, Tactile cues, Verbal cues, and Handouts Education comprehension: verbalized understanding  HOME EXERCISE PROGRAM: [id number]  ASSESSMENT:  CLINICAL IMPRESSION: Pt saw some good improvement after yesterday. Instead of continuing manual techniques today, we decided to begin core training and progressions with good pressure management. She had some difficulty with good core proprioception, but was able to make good improvements. HEP updated with bridge and seated core progressions due to being more comfortable. She will continue to benefit from skilled PT intervention in order to decrease pelvic floor muscle tone, perform pelvic floor muscle strengthening, improve bladder control, and begin/progress functional core strengthening program.   OBJECTIVE IMPAIRMENTS: decreased activity tolerance, decreased coordination, decreased endurance, decreased mobility, decreased ROM, decreased strength, increased fascial restrictions, increased muscle spasms, impaired tone, postural dysfunction, and pain.   ACTIVITY LIMITATIONS: lifting, bending, standing, squatting, continence, and locomotion level  PARTICIPATION LIMITATIONS: cleaning, laundry, driving, community activity, and occupation  PERSONAL FACTORS: 1 comorbidity: medical history  are also affecting patient's functional outcome.   REHAB POTENTIAL: Good  CLINICAL DECISION MAKING: Stable/uncomplicated  EVALUATION COMPLEXITY: Low   GOALS: Goals reviewed with patient? Yes  SHORT TERM GOALS: Target date: 11/14/2023    Pt will be independent with HEP.   Baseline: Goal status: INITIAL  2.  Pt will be independent with the knack, urge suppression technique, and double voiding in order to improve bladder habits and decrease urinary incontinence.   Baseline:  Goal status: INITIAL  3.  Pt will be able to correctly  perform diaphragmatic breathing and appropriate pressure management in order to prevent worsening vaginal wall laxity and improve pelvic floor A/ROM.   Baseline:  Goal status: INITIAL  4.  Pt will report 25% improvement in urinary incontinence.  Baseline:  Goal status: INITIAL  5.  Pt will be independent with diaphragmatic breathing and down training activities in order to improve pelvic floor relaxation.  Baseline:  Goal status: INITIAL   LONG TERM GOALS: Target date: 10/01/23  Pt will be independent with advanced HEP.   Baseline:  Goal status: INITIAL  2.  Pt will report 75% improvement in urinary incontinence.  Baseline:  Goal status: INITIAL  3.  Pt will report no leaks with laughing, coughing, sneezing in order to improve comfort with interpersonal relationships and community activities.   Baseline:  Goal status: INITIAL  4.  Pt will be able to go 2-3 hours in between voids without urgency or incontinence in order to improve QOL and perform all functional activities with less difficulty.   Baseline:  Goal status: INITIAL  5.  Pt will present with normalized pelvic floor muscle tone and no tenderness.  Baseline:  Goal status: INITIAL  6.  Pt will demonstrate normal pelvic floor muscle tone and A/ROM, able to achieve 4/5 strength with contractions and 10 sec endurance, in order to provide appropriate lumbopelvic support in functional activities.   Baseline:  Goal status: INITIAL  PLAN:  PT FREQUENCY: 1-2x/week  PT DURATION: 6 months  PLANNED INTERVENTIONS: 97110-Therapeutic exercises, 97530- Therapeutic activity, 97112- Neuromuscular re-education, 97535- Self Care, 06301- Manual therapy, Dry Needling, and Biofeedback  PLAN FOR NEXT SESSION: Progress down training; begin core training.    Julio Alm, PT, DPT04/07/2508:11 AM

## 2023-11-05 ENCOUNTER — Ambulatory Visit

## 2023-11-05 DIAGNOSIS — M5459 Other low back pain: Secondary | ICD-10-CM

## 2023-11-05 DIAGNOSIS — R279 Unspecified lack of coordination: Secondary | ICD-10-CM

## 2023-11-05 DIAGNOSIS — M6281 Muscle weakness (generalized): Secondary | ICD-10-CM

## 2023-11-05 DIAGNOSIS — M62838 Other muscle spasm: Secondary | ICD-10-CM | POA: Diagnosis not present

## 2023-11-05 DIAGNOSIS — R102 Pelvic and perineal pain: Secondary | ICD-10-CM

## 2023-11-05 DIAGNOSIS — R293 Abnormal posture: Secondary | ICD-10-CM

## 2023-11-05 NOTE — Therapy (Signed)
 OUTPATIENT PHYSICAL THERAPY FEMALE PELVIC TREATMENT   Patient Name: Sarah Gonzales MRN: [id number] DOB:[birth year], 33 y.o., female Today's Date: 11/05/2023  END OF SESSION:  PT End of Session - 11/05/23 1019     Visit Number 4    Date for PT Re-Evaluation 04/02/24    Authorization Type [employer]    PT Start Time 1015    PT Stop Time 1055    PT Time Calculation (min) 40 min    Activity Tolerance Patient tolerated treatment well    Behavior During Therapy WFL for tasks assessed/performed             Past Medical History:  Diagnosis Date   Anxiety    Bartholin cyst    Chronic headaches    Since the age of 66, severe in the past.   De Quervain's tenosynovitis, bilateral 11/10/2021   Depression    Hypertension    Lumbar degenerative disc disease 11/10/2021   Migraines    Mild sleep apnea 12/05/2015   AHI of 5.4. Sleep study 2017.    AHI of 6.2 03/2021. HST     Warts    History reviewed. No pertinent surgical history. Patient Active Problem List   Diagnosis Date Noted   Encounter for IUD removal 09/10/2023   Encounter for management of intrauterine contraceptive device (IUD) 09/10/2023   Arm pain, medial, left 09/10/2023   Benign essential hypertension 04/11/2021   Major depression, recurrent (HCC) 04/11/2021   BMI 40.0-44.9, adult (HCC) 12/14/2015   Migraine headache 07/07/2008    PCP: Modesto Charon, NP  REFERRING PROVIDER: Glenetta Borg, CNM   REFERRING DIAG: [phone] (ICD-10-CM) - Pelvic floor dysfunction  THERAPY DIAG:  Other muscle spasm  Muscle weakness (generalized)  Unspecified lack of coordination  Pelvic pain  Other low back pain  Abnormal posture  Rationale for Evaluation and Treatment: Rehabilitation  ONSET DATE: 2 years   SUBJECTIVE:                                                                                                                                                                                           SUBJECTIVE  STATEMENT: Pt states that Friday last week she had spasm in the muscles that we needled at the beginning of the week. She has noticed that her urinary incontinence has been better.   PAIN: 4/8//25 Are you having pain? Yes - she states that she has degenerative disc disease NPRS scale: 7.5-8/10 Pain location:  low back pain  Pain type: tight Pain description: constant   Aggravating factors: standing, bending, lifting Relieving factors: sitting, rest  PRECAUTIONS: None  RED FLAGS: None  WEIGHT BEARING RESTRICTIONS: No  FALLS:  Has patient fallen in last 6 months? No  OCCUPATION: not working  ACTIVITY LEVEL : some exercise; walks 1-2 miles at a time   PLOF: Independent  PATIENT GOALS: improve bladder control; strengthen pelvic floor   PERTINENT HISTORY:  Anxiety, bartholin cyst, chronic headaches, HTN, depression, mild sleep apnea, anal fissure, degenerative disc disease    BOWEL MOVEMENT: Pain with bowel movement: No - history of anal fissure  Type of bowel movement:Frequency 1x/day and Strain none Fully empty rectum: Yes:   Leakage: No Pads: Yes: see below Fiber supplement/laxative No  URINATION: Pain with urination: No Fully empty bladder: No Stream: Strong Urgency: Yes  Frequency: every 1-2 hours; 1-2x/night Leakage: Urge to void, Walking to the bathroom, Coughing, Sneezing, and Laughing Pads: Yes: 2-3 a day  INTERCOURSE:  Ability to have vaginal penetration Yes  Pain with intercourse: none DrynessNo Climax: WNL Marinoff Scale: 0/3   PREGNANCY: Vaginal deliveries 1 Tearing Yes: 1 suture Episiotomy No C-section deliveries 0 Currently pregnant No  PROLAPSE: Pressure and Bulge   OBJECTIVE:  Note: Objective measures were completed at Evaluation unless otherwise noted.  10/17/23:  PATIENT SURVEYS:   PFIQ-7: 48  COGNITION: Overall cognitive status: Within functional limits for tasks assessed     SENSATION: Light touch: Appears  intact   FUNCTIONAL TESTS:  Squat: WNL Single leg stance:  Rt: pelvic drop  Lt: pelvic drop Curl-up test: upper abdominal distortion    GAIT: Assistive device utilized: None Comments: WNL  POSTURE: rounded shoulders, forward head, decreased lumbar lordosis, increased thoracic kyphosis, and posterior pelvic tilt, and elevated Rt shoulder/Lt iliac crest   LUMBARAROM/PROM:  A/PROM A/PROM  Eval (% available)  Flexion 50  Extension 25  Right lateral flexion 50  Left lateral flexion 50  Right rotation 50  Left rotation 50   (Blank rows = not tested)  PALPATION:   General: tightness in bil lumbar paraspinals   Pelvic Alignment: WNL  Abdominal: decreased rib cage mobility                External Perineal Exam: dryness                             Internal Pelvic Floor: dryness, significant tension and tenderness reproducing pain/pressure, Lt>Rt  Patient confirms identification and approves PT to assess internal pelvic floor and treatment Yes  PELVIC MMT:   MMT eval  Vaginal 1/5, 2 seconds, 5 repeat contractions  Diastasis Recti 2 finger width separation  (Blank rows = not tested)        TONE: High, Lt>Rt - reproduced sensation of pressure/bulge  PROLAPSE: None detected in supine today - possible difficulty with coordinating due to high tension  TODAY'S TREATMENT:  DATE:  11/05/23 Manual: Pt provides verbal consent for internal vaginal/rectal pelvic floor exam. Internal pelvic floor muscle release to bil levator ani, Lt>Rt Trigger Point Dry Needling  Initial Treatment: Pt instructed on Dry Needling rational, procedures, and possible side effects. Pt instructed to expect mild to moderate muscle soreness later in the day and/or into the next day.  Pt instructed in methods to reduce muscle soreness. Pt instructed to continue prescribed  HEP. Patient was educated on signs and symptoms of infection and other risk factors and advised to seek medical attention should they occur.  Patient verbalized understanding of these instructions and education.   Patient Verbal Consent Given: Yes Education Handout Provided: Yes Muscles Treated: Lt glutes Electrical Stimulation Performed: No Treatment Response/Outcome: twitch response/release  Soft tissue mobilization to Lt glutes Neuromuscular re-education: Diaphragmatic breathing  Reverse kegel for improved pelvic floor muscle relaxation and proprioception    10/29/23 Neuromuscular re-education: Transversus abdominus training with multimodal cues for improved motor control and breath coordination Bil supine UE ball press with transversus abdominus and pelvic floor muscle contractions and breath coordination 10x Supine hip adduction ball press with transversus abdominus and pelvic floor muscle contractions and breath coordination 10x Resisted hip flexion supine 10x bil Bridge with hip adduction, transversus abdominus, and pelvic floor muscle 2 x 10 Supine march with transversus abdominus and pelvic floor muscle contraction 2 x 10 Seated hip abduction red band with transversus abdominus and pelvic floor muscle 2 x 10 Seated hip adduction ball press with transversus abdominus and pelvic floor muscle 2 x 10 Seated resisted march red band with transversus abdominus and pelvic floor muscle 2 x 10 Exercises: Seated forward fold 2 x 10 breaths  Seated lateral bend 10 breaths bil Seated piriformis stretch 60 sec bil   10/28/23 Manual: Pt provides verbal consent for internal vaginal/rectal pelvic floor exam. Internal pelvic floor muscle release to bil levator ani, Lt>Rt Trigger Point Dry Needling  Initial Treatment: Pt instructed on Dry Needling rational, procedures, and possible side effects. Pt instructed to expect mild to moderate muscle soreness later in the day and/or into the next  day.  Pt instructed in methods to reduce muscle soreness. Pt instructed to continue prescribed HEP. Patient was educated on signs and symptoms of infection and other risk factors and advised to seek medical attention should they occur.  Patient verbalized understanding of these instructions and education.   Patient Verbal Consent Given: Yes Education Handout Provided: Yes Muscles Treated: Lt glutes Electrical Stimulation Performed: No Treatment Response/Outcome: twitch response/release  Soft tissue mobilization to Lt glutes Exercises: Supine pelvic tilts 2 x 10 Single knee to chest 5x bil Lower trunk rotation 2 x 10 Supine march 2 x 10 Therapeutic activities: Pelvic floor muscle wand education Bed mobility with breathing and core activation training    PATIENT EDUCATION:  Education details: See above Person educated: Patient Education method: Programmer, multimedia, Demonstration, Tactile cues, Verbal cues, and Handouts Education comprehension: verbalized understanding  HOME EXERCISE PROGRAM: [id number]  ASSESSMENT:  CLINICAL IMPRESSION: Pt has seen some progress with urinary control, likely due to increased relaxation of pelvic floor muscles and more available strength. She has seen some improvement in pain, but she did have increased spasm in Lt hip the other day. We discussed how this can happen as we are in the process of improving muscle spasm. Due to this, we performed dry needling again today with good tolerance and large twitch response. She had more notable tension in pelvic floor today with pain. She  was encouraged to go ahead and get pelvic floor muscle wand and start using; we discussed how to begin this process and she was encouraged to bring in next session so we can work through any issues. She will continue to benefit from skilled PT intervention in order to decrease pelvic floor muscle tone, perform pelvic floor muscle strengthening, improve bladder control, and begin/progress  functional core strengthening program.   OBJECTIVE IMPAIRMENTS: decreased activity tolerance, decreased coordination, decreased endurance, decreased mobility, decreased ROM, decreased strength, increased fascial restrictions, increased muscle spasms, impaired tone, postural dysfunction, and pain.   ACTIVITY LIMITATIONS: lifting, bending, standing, squatting, continence, and locomotion level  PARTICIPATION LIMITATIONS: cleaning, laundry, driving, community activity, and occupation  PERSONAL FACTORS: 1 comorbidity: medical history  are also affecting patient's functional outcome.   REHAB POTENTIAL: Good  CLINICAL DECISION MAKING: Stable/uncomplicated  EVALUATION COMPLEXITY: Low   GOALS: Goals reviewed with patient? Yes  SHORT TERM GOALS: Target date: 11/14/2023    Pt will be independent with HEP.   Baseline: Goal status: INITIAL  2.  Pt will be independent with the knack, urge suppression technique, and double voiding in order to improve bladder habits and decrease urinary incontinence.   Baseline:  Goal status: INITIAL  3.  Pt will be able to correctly perform diaphragmatic breathing and appropriate pressure management in order to prevent worsening vaginal wall laxity and improve pelvic floor A/ROM.   Baseline:  Goal status: INITIAL  4.  Pt will report 25% improvement in urinary incontinence.  Baseline:  Goal status: INITIAL  5.  Pt will be independent with diaphragmatic breathing and down training activities in order to improve pelvic floor relaxation.  Baseline:  Goal status: INITIAL   LONG TERM GOALS: Target date: 10/01/23  Pt will be independent with advanced HEP.   Baseline:  Goal status: INITIAL  2.  Pt will report 75% improvement in urinary incontinence.  Baseline:  Goal status: INITIAL  3.  Pt will report no leaks with laughing, coughing, sneezing in order to improve comfort with interpersonal relationships and community activities.   Baseline:  Goal  status: INITIAL  4.  Pt will be able to go 2-3 hours in between voids without urgency or incontinence in order to improve QOL and perform all functional activities with less difficulty.   Baseline:  Goal status: INITIAL  5.  Pt will present with normalized pelvic floor muscle tone and no tenderness.  Baseline:  Goal status: INITIAL  6.  Pt will demonstrate normal pelvic floor muscle tone and A/ROM, able to achieve 4/5 strength with contractions and 10 sec endurance, in order to provide appropriate lumbopelvic support in functional activities.   Baseline:  Goal status: INITIAL  PLAN:  PT FREQUENCY: 1-2x/week  PT DURATION: 6 months  PLANNED INTERVENTIONS: 97110-Therapeutic exercises, 97530- Therapeutic activity, 97112- Neuromuscular re-education, 97535- Self Care, 16109- Manual therapy, Dry Needling, and Biofeedback  PLAN FOR NEXT SESSION: Progress down training; progress stretches.    Julio Alm, PT, DPT04/02/2509:19 AM

## 2023-11-12 ENCOUNTER — Ambulatory Visit: Payer: Self-pay

## 2023-11-12 DIAGNOSIS — M62838 Other muscle spasm: Secondary | ICD-10-CM

## 2023-11-12 DIAGNOSIS — M5459 Other low back pain: Secondary | ICD-10-CM

## 2023-11-12 DIAGNOSIS — M6281 Muscle weakness (generalized): Secondary | ICD-10-CM

## 2023-11-12 DIAGNOSIS — R102 Pelvic and perineal pain: Secondary | ICD-10-CM

## 2023-11-12 DIAGNOSIS — R279 Unspecified lack of coordination: Secondary | ICD-10-CM

## 2023-11-12 DIAGNOSIS — R293 Abnormal posture: Secondary | ICD-10-CM

## 2023-11-12 NOTE — Therapy (Signed)
 OUTPATIENT PHYSICAL THERAPY FEMALE PELVIC TREATMENT   Patient Name: Sarah Gonzales MRN: [id number] DOB:10/12/1990, 33 y.o., female Today's Date: 11/12/2023  END OF SESSION:  PT End of Session - 11/12/23 0932     Visit Number 5    Date for PT Re-Evaluation 04/02/24    Authorization Type [employer]    PT Start Time 0931    PT Stop Time 1011    PT Time Calculation (min) 40 min    Activity Tolerance Patient tolerated treatment well    Behavior During Therapy [facility] for tasks assessed/performed             Past Medical History:  Diagnosis Date   Anxiety    Bartholin cyst    Chronic headaches    Since the age of 49, severe in the past.   De Quervain's tenosynovitis, bilateral 11/10/2021   Depression    Hypertension    Lumbar degenerative disc disease 11/10/2021   Migraines    Mild sleep apnea 12/05/2015   AHI of 5.4. Sleep study 2017.    AHI of 6.2 03/2021. HST     Warts    History reviewed. No pertinent surgical history. Patient Active Problem List   Diagnosis Date Noted   Encounter for IUD removal 09/10/2023   Encounter for management of intrauterine contraceptive device (IUD) 09/10/2023   Arm pain, medial, left 09/10/2023   Benign essential hypertension 04/11/2021   Major depression, recurrent (HCC) 04/11/2021   BMI 40.0-44.9, adult (HCC) 12/14/2015   Migraine headache 07/07/2008    PCP: Jolanda Nation, NP  REFERRING PROVIDER: Phylliss Brenner, CNM   REFERRING DIAG: [phone] (ICD-10-CM) - Pelvic floor dysfunction  THERAPY DIAG:  Other muscle spasm  Muscle weakness (generalized)  Unspecified lack of coordination  Pelvic pain  Other low back pain  Abnormal posture  Rationale for Evaluation and Treatment: Rehabilitation  ONSET DATE: 2 years   SUBJECTIVE:                                                                                                                                                                                           SUBJECTIVE  STATEMENT: Pt states that she has used pelvic floor muscle wand several times. She still feels a little tentative and can feel the pressure in her low back, but she can feel it release a little bit. She does feel like the pain is not as intense as it was and she is not constantly thinking about her pain.   PAIN: 11/12/23 Are you having pain? Yes - she states that she has degenerative disc disease NPRS scale: 7-8/10, movement will still get up to  9/10 Pain location:  low back pain  Pain type: tight Pain description: constant   Aggravating factors: standing, bending, lifting Relieving factors: sitting, rest  PRECAUTIONS: None  RED FLAGS: None   WEIGHT BEARING RESTRICTIONS: No  FALLS:  Has patient fallen in last 6 months? No  OCCUPATION: not working  ACTIVITY LEVEL : some exercise; walks 1-2 miles at a time   PLOF: Independent  PATIENT GOALS: improve bladder control; strengthen pelvic floor   PERTINENT HISTORY:  Anxiety, bartholin cyst, chronic headaches, HTN, depression, mild sleep apnea, anal fissure, degenerative disc disease    BOWEL MOVEMENT: Pain with bowel movement: No - history of anal fissure  Type of bowel movement:Frequency 1x/day and Strain none Fully empty rectum: Yes:   Leakage: No Pads: Yes: see below Fiber supplement/laxative No  URINATION: Pain with urination: No Fully empty bladder: No Stream: Strong Urgency: Yes  Frequency: every 1-2 hours; 1-2x/night Leakage: Urge to void, Walking to the bathroom, Coughing, Sneezing, and Laughing Pads: Yes: 2-3 a day  INTERCOURSE:  Ability to have vaginal penetration Yes  Pain with intercourse: none DrynessNo Climax: WNL Marinoff Scale: 0/3   PREGNANCY: Vaginal deliveries 1 Tearing Yes: 1 suture Episiotomy No C-section deliveries 0 Currently pregnant No  PROLAPSE: Pressure and Bulge   OBJECTIVE:  Note: Objective measures were completed at Evaluation unless otherwise noted.  10/17/23:  PATIENT  SURVEYS:   PFIQ-7: 48  COGNITION: Overall cognitive status: Within functional limits for tasks assessed     SENSATION: Light touch: Appears intact   FUNCTIONAL TESTS:  Squat: WNL Single leg stance:  Rt: pelvic drop  Lt: pelvic drop Curl-up test: upper abdominal distortion    GAIT: Assistive device utilized: None Comments: WNL  POSTURE: rounded shoulders, forward head, decreased lumbar lordosis, increased thoracic kyphosis, and posterior pelvic tilt, and elevated Rt shoulder/Lt iliac crest   LUMBARAROM/PROM:  A/PROM A/PROM  Eval (% available)  Flexion 50  Extension 25  Right lateral flexion 50  Left lateral flexion 50  Right rotation 50  Left rotation 50   (Blank rows = not tested)  PALPATION:   General: tightness in bil lumbar paraspinals   Pelvic Alignment: WNL  Abdominal: decreased rib cage mobility                External Perineal Exam: dryness                             Internal Pelvic Floor: dryness, significant tension and tenderness reproducing pain/pressure, Lt>Rt  Patient confirms identification and approves PT to assess internal pelvic floor and treatment Yes  PELVIC MMT:   MMT eval  Vaginal 1/5, 2 seconds, 5 repeat contractions  Diastasis Recti 2 finger width separation  (Blank rows = not tested)        TONE: High, Lt>Rt - reproduced sensation of pressure/bulge  PROLAPSE: None detected in supine today - possible difficulty with coordinating due to high tension  TODAY'S TREATMENT:  DATE:  11/12/23 Neuromuscular re-education: Bridge with hip adduction, transversus abdominus, and pelvic floor muscle 2 x 10 Bridge with hip abduction using red band, transversus abdominus, and pelvic floor muscle 2 x 10 Exercises: Side lying clam shells 2 x 10 bil Wide leg lower trunk rotation 2 x 10 Open books 10x bil Therapeutic  activities: Review techniques for pelvic floor muscle wand use Sit<>stand 2 x 10   11/05/23 Manual: Pt provides verbal consent for internal vaginal/rectal pelvic floor exam. Internal pelvic floor muscle release to bil levator ani, Lt>Rt Trigger Point Dry Needling  Initial Treatment: Pt instructed on Dry Needling rational, procedures, and possible side effects. Pt instructed to expect mild to moderate muscle soreness later in the day and/or into the next day.  Pt instructed in methods to reduce muscle soreness. Pt instructed to continue prescribed HEP. Patient was educated on signs and symptoms of infection and other risk factors and advised to seek medical attention should they occur.  Patient verbalized understanding of these instructions and education.   Patient Verbal Consent Given: Yes Education Handout Provided: Yes Muscles Treated: Lt glutes Electrical Stimulation Performed: No Treatment Response/Outcome: twitch response/release  Soft tissue mobilization to Lt glutes Neuromuscular re-education: Diaphragmatic breathing  Reverse kegel for improved pelvic floor muscle relaxation and proprioception    10/29/23 Neuromuscular re-education: Transversus abdominus training with multimodal cues for improved motor control and breath coordination Bil supine UE ball press with transversus abdominus and pelvic floor muscle contractions and breath coordination 10x Supine hip adduction ball press with transversus abdominus and pelvic floor muscle contractions and breath coordination 10x Resisted hip flexion supine 10x bil Bridge with hip adduction, transversus abdominus, and pelvic floor muscle 2 x 10 Supine march with transversus abdominus and pelvic floor muscle contraction 2 x 10 Seated hip abduction red band with transversus abdominus and pelvic floor muscle 2 x 10 Seated hip adduction ball press with transversus abdominus and pelvic floor muscle 2 x 10 Seated resisted march red band  with transversus abdominus and pelvic floor muscle 2 x 10 Exercises: Seated forward fold 2 x 10 breaths  Seated lateral bend 10 breaths bil Seated piriformis stretch 60 sec bil    PATIENT EDUCATION:  Education details: See above Person educated: Patient Education method: Programmer, multimedia, Demonstration, Actor cues, Verbal cues, and Handouts Education comprehension: verbalized understanding  HOME EXERCISE PROGRAM: [id number]  ASSESSMENT:  CLINICAL IMPRESSION: Pt is seeing some progress in low back pain intensity and finds herself not thinking about the pain as constantly. She is using wand on regular basis now to help relieve pelvic floor muscle tension. Due to overall limit improvement in pain with focus on manual techniques and her own ability to perform this release at home, we focused more on core and hip strengthening to help improve pelvic stability today. Believe this will help her see faster progress. She had a lot of difficulty with motor control of squat, but made some good improvements with verbal cues. HEP updated.  She will continue to benefit from skilled PT intervention in order to decrease pelvic floor muscle tone, perform pelvic floor muscle strengthening, improve bladder control, and begin/progress functional core strengthening program.   OBJECTIVE IMPAIRMENTS: decreased activity tolerance, decreased coordination, decreased endurance, decreased mobility, decreased ROM, decreased strength, increased fascial restrictions, increased muscle spasms, impaired tone, postural dysfunction, and pain.   ACTIVITY LIMITATIONS: lifting, bending, standing, squatting, continence, and locomotion level  PARTICIPATION LIMITATIONS: cleaning, laundry, driving, community activity, and occupation  PERSONAL FACTORS: 1 comorbidity: medical history  are also affecting patient's functional outcome.   REHAB POTENTIAL: Good  CLINICAL DECISION MAKING: Stable/uncomplicated  EVALUATION COMPLEXITY:  Low   GOALS: Goals reviewed with patient? Yes  SHORT TERM GOALS: Target date: 11/14/2023 - updated 11/12/23    Pt will be independent with HEP.   Baseline: Goal status: MET 11/12/23  2.  Pt will be independent with the knack, urge suppression technique, and double voiding in order to improve bladder habits and decrease urinary incontinence.   Baseline:  Goal status: MET 11/12/23  3.  Pt will be able to correctly perform diaphragmatic breathing and appropriate pressure management in order to prevent worsening vaginal wall laxity and improve pelvic floor A/ROM.   Baseline:  Goal status: MET 11/12/23  4.  Pt will report 25% improvement in urinary incontinence.  Baseline:  Goal status: MET 11/12/23  5.  Pt will be independent with diaphragmatic breathing and down training activities in order to improve pelvic floor relaxation.  Baseline:  Goal status: 11/12/23   LONG TERM GOALS: Target date: 10/01/23 - updated 11/12/23  Pt will be independent with advanced HEP.   Baseline:  Goal status: INITIAL  2.  Pt will report 75% improvement in urinary incontinence.  Baseline:  Goal status: INITIAL  3.  Pt will report no leaks with laughing, coughing, sneezing in order to improve comfort with interpersonal relationships and community activities.   Baseline:  Goal status: INITIAL  4.  Pt will be able to go 2-3 hours in between voids without urgency or incontinence in order to improve QOL and perform all functional activities with less difficulty.   Baseline:  Goal status: INITIAL  5.  Pt will present with normalized pelvic floor muscle tone and no tenderness.  Baseline:  Goal status: INITIAL  6.  Pt will demonstrate normal pelvic floor muscle tone and A/ROM, able to achieve 4/5 strength with contractions and 10 sec endurance, in order to provide appropriate lumbopelvic support in functional activities.   Baseline:  Goal status: INITIAL  PLAN:  PT FREQUENCY: 1-2x/week  PT  DURATION: 6 months  PLANNED INTERVENTIONS: 97110-Therapeutic exercises, 97530- Therapeutic activity, 97112- Neuromuscular re-education, 97535- Self Care, 96789- Manual therapy, Dry Needling, and Biofeedback  PLAN FOR NEXT SESSION: Progress down training; progress stretches.    Verlena Glenn, PT, DPT04/15/2510:28 AM

## 2023-11-25 ENCOUNTER — Ambulatory Visit: Payer: Self-pay

## 2023-11-25 DIAGNOSIS — M62838 Other muscle spasm: Secondary | ICD-10-CM | POA: Diagnosis not present

## 2023-11-25 DIAGNOSIS — R102 Pelvic and perineal pain: Secondary | ICD-10-CM

## 2023-11-25 DIAGNOSIS — R293 Abnormal posture: Secondary | ICD-10-CM

## 2023-11-25 DIAGNOSIS — M6281 Muscle weakness (generalized): Secondary | ICD-10-CM

## 2023-11-25 DIAGNOSIS — M5459 Other low back pain: Secondary | ICD-10-CM

## 2023-11-25 DIAGNOSIS — R279 Unspecified lack of coordination: Secondary | ICD-10-CM

## 2023-11-25 NOTE — Therapy (Signed)
 OUTPATIENT PHYSICAL THERAPY FEMALE PELVIC TREATMENT   Patient Name: Sarah Gonzales MRN: [id number] DOB:1991-06-02, 33 y.o., female Today's Date: 11/25/2023  END OF SESSION:  PT End of Session - 11/25/23 1150     Visit Number 6    Date for PT Re-Evaluation 04/02/24    Authorization Type [employer]    PT Start Time 1145    PT Stop Time 1225    PT Time Calculation (min) 40 min    Activity Tolerance Patient tolerated treatment well    Behavior During Therapy WFL for tasks assessed/performed             Past Medical History:  Diagnosis Date   Anxiety    Bartholin cyst    Chronic headaches    Since the age of 5, severe in the past.   De Quervain's tenosynovitis, bilateral 11/10/2021   Depression    Hypertension    Lumbar degenerative disc disease 11/10/2021   Migraines    Mild sleep apnea 12/05/2015   AHI of 5.4. Sleep study 2017.    AHI of 6.2 03/2021. HST     Warts    History reviewed. No pertinent surgical history. Patient Active Problem List   Diagnosis Date Noted   Encounter for IUD removal 09/10/2023   Encounter for management of intrauterine contraceptive device (IUD) 09/10/2023   Arm pain, medial, left 09/10/2023   Benign essential hypertension 04/11/2021   Major depression, recurrent (HCC) 04/11/2021   BMI 40.0-44.9, adult (HCC) 12/14/2015   Migraine headache 07/07/2008    PCP: Jolanda Nation, NP  REFERRING PROVIDER: Phylliss Brenner, CNM   REFERRING DIAG: [phone] (ICD-10-CM) - Pelvic floor dysfunction  THERAPY DIAG:  Other muscle spasm  Muscle weakness (generalized)  Unspecified lack of coordination  Pelvic pain  Other low back pain  Abnormal posture  Rationale for Evaluation and Treatment: Rehabilitation  ONSET DATE: 2 years   SUBJECTIVE:                                                                                                                                                                                           SUBJECTIVE  STATEMENT:   PAIN: 11/25/23 Are you having pain? Yes - she states that she has degenerative disc disease NPRS scale:  Pain location:  low back pain  Pain type: tight Pain description: constant   Aggravating factors: standing, bending, lifting Relieving factors: sitting, rest  PRECAUTIONS: None  RED FLAGS: None   WEIGHT BEARING RESTRICTIONS: No  FALLS:  Has patient fallen in last 6 months? No  OCCUPATION: not working  ACTIVITY LEVEL : some exercise; walks 1-2 miles at a time  PLOF: Independent  PATIENT GOALS: improve bladder control; strengthen pelvic floor   PERTINENT HISTORY:  Anxiety, bartholin cyst, chronic headaches, HTN, depression, mild sleep apnea, anal fissure, degenerative disc disease    BOWEL MOVEMENT: Pain with bowel movement: No - history of anal fissure  Type of bowel movement:Frequency 1x/day and Strain none Fully empty rectum: Yes:   Leakage: No Pads: Yes: see below Fiber supplement/laxative No  URINATION: Pain with urination: No Fully empty bladder: No Stream: Strong Urgency: Yes  Frequency: every 1-2 hours; 1-2x/night Leakage: Urge to void, Walking to the bathroom, Coughing, Sneezing, and Laughing Pads: Yes: 2-3 a day  INTERCOURSE:  Ability to have vaginal penetration Yes  Pain with intercourse: none DrynessNo Climax: WNL Marinoff Scale: 0/3   PREGNANCY: Vaginal deliveries 1 Tearing Yes: 1 suture Episiotomy No C-section deliveries 0 Currently pregnant No  PROLAPSE: Pressure and Bulge   OBJECTIVE:  Note: Objective measures were completed at Evaluation unless otherwise noted.  10/17/23:  PATIENT SURVEYS:   PFIQ-7: 48  COGNITION: Overall cognitive status: Within functional limits for tasks assessed     SENSATION: Light touch: Appears intact   FUNCTIONAL TESTS:  Squat: WNL Single leg stance:  Rt: pelvic drop  Lt: pelvic drop Curl-up test: upper abdominal distortion    GAIT: Assistive device utilized:  None Comments: WNL  POSTURE: rounded shoulders, forward head, decreased lumbar lordosis, increased thoracic kyphosis, and posterior pelvic tilt, and elevated Rt shoulder/Lt iliac crest   LUMBARAROM/PROM:  A/PROM A/PROM  Eval (% available)  Flexion 50  Extension 25  Right lateral flexion 50  Left lateral flexion 50  Right rotation 50  Left rotation 50   (Blank rows = not tested)  PALPATION:   General: tightness in bil lumbar paraspinals   Pelvic Alignment: WNL  Abdominal: decreased rib cage mobility                External Perineal Exam: dryness                             Internal Pelvic Floor: dryness, significant tension and tenderness reproducing pain/pressure, Lt>Rt  Patient confirms identification and approves PT to assess internal pelvic floor and treatment Yes  PELVIC MMT:   MMT eval  Vaginal 1/5, 2 seconds, 5 repeat contractions  Diastasis Recti 2 finger width separation  (Blank rows = not tested)        TONE: High, Lt>Rt - reproduced sensation of pressure/bulge  PROLAPSE: None detected in supine today - possible difficulty with coordinating due to high tension  TODAY'S TREATMENT:                                                                                                                              DATE:  11/25/23 Neuromuscular re-education: Diaphragmatic breathing Supine with 10lb wt: 3 seconds x 4, 4 seconds x 4, 5 seconds x 4 Supine green band around lower rib  cage: 3 seconds x 4, 4 seconds x 4, 5 seconds x 4 Therapeutic activities: Squats to table with red band around thighs 2 x 10 to table 3 way kick red band around thighs 10x each direction, bil Side stepping 4 laps with red band at thighs  Pt education provided on using pelvic support belt when she does activities that flare up low back pain - may help her be more comfortable and also allow us  to determine if instability continues to be part of her pain   11/12/23 Neuromuscular  re-education: Bridge with hip adduction, transversus abdominus, and pelvic floor muscle 2 x 10 Bridge with hip abduction using red band, transversus abdominus, and pelvic floor muscle 2 x 10 Exercises: Side lying clam shells 2 x 10 bil Wide leg lower trunk rotation 2 x 10 Open books 10x bil Therapeutic activities: Review techniques for pelvic floor muscle wand use Sit<>stand 2 x 10   11/05/23 Manual: Pt provides verbal consent for internal vaginal/rectal pelvic floor exam. Internal pelvic floor muscle release to bil levator ani, Lt>Rt Trigger Point Dry Needling  Initial Treatment: Pt instructed on Dry Needling rational, procedures, and possible side effects. Pt instructed to expect mild to moderate muscle soreness later in the day and/or into the next day.  Pt instructed in methods to reduce muscle soreness. Pt instructed to continue prescribed HEP. Patient was educated on signs and symptoms of infection and other risk factors and advised to seek medical attention should they occur.  Patient verbalized understanding of these instructions and education.   Patient Verbal Consent Given: Yes Education Handout Provided: Yes Muscles Treated: Lt glutes Electrical Stimulation Performed: No Treatment Response/Outcome: twitch response/release  Soft tissue mobilization to Lt glutes Neuromuscular re-education: Diaphragmatic breathing  Reverse kegel for improved pelvic floor muscle relaxation and proprioception    PATIENT EDUCATION:  Education details: See above Person educated: Patient Education method: Explanation, Demonstration, Tactile cues, Verbal cues, and Handouts Education comprehension: verbalized understanding  HOME EXERCISE PROGRAM: [id number]  ASSESSMENT:  CLINICAL IMPRESSION: Pt doing well with some improvement in low back pain. We spent time working on improving diaphragmatic breathing in order to make sure she is getting good abdominal mobility and pelvic floor muscle  relaxation. She did very well with these techniques, but did initially have some trouble with pacing her breathing and not pushing her stomach out with inhales. Good improvements in overall relaxation after working on this. She did well with functional strength training with some soreness in low back, but no pain. We discussed propping foot for lumbar support during bent forward functional activities and using pelvic support band for improved stability. She will continue to benefit from skilled PT intervention in order to decrease pelvic floor muscle tone, perform pelvic floor muscle strengthening, improve bladder control, and begin/progress functional core strengthening program.   OBJECTIVE IMPAIRMENTS: decreased activity tolerance, decreased coordination, decreased endurance, decreased mobility, decreased ROM, decreased strength, increased fascial restrictions, increased muscle spasms, impaired tone, postural dysfunction, and pain.   ACTIVITY LIMITATIONS: lifting, bending, standing, squatting, continence, and locomotion level  PARTICIPATION LIMITATIONS: cleaning, laundry, driving, community activity, and occupation  PERSONAL FACTORS: 1 comorbidity: medical history  are also affecting patient's functional outcome.   REHAB POTENTIAL: Good  CLINICAL DECISION MAKING: Stable/uncomplicated  EVALUATION COMPLEXITY: Low   GOALS: Goals reviewed with patient? Yes  SHORT TERM GOALS: Target date: 11/14/2023 - updated 11/12/23    Pt will be independent with HEP.   Baseline: Goal status: MET 11/12/23  2.  Pt  will be independent with the knack, urge suppression technique, and double voiding in order to improve bladder habits and decrease urinary incontinence.   Baseline:  Goal status: MET 11/12/23  3.  Pt will be able to correctly perform diaphragmatic breathing and appropriate pressure management in order to prevent worsening vaginal wall laxity and improve pelvic floor A/ROM.   Baseline:  Goal  status: MET 11/12/23  4.  Pt will report 25% improvement in urinary incontinence.  Baseline:  Goal status: MET 11/12/23  5.  Pt will be independent with diaphragmatic breathing and down training activities in order to improve pelvic floor relaxation.  Baseline:  Goal status: 11/12/23   LONG TERM GOALS: Target date: 10/01/23 - updated 11/12/23  Pt will be independent with advanced HEP.   Baseline:  Goal status: INITIAL  2.  Pt will report 75% improvement in urinary incontinence.  Baseline:  Goal status: INITIAL  3.  Pt will report no leaks with laughing, coughing, sneezing in order to improve comfort with interpersonal relationships and community activities.   Baseline:  Goal status: INITIAL  4.  Pt will be able to go 2-3 hours in between voids without urgency or incontinence in order to improve QOL and perform all functional activities with less difficulty.   Baseline:  Goal status: INITIAL  5.  Pt will present with normalized pelvic floor muscle tone and no tenderness.  Baseline:  Goal status: INITIAL  6.  Pt will demonstrate normal pelvic floor muscle tone and A/ROM, able to achieve 4/5 strength with contractions and 10 sec endurance, in order to provide appropriate lumbopelvic support in functional activities.   Baseline:  Goal status: INITIAL  PLAN:  PT FREQUENCY: 1-2x/week  PT DURATION: 6 months  PLANNED INTERVENTIONS: 97110-Therapeutic exercises, 97530- Therapeutic activity, 97112- Neuromuscular re-education, 97535- Self Care, 16109- Manual therapy, Dry Needling, and Biofeedback  PLAN FOR NEXT SESSION: Progress down training; progress stretches.    Verlena Glenn, PT, DPT04/28/2512:19 PM

## 2023-12-04 ENCOUNTER — Encounter

## 2023-12-06 ENCOUNTER — Ambulatory Visit
Admission: EM | Admit: 2023-12-06 | Discharge: 2023-12-06 | Disposition: A | Discharge status: Home / Self Care | Attending: Emergency Medicine | Admitting: Emergency Medicine

## 2023-12-06 DIAGNOSIS — J209 Acute bronchitis, unspecified: Secondary | ICD-10-CM | POA: Diagnosis not present

## 2023-12-06 MED ORDER — PREDNISONE 10 MG (21) PO TBPK: ORAL_TABLET | Freq: Every day | ORAL | 0 refills | Status: DC

## 2023-12-06 MED ORDER — ALBUTEROL SULFATE HFA 108 (90 BASE) MCG/ACT IN AERS: 2.0000 | INHALATION_SPRAY | Freq: Four times a day (QID) | RESPIRATORY_TRACT | 0 refills | Status: AC | PRN

## 2023-12-06 NOTE — Discharge Instructions (Signed)
 Your symptoms today are most likely being caused by a virus and should steadily improve in time it can take up to 7 to 10 days before you truly start to see a turnaround however things will get better  Begin prednisone  every morning with food as directed to open and relax the airway, should settle shortness of breath and wheezing, will also help with soreness to the body, stop use of ibuprofen  during treatment  You may take 2 puffs of albuterol inhaler every 6 hours as needed for wheezing or shortness of breath to make it easier for you to breathe    You can take Tylenol  as needed for fever reduction and pain relief.   For cough: honey 1/2 to 1 teaspoon (you can dilute the honey in water or another fluid).  You can also use guaifenesin and dextromethorphan for cough. You can use a humidifier for chest congestion and cough.  If you don't have a humidifier, you can sit in the bathroom with the hot shower running.      For sore throat: try warm salt water gargles, cepacol lozenges, throat spray, warm tea or water with lemon/honey, popsicles or ice, or OTC cold relief medicine for throat discomfort.   For congestion: take a daily anti-histamine like Zyrtec, Claritin, and a oral decongestant, such as pseudoephedrine.  You can also use Flonase  1-2 sprays in each nostril daily.   It is important to stay hydrated: drink plenty of fluids (water, gatorade/powerade/pedialyte, juices, or teas) to keep your throat moisturized and help further relieve irritation/discomfort.

## 2023-12-06 NOTE — ED Triage Notes (Signed)
 Patient presents to UC for cough, wheezing, chills, chest pain when coughing, and night sweats x 4 days. Took a dose of robitussin, ibuprofen  for pain. Concerned with bronchitis. States her son was sick.   Negative covid test yesterday.

## 2023-12-06 NOTE — ED Provider Notes (Signed)
 Sarah Gonzales    CSN: [id number] Arrival date & time: 12/06/23  0818      History   Chief Complaint Chief Complaint  Patient presents with   Cough    HPI Sarah Gonzales is a 33 y.o. female.   Patient presents for evaluation of chills, diaphoresis overnight, productive cough, postnasal drip, shortness of breath with exertion and with coughing, intermittent wheezing and centralized chest pain exacerbated by coughing present for 4 days.  Known sick contact in household.  Home COVID testing negative.  Has attempted use of ibuprofen  and Robitussin.  Tolerating food and liquids but appetite is decreased.  Denies fever.  Past respiratory history, non-smoker.   Past Medical History:  Diagnosis Date   Anxiety    Bartholin cyst    Chronic headaches    Since the age of 20, severe in the past.   De Quervain's tenosynovitis, bilateral 11/10/2021   Depression    Hypertension    Lumbar degenerative disc disease 11/10/2021   Migraines    Mild sleep apnea 12/05/2015   AHI of 5.4. Sleep study 2017.    AHI of 6.2 03/2021. HST     Warts     Patient Active Problem List   Diagnosis Date Noted   Encounter for IUD removal 09/10/2023   Encounter for management of intrauterine contraceptive device (IUD) 09/10/2023   Arm pain, medial, left 09/10/2023   Benign essential hypertension 04/11/2021   Major depression, recurrent (HCC) 04/11/2021   BMI 40.0-44.9, adult (HCC) 12/14/2015   Migraine headache 07/07/2008    History reviewed. No pertinent surgical history.  OB History     Gravida  1   Para  1   Term  1   Preterm  0   AB  0   Living  1      SAB  0   IAB  0   Ectopic  0   Multiple  0   Live Births  1            Home Medications    Prior to Admission medications   Medication Sig Start Date End Date Taking? Authorizing Provider  albuterol (VENTOLIN HFA) 108 (90 Base) MCG/ACT inhaler Inhale 2 puffs into the lungs every 6 (six) hours as needed for  wheezing or shortness of breath. 12/06/23  Yes Hollyanne Schloesser R, NP  predniSONE  (STERAPRED UNI-PAK 21 TAB) 10 MG (21) TBPK tablet Take by mouth daily. Take 6 tabs by mouth daily  for 1 days, then 5 tabs for 1 days, then 4 tabs for 1 days, then 3 tabs for 1 days, 2 tabs for 1 days, then 1 tab by mouth daily for 1 days 12/06/23  Yes Sabine Tenenbaum R, NP  cyclobenzaprine  (FLEXERIL ) 5 MG tablet Take 1 tablet (5 mg total) by mouth 3 (three) times daily as needed. 09/10/23   Jolanda Nation, NP  eletriptan  (RELPAX ) 40 MG tablet Take 1 tablet (40 mg total) by mouth as needed for migraine or headache. May repeat in 2 hours if headache persists or recurs. Patient not taking: Reported on 09/10/2023 12/07/22   Cydney Draft, MD  Erenumab -aooe (AIMOVIG ) 70 MG/ML SOAJ Inject 70 mg into the skin every 30 (thirty) days. 08/22/23   Cydney Draft, MD  FLUoxetine  (PROZAC ) 20 MG capsule Take 2 capsules by mouth daily. 09/27/23   Cydney Draft, MD  propranolol  ER (INDERAL  LA) 80 MG 24 hr capsule Take 1 capsule (80 mg total) by mouth daily. NEEDS APPOINTMENT  FOR FURTHER REFILLS. 10/16/23   Cherre Cornish, NP  rizatriptan  (MAXALT -MLT) 10 MG disintegrating tablet Take 1 tablet (10 mg total) by mouth as needed for migraine. May repeat in 2 hours if needed 07/18/23   Cherre Cornish, NP  Segesterone-Ethinyl Estradiol  (ANNOVERA ) 0.15-0.013 MG/24HR RING Place 1 Ring vaginally continuous. Place ring and leave in for 3 weeks,  then remove for one week. Can be used continuously without removal if desired. 10/16/23   Slaughterbeck, Sherline Distel, CNM    Family History Family History  Problem Relation Age of Onset   Miscarriages / Stillbirths Mother    Hypertension Mother    Hypertension Father    Sleep apnea Father    Bipolar disorder Maternal Aunt    Hypertension Maternal Grandmother    Hypertension Maternal Grandfather    Lung cancer Maternal Grandfather    Arthritis Paternal Grandmother    Hypertension Paternal  Grandmother    Heart disease Paternal Grandfather    Arthritis Paternal Grandfather    Hypertension Paternal Grandfather    Asthma Other        aunt    Social History Social History   Tobacco Use   Smoking status: Never   Smokeless tobacco: Never  Vaping Use   Vaping status: Never Used  Substance Use Topics   Alcohol use: Yes    Alcohol/week: 2.0 standard drinks of alcohol    Types: 2 Standard drinks or equivalent per week   Drug use: No     Allergies   Patient has no known allergies.   Review of Systems Review of Systems   Physical Exam Triage Vital Signs ED Triage Vitals [12/06/23 0931]  Encounter Vitals Group     BP 136/84     Systolic BP Percentile      Diastolic BP Percentile      Pulse Rate 80     Resp 16     Temp 98.1 F (36.7 C)     Temp Source Temporal     SpO2 95 %     Weight      Height      Head Circumference      Peak Flow      Pain Score 0     Pain Loc      Pain Education      Exclude from Growth Chart    No data found.  Updated Vital Signs BP 136/84 (BP Location: Left Arm)   Pulse 80   Temp 98.1 F (36.7 C) (Temporal)   Resp 16   SpO2 95%   Visual Acuity Right Eye Distance:   Left Eye Distance:   Bilateral Distance:    Right Eye Near:   Left Eye Near:    Bilateral Near:     Physical Exam Constitutional:      Appearance: Normal appearance.  HENT:     Head: Normocephalic.     Right Ear: Tympanic membrane, ear canal and external ear normal.     Left Ear: Tympanic membrane, ear canal and external ear normal.     Nose: Congestion present.     Mouth/Throat:     Pharynx: No oropharyngeal exudate or posterior oropharyngeal erythema.     Tonsils: No tonsillar exudate. 1+ on the right. 1+ on the left.  Eyes:     Extraocular Movements: Extraocular movements intact.  Cardiovascular:     Rate and Rhythm: Normal rate and regular rhythm.     Pulses: Normal pulses.     Heart sounds: Normal heart sounds.  Pulmonary:     Effort:  Pulmonary effort is normal.     Breath sounds: Normal breath sounds.  Musculoskeletal:     Cervical back: Normal range of motion.  Lymphadenopathy:     Cervical: Cervical adenopathy present.  Neurological:     Mental Status: She is alert and oriented to person, place, and time. Mental status is at baseline.      UC Treatments / Results  Labs (all labs ordered are listed, but only abnormal results are displayed) Labs Reviewed - No data to display  EKG   Radiology No results found.  Procedures Procedures (including critical care time)  Medications Ordered in UC Medications - No data to display  Initial Impression / Assessment and Plan / UC Course  I have reviewed the triage vital signs and the nursing notes.  Pertinent labs & imaging results that were available during my care of the patient were reviewed by me and considered in my medical decision making (see chart for details).  Acute bronchitis  Vital signs stable, O2 saturation 95% on room air, lungs clear to auscultation, stable for outpatient management, low suspicion for pneumonia therefore imaging deferred, will move forward with empirical treatment for bronchitis, prescribed prednisone  and albuterol inhaler, declined prescription for cough medicine, recommended over-the-counter medications and nonpharmacological measures for support, may follow-up for any worsening symptoms Final Clinical Impressions(s) / UC Diagnoses   Final diagnoses:  Acute bronchitis, unspecified organism   Discharge Instructions      Your symptoms today are most likely being caused by a virus and should steadily improve in time it can take up to 7 to 10 days before you truly start to see a turnaround however things will get better  Begin prednisone  every morning with food as directed to open and relax the airway, should settle shortness of breath and wheezing, will also help with soreness to the body, stop use of ibuprofen  during  treatment  You may take 2 puffs of albuterol inhaler every 6 hours as needed for wheezing or shortness of breath to make it easier for you to breathe    You can take Tylenol  as needed for fever reduction and pain relief.   For cough: honey 1/2 to 1 teaspoon (you can dilute the honey in water or another fluid).  You can also use guaifenesin and dextromethorphan for cough. You can use a humidifier for chest congestion and cough.  If you don't have a humidifier, you can sit in the bathroom with the hot shower running.      For sore throat: try warm salt water gargles, cepacol lozenges, throat spray, warm tea or water with lemon/honey, popsicles or ice, or OTC cold relief medicine for throat discomfort.   For congestion: take a daily anti-histamine like Zyrtec, Claritin, and a oral decongestant, such as pseudoephedrine.  You can also use Flonase  1-2 sprays in each nostril daily.   It is important to stay hydrated: drink plenty of fluids (water, gatorade/powerade/pedialyte, juices, or teas) to keep your throat moisturized and help further relieve irritation/discomfort.   ED Prescriptions     Medication Sig Dispense Auth. Provider   predniSONE  (STERAPRED UNI-PAK 21 TAB) 10 MG (21) TBPK tablet Take by mouth daily. Take 6 tabs by mouth daily  for 1 days, then 5 tabs for 1 days, then 4 tabs for 1 days, then 3 tabs for 1 days, 2 tabs for 1 days, then 1 tab by mouth daily for 1 days 21 tablet Decarlo Rivet, Maybelle Spatz, NP  albuterol (VENTOLIN HFA) 108 (90 Base) MCG/ACT inhaler Inhale 2 puffs into the lungs every 6 (six) hours as needed for wheezing or shortness of breath. 8.5 g Reena Canning, NP      PDMP not reviewed this encounter.   Reena Canning, [place] 12/06/23 [phone]

## 2023-12-09 ENCOUNTER — Ambulatory Visit (INDEPENDENT_AMBULATORY_CARE_PROVIDER_SITE_OTHER): Payer: Commercial Managed Care - PPO | Admitting: General Practice

## 2023-12-09 ENCOUNTER — Ambulatory Visit

## 2023-12-09 ENCOUNTER — Encounter: Payer: Self-pay | Admitting: General Practice

## 2023-12-09 VITALS — BP 120/82 | HR 64 | Temp 99.2°F | Ht 66.0 in | Wt 254.0 lb

## 2023-12-09 DIAGNOSIS — F419 Anxiety disorder, unspecified: Secondary | ICD-10-CM | POA: Diagnosis not present

## 2023-12-09 DIAGNOSIS — J208 Acute bronchitis due to other specified organisms: Secondary | ICD-10-CM | POA: Diagnosis not present

## 2023-12-09 DIAGNOSIS — B9689 Other specified bacterial agents as the cause of diseases classified elsewhere: Secondary | ICD-10-CM | POA: Diagnosis not present

## 2023-12-09 DIAGNOSIS — G43009 Migraine without aura, not intractable, without status migrainosus: Secondary | ICD-10-CM

## 2023-12-09 DIAGNOSIS — Z0000 Encounter for general adult medical examination without abnormal findings: Secondary | ICD-10-CM

## 2023-12-09 DIAGNOSIS — G43001 Migraine without aura, not intractable, with status migrainosus: Secondary | ICD-10-CM

## 2023-12-09 MED ORDER — BENZONATATE 200 MG PO CAPS: 200.0000 mg | ORAL_CAPSULE | Freq: Three times a day (TID) | ORAL | 0 refills | Status: DC | PRN

## 2023-12-09 MED ORDER — AZITHROMYCIN 250 MG PO TABS: ORAL_TABLET | ORAL | 0 refills | Status: AC

## 2023-12-09 MED ORDER — HYDROXYZINE HCL 10 MG PO TABS: 10.0000 mg | ORAL_TABLET | Freq: Three times a day (TID) | ORAL | 0 refills | Status: DC | PRN

## 2023-12-09 NOTE — Progress Notes (Signed)
 Established Patient Office Visit  Subjective   Patient ID: Sarah Gonzales, female    DOB: [birth year]  Age: 33 y.o. MRN: [id number]  Chief Complaint  Patient presents with   Cough    Congestion, sob, chest pains from coughing, headaches, body aches, fatigue x 7-8 days. Seen at [facility] and did covid test which was negative. Patient has been taking prednisone  pack which she is on day 4; inhaler; ibuprofen .    Anxiety    Patient states is getting worse; had a bad panic attack about 2 weeks ago.     HPI  Sarah Gonzales is a 33 year old female with past medical history of migraines, HTN, depression presents today for an acute visit to discuss cough and anxiety.   Cough: symptom onset was 8 days ago with congestion, shortness of breath, chest pain secondary to coughing, headaches, body aches, fatigue. She was evaluated at the UC on 12/06/23 where she tested negative for covid. She was diagnosed with acute bronchitis and was given prednisone  taper pack, she is currently on day 4. She was also given albuterol to use as needed. She has also tried Ibuprofen  for her symptoms which gave her minimal relief. She has also used her husbands Albuterol nebulizer which did give her some relief. She feels like she has some relief with prednisone . She has a productive cough with which is phlegm. She had a fever of 99.8 F last Tuesday. She denies any rhinorrhea, sneezing, shortness of breath, nausea or vomiting.   Anxiety: She has been going through a lot. She has been managed on Prozac  20 mg. Overall has been working well other than on days when someone gets sick. She has noticed that as soon as someone gets sick, she feels on edge. Her son was sick which caused her to have a panic attack. Then she thought her husband had the stomach virus and thought everyone was going to get it which caused her anxiety to worsen.   Patient Active Problem List   Diagnosis Date Noted   Anxiety 12/09/2023   Acute bacterial bronchitis  12/09/2023   Encounter for IUD removal 09/10/2023   Encounter for management of intrauterine contraceptive device (IUD) 09/10/2023   Arm pain, medial, left 09/10/2023   Benign essential hypertension 04/11/2021   Major depression, recurrent (HCC) 04/11/2021   BMI 40.0-44.9, adult (HCC) 12/14/2015   Migraine headache 07/07/2008   Past Medical History:  Diagnosis Date   Anxiety    Bartholin cyst    Chronic headaches    Since the age of 46, severe in the past.   De Quervain's tenosynovitis, bilateral 11/10/2021   Depression    Hypertension    Lumbar degenerative disc disease 11/10/2021   Migraines    Mild sleep apnea 12/05/2015   AHI of 5.4. Sleep study 2017.    AHI of 6.2 03/2021. HST     Warts    History reviewed. No pertinent surgical history. No Known Allergies       12/09/2023    8:22 AM 09/10/2023   12:08 PM 08/20/2023    3:40 PM  Depression screen PHQ 2/9  Decreased Interest 0 0 1  Down, Depressed, Hopeless 0 0 1  PHQ - 2 Score 0 0 2  Altered sleeping 1 0 0  Tired, decreased energy 0 0 1  Change in appetite 0 0 0  Feeling bad or failure about yourself  0 0 0  Trouble concentrating 0 0 0  Moving slowly or fidgety/restless 0  0 0  Suicidal thoughts 0 0 0  PHQ-9 Score 1 0 3  Difficult doing work/chores Not difficult at all Not difficult at all Not difficult at all       12/09/2023    8:22 AM 09/10/2023   12:08 PM 08/20/2023    3:40 PM 05/24/2022   12:38 PM  GAD 7 : Generalized Anxiety Score  Nervous, Anxious, on Edge 2 0 1 1  Control/stop worrying 1 0 0 1  Worry too much - different things 2 0 0 1  Trouble relaxing 1 0 0 1  Restless 0 0 0 0  Easily annoyed or irritable 1 0 0 1  Afraid - awful might happen 0 0 0 1  Total GAD 7 Score 7 0 1 6  Anxiety Difficulty Somewhat difficult Not difficult at all Not difficult at all Not difficult at all      Review of Systems  Constitutional:  Negative for chills and fever.  Respiratory:  Positive for cough. Negative for  shortness of breath and wheezing.   Cardiovascular:  Negative for chest pain.  Gastrointestinal:  Negative for abdominal pain, constipation, diarrhea, heartburn, nausea and vomiting.  Genitourinary:  Negative for dysuria, frequency and urgency.  Neurological:  Negative for dizziness and headaches.  Endo/Heme/Allergies:  Negative for polydipsia.  Psychiatric/Behavioral:  Negative for depression and suicidal ideas. The patient is not nervous/anxious.       Objective:     BP 120/82 (BP Location: Left Arm, Patient Position: Sitting, Cuff Size: Large)   Pulse 64   Temp 99.2 F (37.3 C) (Oral)   Ht 5\' 6"  (1.676 m)   Wt 254 lb (115.2 kg)   SpO2 98%   BMI 41.00 kg/m  BP Readings from Last 3 Encounters:  12/09/23 120/82  12/06/23 136/84  10/16/23 (!) 136/93   Wt Readings from Last 3 Encounters:  12/09/23 254 lb (115.2 kg)  10/16/23 259 lb 12.8 oz (117.8 kg)  10/04/23 257 lb (116.6 kg)      Physical Exam Vitals and nursing note reviewed.  Constitutional:      Appearance: Normal appearance.  HENT:     Right Ear: Tympanic membrane, ear canal and external ear normal.     Left Ear: Tympanic membrane, ear canal and external ear normal.     Nose: Congestion present.     Mouth/Throat:     Pharynx: Posterior oropharyngeal erythema present.  Eyes:     Conjunctiva/sclera: Conjunctivae normal.  Cardiovascular:     Rate and Rhythm: Normal rate and regular rhythm.     Pulses: Normal pulses.     Heart sounds: Normal heart sounds.  Pulmonary:     Effort: Pulmonary effort is normal.     Breath sounds: Normal breath sounds.  Neurological:     Mental Status: She is alert and oriented to person, place, and time.  Psychiatric:        Mood and Affect: Mood normal.        Behavior: Behavior normal.        Thought Content: Thought content normal.        Judgment: Judgment normal.      No results found for any visits on 12/09/23.     The ASCVD Risk score (Arnett DK, et al., 2019)  failed to calculate for the following reasons:   The 2019 ASCVD risk score is only valid for ages 66 to 5    Assessment & Plan:  Anxiety Assessment & Plan: Uncontrolled. Denies SI/HI.  Discussed treatment options at length.  At this time, she will benefit from PRN option added to her daily Prozac . As overall, she feels the current dose of Prozac  is effective other than when someone is sick in the family.   Start Hydroxyzine  10 mg TID PRN.  Discussed side effects.  F/u in four weeks for physical and anxiety.  Orders: -     hydrOXYzine  HCl; Take 1 tablet (10 mg total) by mouth 3 (three) times daily as needed.  Dispense: 30 tablet; Refill: 0  Acute bacterial bronchitis Assessment & Plan: Symptoms slightly better with prednisone . Exam stable. Coughing present with deep breaths on exam. Lungs clear.   Discussed finishing prednisone .  Continue albuterol.  Rx sent for zpack.  Rx sent for Benzonatate capsules for cough. Take 1 capsule by mouth three times daily as needed for cough. Discussed increasing fluid intake, rest, and using cool-mist humidifier.  Er precautions given.  Recommendations given for symptom management.  Consider chest x-ray if not better.   Orders: -     Azithromycin; Take 2 tablets on day 1, then 1 tablet daily on days 2 through 5  Dispense: 6 tablet; Refill: 0 -     Benzonatate; Take 1 capsule (200 mg total) by mouth 3 (three) times daily as needed.  Dispense: 20 capsule; Refill: 0  Migraine without aura and without status migrainosus, not intractable Assessment & Plan: Uncontrolled; however she has not started the Erenumab -aoee injections.   Continue Propanolol 80 mg once daily and Maxalt  PRN.      Return in about 4 weeks (around 01/06/2024) for physical.    Sarah Nation, NP

## 2023-12-09 NOTE — Patient Instructions (Addendum)
 You can try a few things over the counter to help with your symptoms including:  Cough: Delsym or Robitussin (get the off brand, works just as well) Chest Congestion: Mucinex (plain) Nasal Congestion/Ear Pressure/Sinus Pressure: Try using Flonase  (fluticasone ) nasal spray. Instill 1 spray in each nostril twice daily. This can be purchased over the counter. Body aches, fevers, headache: Ibuprofen  (not to exceed 2400 mg in 24 hours) or Acetaminophen -Tylenol  (not to exceed 3000 mg in 24 hours) Runny Nose/Throat Drainage/Sneezing/Itchy or Watery Eyes: An antihistamine such as Zyrtec, Claritin, Xyzal, Allegra  Start Benzonatate capsules for cough. Take 1 capsule by mouth three times daily as needed for cough.  Finish Prednisone .   Increase water intake. Cool mist humidifier Rest.  Start Azithromycin antibiotics for infection. Take 2 tablets by mouth today, then 1 tablet daily for 4 additional days.  Please reschedule physical.

## 2023-12-09 NOTE — Assessment & Plan Note (Addendum)
 Symptoms slightly better with prednisone . Exam stable. Coughing present with deep breaths on exam. Lungs clear.   Discussed finishing prednisone .  Continue albuterol.  Rx sent for zpack.  Rx sent for Benzonatate capsules for cough. Take 1 capsule by mouth three times daily as needed for cough. Discussed increasing fluid intake, rest, and using cool-mist humidifier.  Er precautions given.  Recommendations given for symptom management.  Consider chest x-ray if not better.

## 2023-12-09 NOTE — Assessment & Plan Note (Signed)
 Uncontrolled; however she has not started the Erenumab -aoee injections.   Continue Propanolol 80 mg once daily and Maxalt  PRN.

## 2023-12-09 NOTE — Assessment & Plan Note (Addendum)
 Uncontrolled. Denies SI/HI.  Discussed treatment options at length.  At this time, she will benefit from PRN option added to her daily Prozac . As overall, she feels the current dose of Prozac  is effective other than when someone is sick in the family.   Start Hydroxyzine  10 mg TID PRN.  Discussed side effects.  F/u in four weeks for physical and anxiety.

## 2023-12-16 ENCOUNTER — Ambulatory Visit: Attending: Obstetrics

## 2023-12-16 DIAGNOSIS — M62838 Other muscle spasm: Secondary | ICD-10-CM | POA: Diagnosis present

## 2023-12-16 DIAGNOSIS — R293 Abnormal posture: Secondary | ICD-10-CM | POA: Insufficient documentation

## 2023-12-16 DIAGNOSIS — R279 Unspecified lack of coordination: Secondary | ICD-10-CM | POA: Diagnosis present

## 2023-12-16 DIAGNOSIS — M6281 Muscle weakness (generalized): Secondary | ICD-10-CM | POA: Diagnosis present

## 2023-12-16 DIAGNOSIS — R102 Pelvic and perineal pain: Secondary | ICD-10-CM | POA: Diagnosis present

## 2023-12-16 DIAGNOSIS — M5459 Other low back pain: Secondary | ICD-10-CM | POA: Insufficient documentation

## 2023-12-16 NOTE — Therapy (Signed)
 OUTPATIENT PHYSICAL THERAPY FEMALE PELVIC TREATMENT   Patient Name: Sarah Gonzales MRN: [id number] DOB:02/16/1991, 33 y.o., female Today's Date: 12/16/2023  END OF SESSION:  PT End of Session - 12/16/23 0851     Visit Number 7    Date for PT Re-Evaluation 04/02/24    Authorization Type [employer]    PT Start Time 0845    PT Stop Time 0925    PT Time Calculation (min) 40 min    Activity Tolerance Patient tolerated treatment well    Behavior During Therapy [facility] for tasks assessed/performed             Past Medical History:  Diagnosis Date   Anxiety    Bartholin cyst    Chronic headaches    Since the age of 36, severe in the past.   De Quervain's tenosynovitis, bilateral 11/10/2021   Depression    Hypertension    Lumbar degenerative disc disease 11/10/2021   Migraines    Mild sleep apnea 12/05/2015   AHI of 5.4. Sleep study 2017.    AHI of 6.2 03/2021. HST     Warts    History reviewed. No pertinent surgical history. Patient Active Problem List   Diagnosis Date Noted   Anxiety 12/09/2023   Acute bacterial bronchitis 12/09/2023   Encounter for IUD removal 09/10/2023   Encounter for management of intrauterine contraceptive device (IUD) 09/10/2023   Arm pain, medial, left 09/10/2023   Benign essential hypertension 04/11/2021   Major depression, recurrent (HCC) 04/11/2021   BMI 40.0-44.9, adult (HCC) 12/14/2015   Migraine headache 07/07/2008    PCP: Jolanda Nation, NP  REFERRING PROVIDER: Phylliss Brenner, CNM   REFERRING DIAG: [phone] (ICD-10-CM) - Pelvic floor dysfunction  THERAPY DIAG:  Other muscle spasm  Muscle weakness (generalized)  Unspecified lack of coordination  Pelvic pain  Other low back pain  Abnormal posture  Rationale for Evaluation and Treatment: Rehabilitation  ONSET DATE: 2 years   SUBJECTIVE:                                                                                                                                                                                            SUBJECTIVE STATEMENT: Pt states that she has been very sick with bronchitis. She went through a box of 50 pads in 4 days and felt like she had very little control over bladder. Since being sick, she has continued wearing pads constantly due to increased in leaking.   PAIN: 12/16/23 Are you having pain? Yes - she states that she has degenerative disc disease NPRS scale: 0/10 Pain location: low back pain  Pain type: tight  Pain description: constant   Aggravating factors: standing, bending, lifting Relieving factors: sitting, rest  PRECAUTIONS: None  RED FLAGS: None   WEIGHT BEARING RESTRICTIONS: No  FALLS:  Has patient fallen in last 6 months? No  OCCUPATION: not working  ACTIVITY LEVEL : some exercise; walks 1-2 miles at a time   PLOF: Independent  PATIENT GOALS: improve bladder control; strengthen pelvic floor   PERTINENT HISTORY:  Anxiety, bartholin cyst, chronic headaches, HTN, depression, mild sleep apnea, anal fissure, degenerative disc disease    BOWEL MOVEMENT: Pain with bowel movement: No - history of anal fissure  Type of bowel movement:Frequency 1x/day and Strain none Fully empty rectum: Yes:   Leakage: No Pads: Yes: see below Fiber supplement/laxative No  URINATION: Pain with urination: No Fully empty bladder: No Stream: Strong Urgency: Yes  Frequency: every 1-2 hours; 1-2x/night Leakage: Urge to void, Walking to the bathroom, Coughing, Sneezing, and Laughing Pads: Yes: 2-3 a day  INTERCOURSE:  Ability to have vaginal penetration Yes  Pain with intercourse: none DrynessNo Climax: WNL Marinoff Scale: 0/3   PREGNANCY: Vaginal deliveries 1 Tearing Yes: 1 suture Episiotomy No C-section deliveries 0 Currently pregnant No  PROLAPSE: Pressure and Bulge   OBJECTIVE:  Note: Objective measures were completed at Evaluation unless otherwise noted.  10/17/23:  PATIENT SURVEYS:   PFIQ-7:  48  COGNITION: Overall cognitive status: Within functional limits for tasks assessed     SENSATION: Light touch: Appears intact   FUNCTIONAL TESTS:  Squat: WNL Single leg stance:  Rt: pelvic drop  Lt: pelvic drop Curl-up test: upper abdominal distortion    GAIT: Assistive device utilized: None Comments: WNL  POSTURE: rounded shoulders, forward head, decreased lumbar lordosis, increased thoracic kyphosis, and posterior pelvic tilt, and elevated Rt shoulder/Lt iliac crest   LUMBARAROM/PROM:  A/PROM A/PROM  Eval (% available)  Flexion 50  Extension 25  Right lateral flexion 50  Left lateral flexion 50  Right rotation 50  Left rotation 50   (Blank rows = not tested)  PALPATION:   General: tightness in bil lumbar paraspinals   Pelvic Alignment: WNL  Abdominal: decreased rib cage mobility                External Perineal Exam: dryness                             Internal Pelvic Floor: dryness, significant tension and tenderness reproducing pain/pressure, Lt>Rt  Patient confirms identification and approves PT to assess internal pelvic floor and treatment Yes  PELVIC MMT:   MMT eval  Vaginal 1/5, 2 seconds, 5 repeat contractions  Diastasis Recti 2 finger width separation  (Blank rows = not tested)        TONE: High, Lt>Rt - reproduced sensation of pressure/bulge  PROLAPSE: None detected in supine today - possible difficulty with coordinating due to high tension  TODAY'S TREATMENT:  DATE:  12/16/23 Neuromuscular re-education: Seated hip adduction ball press with transversus abdominus and pelvic floor muscle 2 x 10 Seated hip abduction green band with transversus abdominus and pelvic floor muscle 2 x 10 Seated resisted march green  band with transversus abdominus and pelvic floor muscle 2 x 10 Seated horizontal abduction red band 2 x  10 Exercises: Seated piriformis stretch 60 sec bil Seated open books 10x bil Therapeutic activities: Squats to table 2 x 10 Standing 3 way kick 10x each, bil   11/25/23 Neuromuscular re-education: Diaphragmatic breathing Supine with 10lb wt: 3 seconds x 4, 4 seconds x 4, 5 seconds x 4 Supine green band around lower rib cage: 3 seconds x 4, 4 seconds x 4, 5 seconds x 4 Therapeutic activities: Squats to table with red band around thighs 2 x 10 to table 3 way kick red band around thighs 10x each direction, bil Side stepping 4 laps with red band at thighs  Pt education provided on using pelvic support belt when she does activities that flare up low back pain - may help her be more comfortable and also allow us  to determine if instability continues to be part of her pain   11/12/23 Neuromuscular re-education: Bridge with hip adduction, transversus abdominus, and pelvic floor muscle 2 x 10 Bridge with hip abduction using red band, transversus abdominus, and pelvic floor muscle 2 x 10 Exercises: Side lying clam shells 2 x 10 bil Wide leg lower trunk rotation 2 x 10 Open books 10x bil Therapeutic activities: Review techniques for pelvic floor muscle wand use Sit<>stand 2 x 10   PATIENT EDUCATION:  Education details: See above Person educated: Patient Education method: Programmer, multimedia, Demonstration, Tactile cues, Verbal cues, and Handouts Education comprehension: verbalized understanding  HOME EXERCISE PROGRAM: [id number]  ASSESSMENT:  CLINICAL IMPRESSION: Pt had a lot of issues with leaking when she was sick; we discussed that this is not just pelvic floor muscle weakness, but also endurance and in general being weak and fatigued from long illness and coughing for days on ends. She did very well with reutrn to exercise today with focus on some sitting and some standing exercises, avoiding supine exercises due to this position often increasing pain. Overall she did very well today  without increased pain. She will continue to benefit from skilled PT intervention in order to decrease pelvic floor muscle tone, perform pelvic floor muscle strengthening, improve bladder control, and begin/progress functional core strengthening program.   OBJECTIVE IMPAIRMENTS: decreased activity tolerance, decreased coordination, decreased endurance, decreased mobility, decreased ROM, decreased strength, increased fascial restrictions, increased muscle spasms, impaired tone, postural dysfunction, and pain.   ACTIVITY LIMITATIONS: lifting, bending, standing, squatting, continence, and locomotion level  PARTICIPATION LIMITATIONS: cleaning, laundry, driving, community activity, and occupation  PERSONAL FACTORS: 1 comorbidity: medical history are also affecting patient's functional outcome.   REHAB POTENTIAL: Good  CLINICAL DECISION MAKING: Stable/uncomplicated  EVALUATION COMPLEXITY: Low   GOALS: Goals reviewed with patient? Yes  SHORT TERM GOALS: Target date: 11/14/2023 - updated 12/16/23    Pt will be independent with HEP.   Baseline: Goal status: MET 11/12/23  2.  Pt will be independent with the knack, urge suppression technique, and double voiding in order to improve bladder habits and decrease urinary incontinence.   Baseline:  Goal status: MET 11/12/23  3.  Pt will be able to correctly perform diaphragmatic breathing and appropriate pressure management in order to prevent worsening vaginal wall laxity and improve pelvic floor A/ROM.   Baseline:  Goal status: MET 11/12/23  4.  Pt will report 25% improvement in urinary incontinence.  Baseline:  Goal status: MET 11/12/23  5.  Pt will be independent with diaphragmatic breathing and down training activities in order to improve pelvic floor relaxation.  Baseline:  Goal status: MET 11/12/23   LONG TERM GOALS: Target date: 10/01/23 - updated 12/16/23  Pt will be independent with advanced HEP.   Baseline:  Goal status: IN  PROGRESS 12/16/23  2.  Pt will report 75% improvement in urinary incontinence.  Baseline:  Goal status: IN PROGRESS 12/16/23  3.  Pt will report no leaks with laughing, coughing, sneezing in order to improve comfort with interpersonal relationships and community activities.   Baseline:  Goal status: IN PROGRESS 12/16/23  4.  Pt will be able to go 2-3 hours in between voids without urgency or incontinence in order to improve QOL and perform all functional activities with less difficulty.   Baseline:  Goal status: IN PROGRESS 12/16/23  5.  Pt will present with normalized pelvic floor muscle tone and no tenderness.  Baseline:  Goal status: IN PROGRESS 12/16/23  6.  Pt will demonstrate normal pelvic floor muscle tone and A/ROM, able to achieve 4/5 strength with contractions and 10 sec endurance, in order to provide appropriate lumbopelvic support in functional activities.   Baseline:  Goal status: IN PROGRESS 12/16/23  PLAN:  PT FREQUENCY: 1-2x/week  PT DURATION: 6 months  PLANNED INTERVENTIONS: 97110-Therapeutic exercises, 97530- Therapeutic activity, 97112- Neuromuscular re-education, 97535- Self Care, 25366- Manual therapy, Dry Needling, and Biofeedback  PLAN FOR NEXT SESSION: Progress down training; progress stretches.    Verlena Glenn, PT, DPT05/19/259:25 AM

## 2023-12-18 ENCOUNTER — Other Ambulatory Visit: Payer: Self-pay | Admitting: Medical-Surgical

## 2023-12-18 NOTE — Telephone Encounter (Signed)
 Spoke with patient and she is taking the maxalt . The eletriptan  stopped working.

## 2023-12-29 ENCOUNTER — Other Ambulatory Visit: Payer: Self-pay | Admitting: Family Medicine

## 2023-12-30 ENCOUNTER — Encounter

## 2023-12-31 ENCOUNTER — Ambulatory Visit: Payer: Self-pay | Attending: Obstetrics

## 2023-12-31 DIAGNOSIS — M5459 Other low back pain: Secondary | ICD-10-CM | POA: Insufficient documentation

## 2023-12-31 DIAGNOSIS — R293 Abnormal posture: Secondary | ICD-10-CM | POA: Diagnosis present

## 2023-12-31 DIAGNOSIS — R102 Pelvic and perineal pain: Secondary | ICD-10-CM | POA: Insufficient documentation

## 2023-12-31 DIAGNOSIS — R279 Unspecified lack of coordination: Secondary | ICD-10-CM | POA: Insufficient documentation

## 2023-12-31 DIAGNOSIS — M6281 Muscle weakness (generalized): Secondary | ICD-10-CM | POA: Insufficient documentation

## 2023-12-31 DIAGNOSIS — M62838 Other muscle spasm: Secondary | ICD-10-CM | POA: Insufficient documentation

## 2023-12-31 NOTE — Therapy (Signed)
 OUTPATIENT PHYSICAL THERAPY FEMALE PELVIC TREATMENT   Patient Name: Sarah Gonzales MRN: [id number] DOB:27-Aug-1990, 33 y.o., female Today's Date: 12/31/2023  END OF SESSION:  PT End of Session - 12/31/23 1021     Visit Number 8    Date for PT Re-Evaluation 04/02/24    Authorization Type [employer]    PT Start Time 1015    PT Stop Time 1055    PT Time Calculation (min) 40 min    Activity Tolerance Patient tolerated treatment well    Behavior During Therapy WFL for tasks assessed/performed             Past Medical History:  Diagnosis Date   Anxiety    Bartholin cyst    Chronic headaches    Since the age of 70, severe in the past.   De Quervain's tenosynovitis, bilateral 11/10/2021   Depression    Hypertension    Lumbar degenerative disc disease 11/10/2021   Migraines    Mild sleep apnea 12/05/2015   AHI of 5.4. Sleep study 2017.    AHI of 6.2 03/2021. HST     Warts    History reviewed. No pertinent surgical history. Patient Active Problem List   Diagnosis Date Noted   Anxiety 12/09/2023   Acute bacterial bronchitis 12/09/2023   Encounter for IUD removal 09/10/2023   Encounter for management of intrauterine contraceptive device (IUD) 09/10/2023   Arm pain, medial, left 09/10/2023   Benign essential hypertension 04/11/2021   Major depression, recurrent (HCC) 04/11/2021   BMI 40.0-44.9, adult (HCC) 12/14/2015   Migraine headache 07/07/2008    PCP: Jolanda Nation, NP  REFERRING PROVIDER: Phylliss Brenner, CNM   REFERRING DIAG: [phone] (ICD-10-CM) - Pelvic floor dysfunction  THERAPY DIAG:  Other muscle spasm  Muscle weakness (generalized)  Unspecified lack of coordination  Pelvic pain  Other low back pain  Abnormal posture  Rationale for Evaluation and Treatment: Rehabilitation  ONSET DATE: 2 years   SUBJECTIVE:                                                                                                                                                                                            SUBJECTIVE STATEMENT: Pt states that she has not been able to stop wearing a pad since she was sick.   PAIN: 12/30/23 Are you having pain? Yes - she states that she has degenerative disc disease NPRS scale: 0/10 Pain location: low back pain  Pain type: tight Pain description: constant   Aggravating factors: standing, bending, lifting Relieving factors: sitting, rest  PRECAUTIONS: None  RED FLAGS: None   WEIGHT BEARING RESTRICTIONS: No  FALLS:  Has patient fallen in last 6 months? No  OCCUPATION: not working  ACTIVITY LEVEL : some exercise; walks 1-2 miles at a time   PLOF: Independent  PATIENT GOALS: improve bladder control; strengthen pelvic floor   PERTINENT HISTORY:  Anxiety, bartholin cyst, chronic headaches, HTN, depression, mild sleep apnea, anal fissure, degenerative disc disease    BOWEL MOVEMENT: Pain with bowel movement: No - history of anal fissure  Type of bowel movement:Frequency 1x/day and Strain none Fully empty rectum: Yes:   Leakage: No Pads: Yes: see below Fiber supplement/laxative No  URINATION: Pain with urination: No Fully empty bladder: No Stream: Strong Urgency: Yes  Frequency: every 1-2 hours; 1-2x/night Leakage: Urge to void, Walking to the bathroom, Coughing, Sneezing, and Laughing Pads: Yes: 2-3 a day  INTERCOURSE:  Ability to have vaginal penetration Yes  Pain with intercourse: none DrynessNo Climax: WNL Marinoff Scale: 0/3   PREGNANCY: Vaginal deliveries 1 Tearing Yes: 1 suture Episiotomy No C-section deliveries 0 Currently pregnant No  PROLAPSE: Pressure and Bulge   OBJECTIVE:  Note: Objective measures were completed at Evaluation unless otherwise noted.  10/17/23:  PATIENT SURVEYS:   PFIQ-7: 48  COGNITION: Overall cognitive status: Within functional limits for tasks assessed     SENSATION: Light touch: Appears intact   FUNCTIONAL TESTS:  Squat: WNL Single leg  stance:  Rt: pelvic drop  Lt: pelvic drop Curl-up test: upper abdominal distortion    GAIT: Assistive device utilized: None Comments: WNL  POSTURE: rounded shoulders, forward head, decreased lumbar lordosis, increased thoracic kyphosis, and posterior pelvic tilt, and elevated Rt shoulder/Lt iliac crest   LUMBARAROM/PROM:  A/PROM A/PROM  Eval (% available)  Flexion 50  Extension 25  Right lateral flexion 50  Left lateral flexion 50  Right rotation 50  Left rotation 50   (Blank rows = not tested)  PALPATION:   General: tightness in bil lumbar paraspinals   Pelvic Alignment: WNL  Abdominal: decreased rib cage mobility                External Perineal Exam: dryness                             Internal Pelvic Floor: dryness, significant tension and tenderness reproducing pain/pressure, Lt>Rt  Patient confirms identification and approves PT to assess internal pelvic floor and treatment Yes  PELVIC MMT:   MMT eval  Vaginal 1/5, 2 seconds, 5 repeat contractions  Diastasis Recti 2 finger width separation  (Blank rows = not tested)        TONE: High, Lt>Rt - reproduced sensation of pressure/bulge  PROLAPSE: None detected in supine today - possible difficulty with coordinating due to high tension  TODAY'S TREATMENT:                                                                                                                              DATE:  12/31/23 Manual: Pt provides verbal consent for internal vaginal/rectal pelvic floor exam. Internal vaginal pelvic floor muscle reassessment Internal vaginal pelvic floor muscle release to bil superficial and deep muscle layers, Lt>Rt Myofascial release in sidelying to bil adductors  Neuromuscular re-education: Pelvic floor muscle contraction training with breath coordination - quick flicks 2 x 10 Diaphragmatic breathing for improved pelvic floor muscle movement/relaxation   12/16/23 Neuromuscular re-education: Seated hip  adduction ball press with transversus abdominus and pelvic floor muscle 2 x 10 Seated hip abduction green band with transversus abdominus and pelvic floor muscle 2 x 10 Seated resisted march green  band with transversus abdominus and pelvic floor muscle 2 x 10 Seated horizontal abduction red band 2 x 10 Exercises: Seated piriformis stretch 60 sec bil Seated open books 10x bil Therapeutic activities: Squats to table 2 x 10 Standing 3 way kick 10x each, bil   11/25/23 Neuromuscular re-education: Diaphragmatic breathing Supine with 10lb wt: 3 seconds x 4, 4 seconds x 4, 5 seconds x 4 Supine green band around lower rib cage: 3 seconds x 4, 4 seconds x 4, 5 seconds x 4 Therapeutic activities: Squats to table with red band around thighs 2 x 10 to table 3 way kick red band around thighs 10x each direction, bil Side stepping 4 laps with red band at thighs  Pt education provided on using pelvic support belt when she does activities that flare up low back pain - may help her be more comfortable and also allow us  to determine if instability continues to be part of her pain   PATIENT EDUCATION:  Education details: See above Person educated: Patient Education method: Explanation, Demonstration, Tactile cues, Verbal cues, and Handouts Education comprehension: verbalized understanding  HOME EXERCISE PROGRAM: [id number]  ASSESSMENT:  CLINICAL IMPRESSION: Pt states that she has continued to have much more urinary incontinence since she was sick three weeks ago. She has not been performing any pelvic floor muscle contractions and not many of her other exercises. Due to this leaking, we performed internal vaginal pelvic floor muscle assessment this session to re-evaluate what may be responsible for increase in leaking. She demonstrated significant superficial and deep pelvic floor muscle tension and pain. We performed manual release of restriction, worse on Lt compared to Rt; she reported that on Lt  she had referral into low back that reproduced her typical low back pain. Good, but painful, tolerance to pelvic floor muscle release. We discussed reason for leaking is likely due to pelvic floor muscles being over worked by all the coughing and leading to chronic clenching and the pelvic floor muscles also being tight and fatigued. She was encouraged to perform pelvic floor muscle contractions in seated or supine with breathing only 2x/day in order to help improve proprioception of relaxation vs contraction. She will also work on performing more down training at home and active relaxation. She will continue to benefit from skilled PT intervention in order to decrease pelvic floor muscle tone, perform pelvic floor muscle strengthening, improve bladder control, and begin/progress functional core strengthening program.   OBJECTIVE IMPAIRMENTS: decreased activity tolerance, decreased coordination, decreased endurance, decreased mobility, decreased ROM, decreased strength, increased fascial restrictions, increased muscle spasms, impaired tone, postural dysfunction, and pain.   ACTIVITY LIMITATIONS: lifting, bending, standing, squatting, continence, and locomotion level  PARTICIPATION LIMITATIONS: cleaning, laundry, driving, community activity, and occupation  PERSONAL FACTORS: 1 comorbidity: medical history are also affecting patient's functional outcome.   REHAB POTENTIAL: Good  CLINICAL DECISION MAKING: Stable/uncomplicated  EVALUATION COMPLEXITY:  Low   GOALS: Goals reviewed with patient? Yes  SHORT TERM GOALS: Target date: 11/14/2023 - updated 12/16/23    Pt will be independent with HEP.   Baseline: Goal status: MET 11/12/23  2.  Pt will be independent with the knack, urge suppression technique, and double voiding in order to improve bladder habits and decrease urinary incontinence.   Baseline:  Goal status: MET 11/12/23  3.  Pt will be able to correctly perform diaphragmatic breathing  and appropriate pressure management in order to prevent worsening vaginal wall laxity and improve pelvic floor A/ROM.   Baseline:  Goal status: MET 11/12/23  4.  Pt will report 25% improvement in urinary incontinence.  Baseline:  Goal status: MET 11/12/23  5.  Pt will be independent with diaphragmatic breathing and down training activities in order to improve pelvic floor relaxation.  Baseline:  Goal status: MET 11/12/23   LONG TERM GOALS: Target date: 10/01/23 - updated 12/16/23  Pt will be independent with advanced HEP.   Baseline:  Goal status: IN PROGRESS 12/16/23  2.  Pt will report 75% improvement in urinary incontinence.  Baseline:  Goal status: IN PROGRESS 12/16/23  3.  Pt will report no leaks with laughing, coughing, sneezing in order to improve comfort with interpersonal relationships and community activities.   Baseline:  Goal status: IN PROGRESS 12/16/23  4.  Pt will be able to go 2-3 hours in between voids without urgency or incontinence in order to improve QOL and perform all functional activities with less difficulty.   Baseline:  Goal status: IN PROGRESS 12/16/23  5.  Pt will present with normalized pelvic floor muscle tone and no tenderness.  Baseline:  Goal status: IN PROGRESS 12/16/23  6.  Pt will demonstrate normal pelvic floor muscle tone and A/ROM, able to achieve 4/5 strength with contractions and 10 sec endurance, in order to provide appropriate lumbopelvic support in functional activities.   Baseline:  Goal status: IN PROGRESS 12/16/23  PLAN:  PT FREQUENCY: 1-2x/week  PT DURATION: 6 months  PLANNED INTERVENTIONS: 97110-Therapeutic exercises, 97530- Therapeutic activity, 97112- Neuromuscular re-education, 97535- Self Care, 16109- Manual therapy, Dry Needling, and Biofeedback  PLAN FOR NEXT SESSION: Progress down training; progress stretches.    Verlena Glenn, PT, DPT06/09/2509:17 AM

## 2024-01-06 ENCOUNTER — Ambulatory Visit (INDEPENDENT_AMBULATORY_CARE_PROVIDER_SITE_OTHER): Admitting: General Practice

## 2024-01-06 ENCOUNTER — Encounter: Payer: Self-pay | Admitting: General Practice

## 2024-01-06 VITALS — BP 122/86 | HR 85 | Temp 98.2°F | Ht 65.5 in | Wt 259.0 lb

## 2024-01-06 DIAGNOSIS — Z6841 Body Mass Index (BMI) 40.0 and over, adult: Secondary | ICD-10-CM | POA: Diagnosis not present

## 2024-01-06 DIAGNOSIS — E782 Mixed hyperlipidemia: Secondary | ICD-10-CM | POA: Insufficient documentation

## 2024-01-06 DIAGNOSIS — I10 Essential (primary) hypertension: Secondary | ICD-10-CM

## 2024-01-06 DIAGNOSIS — Z0000 Encounter for general adult medical examination without abnormal findings: Secondary | ICD-10-CM | POA: Diagnosis not present

## 2024-01-06 DIAGNOSIS — F419 Anxiety disorder, unspecified: Secondary | ICD-10-CM

## 2024-01-06 DIAGNOSIS — G43009 Migraine without aura, not intractable, without status migrainosus: Secondary | ICD-10-CM

## 2024-01-06 NOTE — Assessment & Plan Note (Signed)
 Controlled.  Has not started the Erenumab .  Continue Propanolol 80 mg once daily and Maxalt  PRN.  Will let me know if she needs refill.

## 2024-01-06 NOTE — Assessment & Plan Note (Signed)
 Last lipid panel was in 2023. She was pregnant at the time.   She is not fasting today.   Lipid panel order placed.

## 2024-01-06 NOTE — Assessment & Plan Note (Addendum)
 At goal.   Continue Propanolol.   F/u in 6 months.

## 2024-01-06 NOTE — Assessment & Plan Note (Addendum)
 Controlled.  Continue Hydroxyzine  10 mg once daily.   F/u in 6 months.

## 2024-01-06 NOTE — Progress Notes (Signed)
 Established Patient Office Visit  Subjective   Patient ID: Sarah Gonzales, female    DOB: 01-06-1991  Age: 33 y.o. MRN: [id number]  Chief Complaint  Patient presents with   Annual Exam    Had a rash on her face and chest after being on the steroids.    Cough    Still has cough from illness a few weeks ago.     Cough Pertinent negatives include no chest pain, chills, ear pain, eye redness, fever, headaches, heartburn, myalgias, rash, sore throat, shortness of breath, weight loss or wheezing.    Sarah Gonzales is a 33 year old female with past medical history of migraine, HTN, depression, anxiety, obesity presents today for complete physical and follow up of chronic conditions.  She was evaluated on 12/09/23 for cough and she was given zpack for five days along with benzonatate  and hydroxyzine  for anxiety. She develop a rash around her face. She took Claritin and face wash. She thought it was a heat rash and she looked up on Google and thinks that it could be a steroid rash. She was also given prednisone  pack on 12/06/23, which she was still taking. Does not suspect it was hydroxyzine  as she took it another time and did not get a rash. She has taken prednisone  before without developing a rash. She is not sure if it was a zpack. The rash lasted for a week.   Immunizations: -Tetanus: Completed in 2023  Diet: Fair diet.  Exercise: No regular exercise.  Eye exam: Completed two years ago.  Dental exam: Completes semi-annually    Pap Smear: Completed in 2021- due next year.; established with gyn.  Patient Active Problem List   Diagnosis Date Noted   Mixed hyperlipidemia 01/06/2024   Encounter for screening and preventative care 01/06/2024   Anxiety 12/09/2023   Benign essential hypertension 04/11/2021   Major depression, recurrent (HCC) 04/11/2021   BMI 40.0-44.9, adult (HCC) 12/14/2015   Migraine headache 07/07/2008   Past Medical History:  Diagnosis Date   Anxiety    Bartholin cyst     Chronic headaches    Since the age of 16, severe in the past.   De Quervain's tenosynovitis, bilateral 11/10/2021   Depression    Hypertension    Lumbar degenerative disc disease 11/10/2021   Migraines    Mild sleep apnea 12/05/2015   AHI of 5.4. Sleep study 2017.    AHI of 6.2 03/2021. HST     Warts    History reviewed. No pertinent surgical history. Allergies  Allergen Reactions   Azithromycin  Rash         01/06/2024   11:08 AM 12/09/2023    8:22 AM 09/10/2023   12:08 PM  Depression screen PHQ 2/9  Decreased Interest 0 0 0  Down, Depressed, Hopeless 0 0 0  PHQ - 2 Score 0 0 0  Altered sleeping  1 0  Tired, decreased energy  0 0  Change in appetite  0 0  Feeling bad or failure about yourself   0 0  Trouble concentrating  0 0  Moving slowly or fidgety/restless  0 0  Suicidal thoughts  0 0  PHQ-9 Score  1 0  Difficult doing work/chores  Not difficult at all Not difficult at all       12/09/2023    8:22 AM 09/10/2023   12:08 PM 08/20/2023    3:40 PM 05/24/2022   12:38 PM  GAD 7 : Generalized Anxiety Score  Nervous, Anxious,  on Edge 2 0 1 1  Control/stop worrying 1 0 0 1  Worry too much - different things 2 0 0 1  Trouble relaxing 1 0 0 1  Restless 0 0 0 0  Easily annoyed or irritable 1 0 0 1  Afraid - awful might happen 0 0 0 1  Total GAD 7 Score 7 0 1 6  Anxiety Difficulty Somewhat difficult Not difficult at all Not difficult at all Not difficult at all      Review of Systems  Constitutional:  Negative for chills, fever, malaise/fatigue and weight loss.  HENT:  Negative for congestion, ear discharge, ear pain, hearing loss, nosebleeds, sinus pain, sore throat and tinnitus.   Eyes:  Negative for blurred vision, double vision, pain, discharge and redness.  Respiratory:  Positive for cough. Negative for shortness of breath, wheezing and stridor.   Cardiovascular:  Negative for chest pain, palpitations and leg swelling.  Gastrointestinal:  Negative for abdominal  pain, constipation, diarrhea, heartburn, nausea and vomiting.  Genitourinary:  Negative for dysuria, frequency and urgency.  Musculoskeletal:  Negative for myalgias.  Skin:  Negative for rash.  Neurological:  Negative for dizziness, tingling, seizures, weakness and headaches.  Psychiatric/Behavioral:  Negative for depression, substance abuse and suicidal ideas. The patient is not nervous/anxious.       Objective:     BP 122/86 (BP Location: Left Arm, Patient Position: Sitting, Cuff Size: Large)   Pulse 85   Temp 98.2 F (36.8 C) (Oral)   Ht 5' 5.5" (1.664 m)   Wt 259 lb (117.5 kg)   LMP 12/22/2023   SpO2 97%   BMI 42.44 kg/m  BP Readings from Last 3 Encounters:  01/06/24 122/86  12/09/23 120/82  12/06/23 136/84   Wt Readings from Last 3 Encounters:  01/06/24 259 lb (117.5 kg)  12/09/23 254 lb (115.2 kg)  10/16/23 259 lb 12.8 oz (117.8 kg)      Physical Exam Vitals and nursing note reviewed.  Constitutional:      Appearance: Normal appearance.  HENT:     Head: Normocephalic and atraumatic.     Right Ear: Tympanic membrane, ear canal and external ear normal.     Left Ear: Tympanic membrane, ear canal and external ear normal.     Nose: Nose normal.     Mouth/Throat:     Mouth: Mucous membranes are moist.     Pharynx: Oropharynx is clear.  Eyes:     Conjunctiva/sclera: Conjunctivae normal.     Pupils: Pupils are equal, round, and reactive to light.  Cardiovascular:     Rate and Rhythm: Normal rate and regular rhythm.     Pulses: Normal pulses.     Heart sounds: Normal heart sounds.  Pulmonary:     Effort: Pulmonary effort is normal.     Breath sounds: Normal breath sounds.  Abdominal:     General: Abdomen is flat. Bowel sounds are normal.     Palpations: Abdomen is soft.  Musculoskeletal:        General: Normal range of motion.     Cervical back: Normal range of motion.  Skin:    General: Skin is warm and dry.     Capillary Refill: Capillary refill takes  less than 2 seconds.  Neurological:     General: No focal deficit present.     Mental Status: She is alert and oriented to person, place, and time. Mental status is at baseline.  Psychiatric:  Mood and Affect: Mood normal.        Behavior: Behavior normal.        Thought Content: Thought content normal.        Judgment: Judgment normal.      No results found for any visits on 01/06/24.     The ASCVD Risk score (Arnett DK, et al., 2019) failed to calculate for the following reasons:   The 2019 ASCVD risk score is only valid for ages 80 to 1    Assessment & Plan:  Encounter for screening and preventative care Assessment & Plan: Immunizations UTD. Pap smear UTD.  Discussed the importance of a healthy diet and regular exercise in order for weight loss, and to reduce the risk of further co-morbidity.  Exam stable. Labs pending.  Follow up in 1 year for repeat physical.   Orders: -     CBC; Future -     Comprehensive metabolic panel with GFR; Future -     TSH; Future -     Hemoglobin A1c; Future -     Lipid panel; Future  BMI 40.0-44.9, adult [facility]) Assessment & Plan: Discussed the importance of healthy diet and exercise to affect sustainable weight loss.    Orders: -     CBC; Future -     Comprehensive metabolic panel with GFR; Future -     TSH; Future -     Hemoglobin A1c; Future -     Lipid panel; Future  Mixed hyperlipidemia Assessment & Plan: Last lipid panel was in 2023. She was pregnant at the time.   She is not fasting today.   Lipid panel order placed.  Orders: -     CBC; Future -     Comprehensive metabolic panel with GFR; Future -     TSH; Future -     Hemoglobin A1c; Future -     Lipid panel; Future  Anxiety Assessment & Plan: Controlled.  Continue Hydroxyzine  10 mg once daily.   F/u in 6 months.   Migraine without aura and without status migrainosus, not intractable Assessment & Plan: Controlled.  Has not started the  Erenumab .  Continue Propanolol 80 mg once daily and Maxalt  PRN.  Will let me know if she needs refill.    Benign essential hypertension Assessment & Plan: At goal.   Continue Propanolol.   F/u in 6 months.     Return in about 6 months (around 07/07/2024) for chronic care management.Jolanda Nation, NP

## 2024-01-06 NOTE — Patient Instructions (Addendum)
 Please schedule lab ONLY appt for later this week. Come fasting.   Let me know about refills.  F/u on chronic care management in 6 months.   It was a pleasure to see you today!

## 2024-01-06 NOTE — Assessment & Plan Note (Signed)
Immunizations UTD. Pap smear UTD  Discussed the importance of a healthy diet and regular exercise in order for weight loss, and to reduce the risk of further co-morbidity.  Exam stable. Labs pending.  Follow up in 1 year for repeat physical.  

## 2024-01-06 NOTE — Assessment & Plan Note (Signed)
 Discussed the importance of healthy diet and exercise to affect sustainable weight loss.

## 2024-01-08 ENCOUNTER — Encounter

## 2024-01-09 ENCOUNTER — Other Ambulatory Visit

## 2024-01-09 DIAGNOSIS — Z0000 Encounter for general adult medical examination without abnormal findings: Secondary | ICD-10-CM

## 2024-01-09 DIAGNOSIS — Z6841 Body Mass Index (BMI) 40.0 and over, adult: Secondary | ICD-10-CM

## 2024-01-09 DIAGNOSIS — E782 Mixed hyperlipidemia: Secondary | ICD-10-CM

## 2024-01-09 NOTE — Addendum Note (Signed)
 Addended by: Gerry Krone on: 01/09/2024 07:45 AM   Modules accepted: Orders

## 2024-01-10 ENCOUNTER — Ambulatory Visit: Payer: Self-pay | Admitting: General Practice

## 2024-01-10 LAB — CBC
HCT: 43.7 % (ref 35.0–45.0)
Hemoglobin: 14.2 g/dL (ref 11.7–15.5)
MCH: 26.4 pg — ABNORMAL LOW (ref 27.0–33.0)
MCHC: 32.5 g/dL (ref 32.0–36.0)
MCV: 81.2 fL (ref 80.0–100.0)
MPV: 9 fL (ref 7.5–12.5)
Platelets: 304 10*3/uL (ref 140–400)
RBC: 5.38 10*6/uL — ABNORMAL HIGH (ref 3.80–5.10)
RDW: 13.7 % (ref 11.0–15.0)
WBC: 7.3 10*3/uL (ref 3.8–10.8)

## 2024-01-10 LAB — COMPREHENSIVE METABOLIC PANEL WITH GFR
AG Ratio: 1.5 (calc) (ref 1.0–2.5)
ALT: 24 U/L (ref 6–29)
AST: 21 U/L (ref 10–30)
Albumin: 4.4 g/dL (ref 3.6–5.1)
Alkaline phosphatase (APISO): 73 U/L (ref 31–125)
BUN: 11 mg/dL (ref 7–25)
CO2: 26 mmol/L (ref 20–32)
Calcium: 9.7 mg/dL (ref 8.6–10.2)
Chloride: 100 mmol/L (ref 98–110)
Creat: 0.64 mg/dL (ref 0.50–0.97)
Globulin: 2.9 g/dL (ref 1.9–3.7)
Glucose, Bld: 91 mg/dL (ref 65–99)
Potassium: 4.1 mmol/L (ref 3.5–5.3)
Sodium: 137 mmol/L (ref 135–146)
Total Bilirubin: 0.4 mg/dL (ref 0.2–1.2)
Total Protein: 7.3 g/dL (ref 6.1–8.1)
eGFR: 120 mL/min/{1.73_m2} (ref >=60)

## 2024-01-10 LAB — LIPID PANEL
Cholesterol: 226 mg/dL — ABNORMAL HIGH (ref <200)
HDL: 46 mg/dL — ABNORMAL LOW (ref >=50)
LDL Cholesterol (Calc): 143 mg/dL — ABNORMAL HIGH
Non-HDL Cholesterol (Calc): 180 mg/dL — ABNORMAL HIGH (ref <130)
Total CHOL/HDL Ratio: 4.9 (calc) (ref <5.0)
Triglycerides: 229 mg/dL — ABNORMAL HIGH (ref <150)

## 2024-01-10 LAB — HEMOGLOBIN A1C
Hgb A1c MFr Bld: 5.4 % (ref <5.7)
Mean Plasma Glucose: 108 mg/dL
eAG (mmol/L): 6 mmol/L

## 2024-01-10 LAB — TSH: TSH: 1.39 m[IU]/L

## 2024-01-15 ENCOUNTER — Encounter

## 2024-01-23 ENCOUNTER — Other Ambulatory Visit: Payer: Self-pay | Admitting: Medical-Surgical

## 2024-03-19 ENCOUNTER — Other Ambulatory Visit: Payer: Self-pay | Admitting: Medical-Surgical

## 2024-03-24 ENCOUNTER — Other Ambulatory Visit: Payer: Self-pay | Admitting: Family Medicine

## 2024-03-31 ENCOUNTER — Encounter: Payer: Self-pay | Admitting: Sports Medicine

## 2024-04-18 ENCOUNTER — Other Ambulatory Visit: Payer: Self-pay | Admitting: General Practice

## 2024-05-07 IMAGING — MR MR LUMBAR SPINE W/O CM
4 of 5 series · 26 of 48 positions shown · non-contrast
Comparison: X-ray 11/10/2021

CLINICAL DATA: Lumbar radiculopathy, symptoms persist with > 6 wks
treatment Right L4 radiculitis

EXAM:
MRI LUMBAR SPINE WITHOUT CONTRAST
TECHNIQUE: Multiplanar, multisequence MR imaging of the lumbar spine was
performed. No intravenous contrast was administered.

[Series 3: T2 · sagittal · 4.0mm · 0.81mm/px · 6 of 15 slices shown (1 of 2)]
[im 1/15]
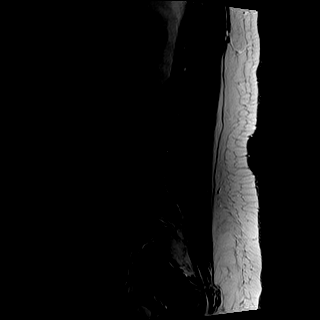
[im 3/15]
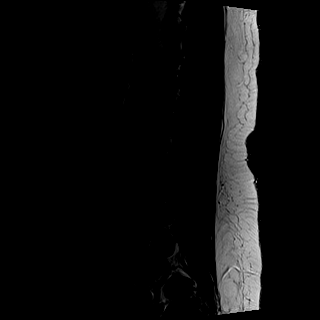
[im 6/15]
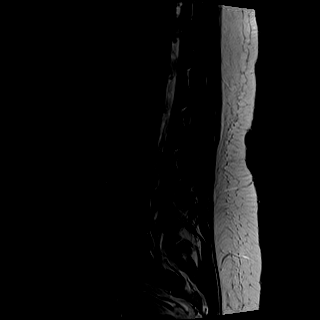
[im 9/15]
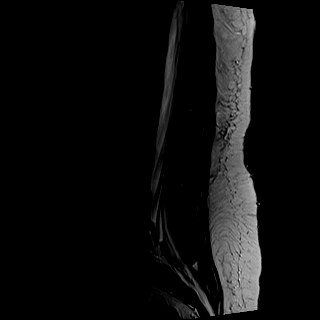
[im 12/15]
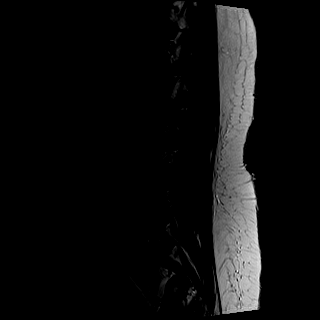
[im 15/15]
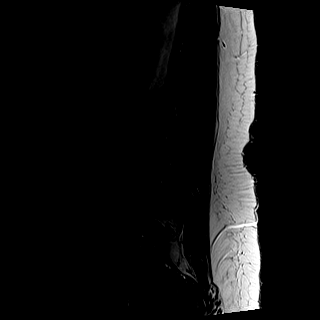

[Series 4: T1 · sagittal · 4.0mm · 0.41mm/px · 6 of 15 slices shown (1 of 2)]
[im 1/15]
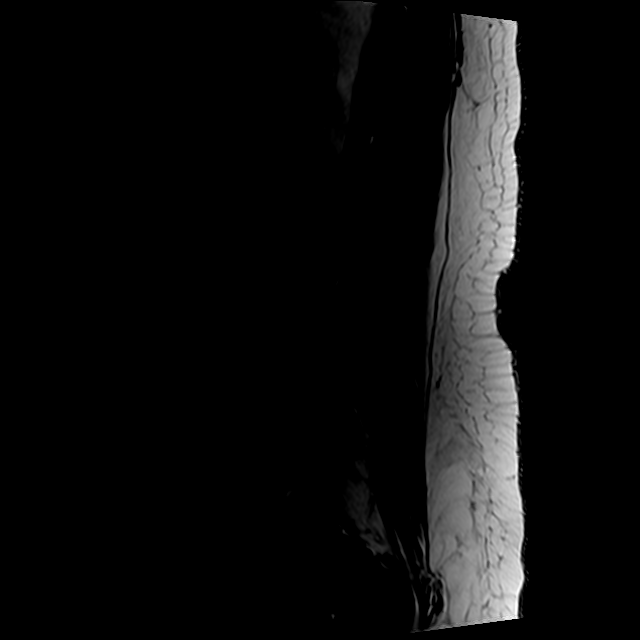
[im 3/15]
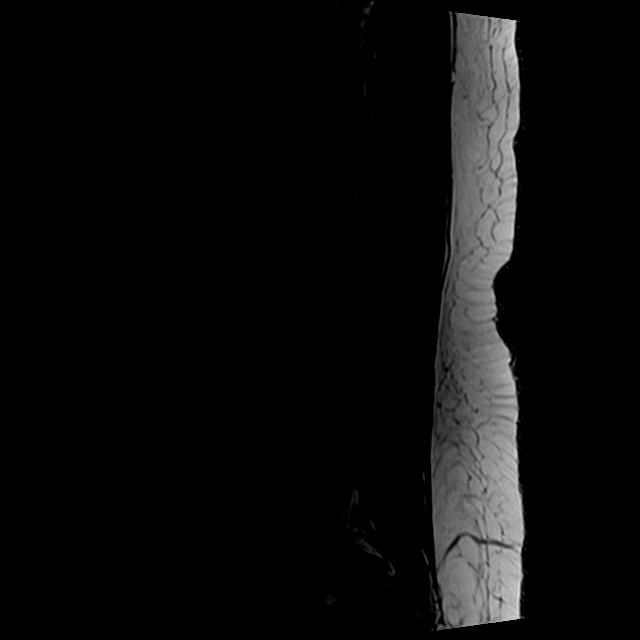
[im 6/15]
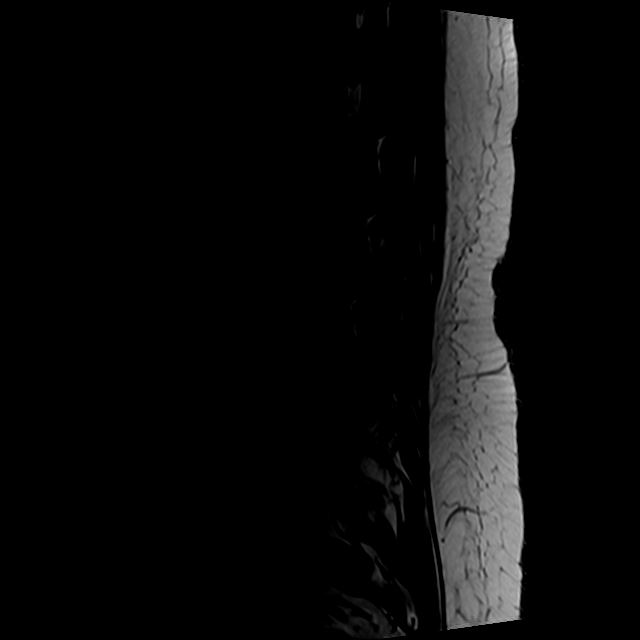
[im 9/15]
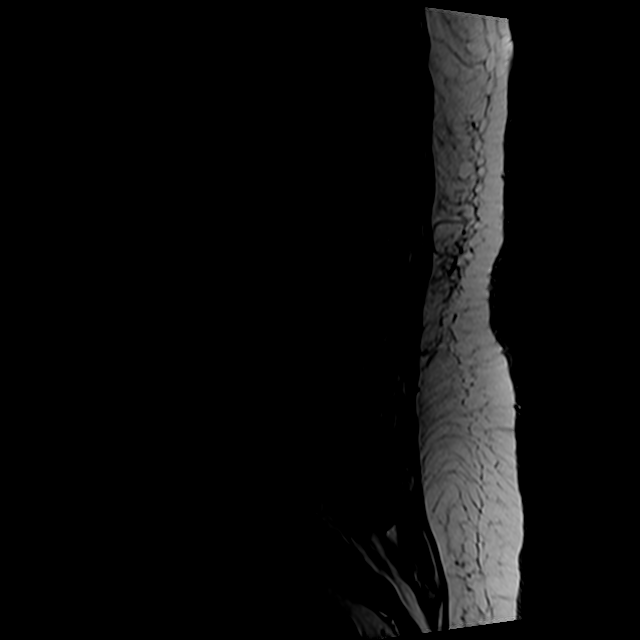
[im 12/15]
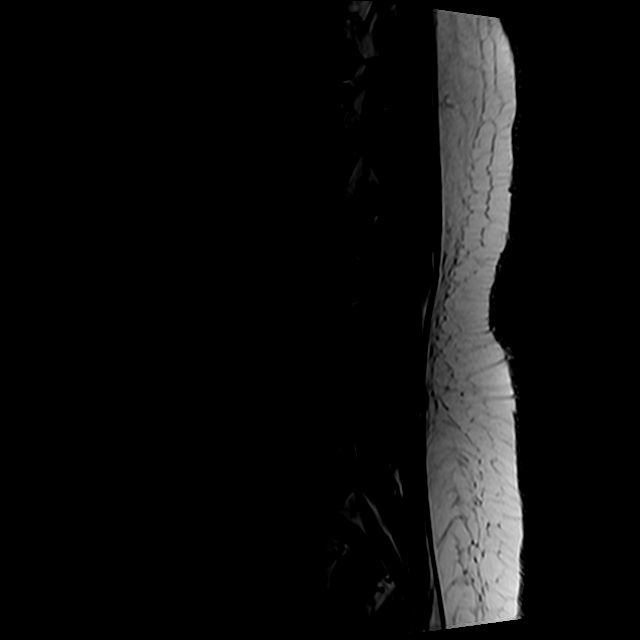
[im 15/15]
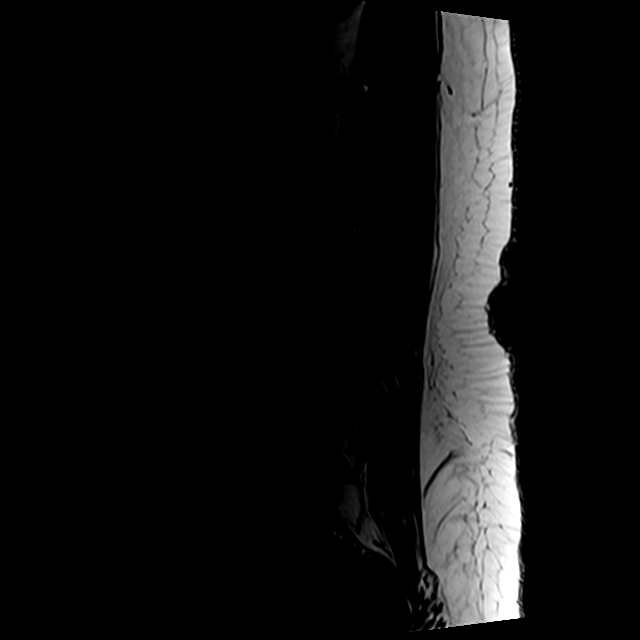

[Series 6: T2 · axial · 4.0mm · 0.78mm/px · z∈[-12,+192]mm · 9 of 38 slices shown (2 of 2)]
[im 1/38]
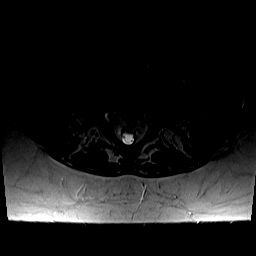
[im 6/38]
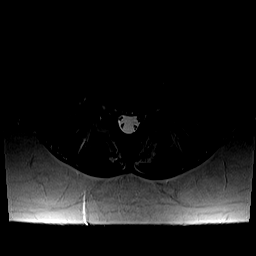
[im 11/38]
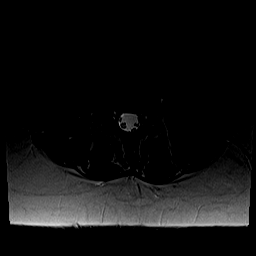
[im 16/38]
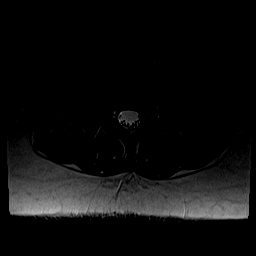
[im 19/38]
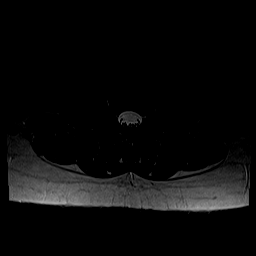
[im 22/38]
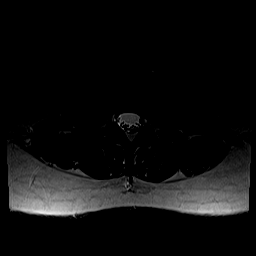
[im 27/38]
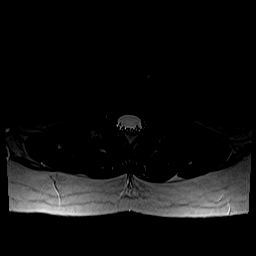
[im 32/38]
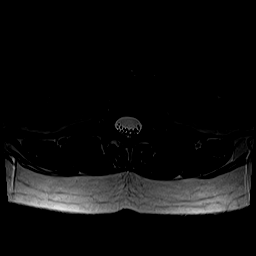
[im 38/38]
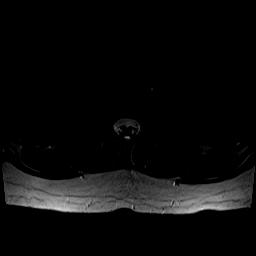

[Series 7: T1 · axial · 4.0mm · 0.39mm/px · z∈[-12,+162]mm · 5 of 38 slices shown (2 of 2)]
[im 1/38]
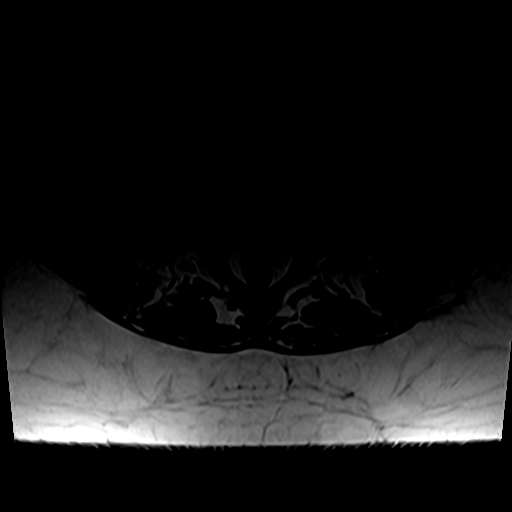
[im 6/38]
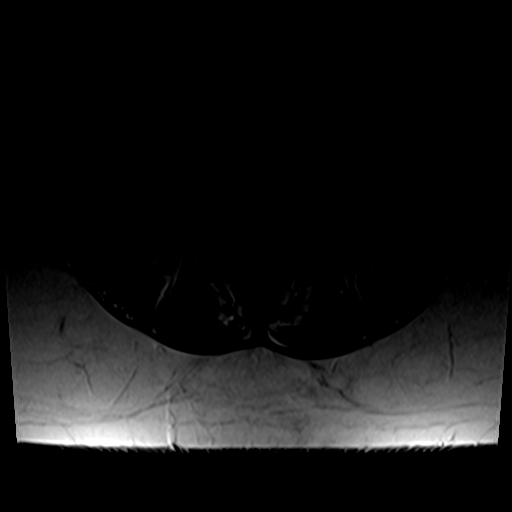
[im 11/38]
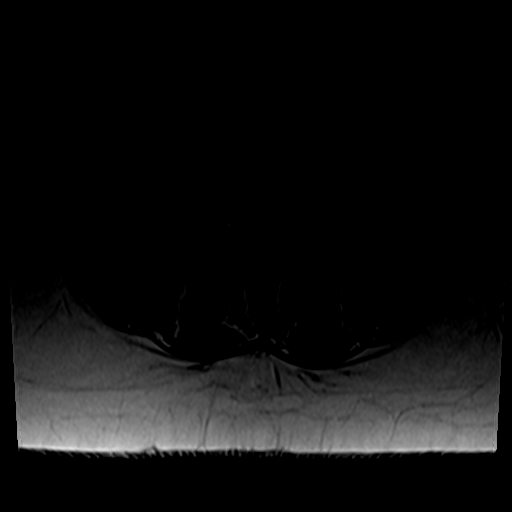
[im 19/38]
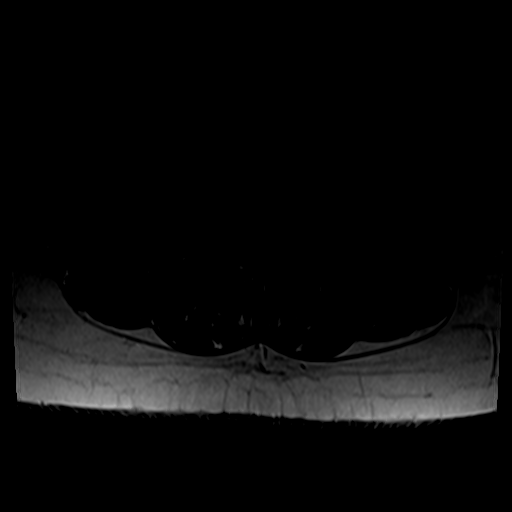
[im 32/38]
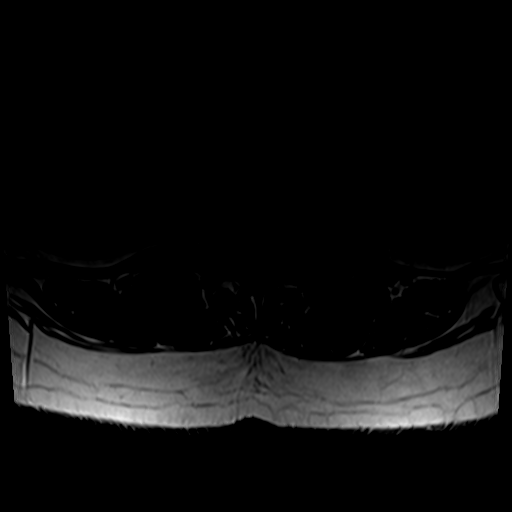

[26 of 48 positions shown; findings below may reference images not displayed]

FINDINGS: Segmentation:  Standard.

Alignment:  Physiologic.

Vertebrae:  No fracture, evidence of discitis, or bone lesion.

Conus medullaris and cauda equina: Conus extends to the T12-L1
level. Conus and cauda equina appear normal.

Paraspinal and other soft tissues: Negative.

Disc levels:

T12-L1 through L4-L5: Negative. Unremarkable intervertebral discs
without disc height loss or focal disc protrusion. No significant
facet joint arthropathy. No foraminal or canal stenosis.

L5-S1: Disc desiccation with mild disc height loss and shallow left
paracentral disc protrusion. Unremarkable facet joints. No foraminal
or canal stenosis.
IMPRESSION: Mild degenerative disc disease at L5-S1 with a shallow
noncompressive left paracentral disc protrusion. No foraminal or
canal stenosis at any level. No findings to explain patient's
right-sided radicular symptoms.

## 2024-06-01 ENCOUNTER — Ambulatory Visit: Payer: Self-pay

## 2024-06-01 NOTE — Telephone Encounter (Signed)
 Noted

## 2024-06-01 NOTE — Telephone Encounter (Signed)
 FYI Only or Action Required?: FYI only for provider: appointment scheduled on 11/4.  Patient was last seen in primary care on 01/06/2024 by Vincente Shivers, NP.  Called Nurse Triage reporting Anxiety.  Symptoms began x 2 months .  Interventions attempted: Prescription medications: fluoxetine  60 mg provider not aware of increased dosage.  Symptoms are: gradually worsening.  Triage Disposition: See PCP When Office is Open (Within 3 Days)  Patient/caregiver understands and will follow disposition?: Yes      Copied from CRM #[id number]. Topic: Clinical - Red Word Triage >> Jun 01, 2024  2:11 PM Eva FALCON wrote: Red Word that prompted transfer to Nurse Triage: Change in anxiety and depression/up dosage on medication on her own. Was really bad up until 2 weeks ago when she up the dosage by herself. Reason for Disposition  MODERATE anxiety (e.g., persistent or frequent anxiety symptoms; interferes with sleep, school, or work)  Answer Assessment - Initial Assessment Questions 1. CONCERN: Did anything happen that prompted you to call today?       Increased anxiety/ depression   2. ANXIETY SYMPTOMS: Can you describe how you (your loved one; patient) have been feeling? (e.g., tense, restless, panicky, anxious, keyed up, overwhelmed, sense of impending doom).       Anxious, due to unemployment   3. ONSET: How long have you been feeling this way? (e.g., hours, days, weeks)     X 2 months  4. SEVERITY: How would you rate the level of anxiety? (e.g., 0 - 10; or mild, moderate, severe).     Mild-moderate   5. FUNCTIONAL IMPAIRMENT: How have these feelings affected your ability to do daily activities? Have you had more difficulty than usual doing your normal daily activities? (e.g., getting better, same, worse; self-care, school, work, interactions)      Not currently, but ongoing problem x 2 months   6. HISTORY: Have you felt this way before? Have you ever been diagnosed with an  anxiety problem in the past? (e.g., generalized anxiety disorder, panic attacks, PTSD). If Yes, ask: How was this problem treated? (e.g., medicines, counseling, etc.)     Anxiety, depression   7. RISK OF HARM - SUICIDAL IDEATION: Do you ever have thoughts of hurting or killing yourself? If Yes, ask:  Do you have these feelings now? Do you have a plan on how you would do this?     No   8. TREATMENT:  What has been done so far to treat this anxiety? (e.g., medicines, relaxation strategies). What has helped?     Fluoxetine .  9. THERAPIST: Do you have a counselor or therapist? If Yes, ask: What is their name?  No         11. PATIENT SUPPORT: Who is with you now? Who do you live with? Do you have family or friends who you can talk to?        Yes, husband   59. OTHER SYMPTOMS: Do you have any other symptoms? (e.g., feeling depressed, trouble concentrating, trouble sleeping, trouble breathing, palpitations or fast heartbeat, chest pain, sweating, nausea, or diarrhea)       Increased depression   13. PREGNANCY: Is there any chance you are pregnant? When was your last menstrual period? No    She reports taking 60mg  of fluoxetine .  Protocols used: Anxiety and Panic Attack-A-AH

## 2024-06-02 ENCOUNTER — Ambulatory Visit: Admitting: General Practice

## 2024-06-02 ENCOUNTER — Encounter: Payer: Self-pay | Admitting: General Practice

## 2024-06-02 VITALS — BP 122/80 | HR 72 | Temp 98.1°F | Ht 65.5 in | Wt 261.8 lb

## 2024-06-02 DIAGNOSIS — F419 Anxiety disorder, unspecified: Secondary | ICD-10-CM | POA: Diagnosis not present

## 2024-06-02 DIAGNOSIS — F330 Major depressive disorder, recurrent, mild: Secondary | ICD-10-CM | POA: Diagnosis not present

## 2024-06-02 MED ORDER — FLUOXETINE HCL 40 MG PO CAPS: 40.0000 mg | ORAL_CAPSULE | Freq: Every day | ORAL | 0 refills | Status: AC

## 2024-06-02 MED ORDER — BUSPIRONE HCL 5 MG PO TABS: 5.0000 mg | ORAL_TABLET | Freq: Two times a day (BID) | ORAL | 1 refills | Status: DC

## 2024-06-02 NOTE — Patient Instructions (Signed)
 Start Prozac  40 mg once daily. ONE TABLET DAILY.  Start Buspar 5 mg twice daily. One tablet twice daily.   Follow up in 4 weeks.   It was a pleasure to see you today!

## 2024-06-02 NOTE — Progress Notes (Signed)
 Established Patient Office Visit  Subjective   Patient ID: Sarah Gonzales, female    DOB: 01/07/1991  Age: 33 y.o. MRN: [id number]  Chief Complaint  Patient presents with   Depression    And anxiety f/u. Patient was on prozac  20 mg 2 caps daily; but 2 weeks ago increased to 3 caps daily on her own. Patient felt like she really needed the increase as things were getting very bad at home. Patient feels much better on the 60 mg caps daily. Discuss the hydroxyzine ; patient thinks its giving her a migraine.     Depression        Associated symptoms include no headaches and no suicidal ideas.   Discussed the use of AI scribe software for clinical note transcription with the patient, who gave verbal consent to proceed.  History of Present Illness Sarah Gonzales is a 33 year old female with depression and anxiety who presents for medication management.  She has a history of depression and anxiety, currently managed with Prozac  on 40 mg daily, and recently self-adjusted to 60 mg two weeks ago due to worsening symptoms. Over the past two months, her depression intensified, causing significant functional impairment. She describes doing minimal daily activities, excessive daytime sleeping, and difficulty engaging with her child. Since increasing the Prozac  dose, she notes some improvement, with reduced napping and increased activity, but continues to experience anxiety, especially related to starting a new job.  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 Her depression symptoms were particularly severe in September, with increased daytime napping and decreased motivation. She also reports increased screen time and difficulty staying off her phone, which she feels contributes to her symptoms.  She experiences anxiety, particularly related to starting a new job soon.  She describes having more 'panic attacky days' and notes that her anxiety has worsened since having her child. She uses hydroxyzine  as needed for anxiety, which she took yesterday due to a particularly rough day.    Patient Active Problem List   Diagnosis Date Noted   Mixed hyperlipidemia 01/06/2024   Encounter for screening and preventative care 01/06/2024   Anxiety 12/09/2023   Benign essential hypertension 04/11/2021   Major depression, recurrent 04/11/2021   BMI 40.0-44.9, adult (HCC) 12/14/2015   Migraine headache 07/07/2008   Past Medical History:  Diagnosis Date   Anxiety    Bartholin cyst    Chronic headaches    Since the age of 33, severe in the past.   De Quervain's tenosynovitis, bilateral 11/10/2021   Depression    Hypertension    Lumbar degenerative disc disease 11/10/2021   Migraines    Mild sleep apnea 12/05/2015   AHI of 5.4. Sleep study 2017.    AHI of 6.2 03/2021. HST     Warts    History reviewed. No pertinent surgical history. Allergies  Allergen Reactions   Azithromycin  Rash         06/02/2024   12:29 PM 01/06/2024   11:08 AM 12/09/2023    8:22 AM  Depression screen PHQ 2/9  Decreased Interest 1 0 0  Down, Depressed, Hopeless 1 0 0  PHQ - 2 Score 2 0 0  Altered sleeping 1  1  Tired, decreased energy 1  0  Change in appetite 0  0  Feeling bad or failure about yourself  0  0  Trouble concentrating 0  0  Moving slowly or fidgety/restless 0  0  Suicidal thoughts 0  0  PHQ-9 Score 4  1  Difficult doing work/chores Somewhat difficult  Not difficult at all       06/02/2024   12:29 PM 12/09/2023    8:22 AM 09/10/2023   12:08 PM 08/20/2023    3:40 PM  GAD 7 : Generalized Anxiety Score  Nervous, Anxious, on Edge 1 2 0 1  Control/stop worrying 0 1 0 0  Worry too much - different things 0 2 0 0  Trouble relaxing 1 1 0 0  Restless 0 0 0 0  Easily annoyed or irritable 1 1 0 0  Afraid - awful might happen 0 0 0 0  Total GAD 7 Score 3 7 0 1  Anxiety  Difficulty Somewhat difficult Somewhat difficult Not difficult at all Not difficult at all      Review of Systems  Constitutional:  Negative for chills and fever.  Respiratory:  Negative for shortness of breath.   Cardiovascular:  Negative for chest pain.  Gastrointestinal:  Negative for abdominal pain, constipation, diarrhea, heartburn, nausea and vomiting.  Genitourinary:  Negative for dysuria, frequency and urgency.  Neurological:  Negative for dizziness and headaches.  Endo/Heme/Allergies:  Negative for polydipsia.  Psychiatric/Behavioral:  Positive for depression. Negative for suicidal ideas. The patient is nervous/anxious.       Objective:     BP 122/80   Pulse 72   Temp 98.1 F (36.7 C) (Temporal)   Ht 5' 5.5 (1.664 m)   Wt 261 lb 12.8 oz (118.8 kg)   SpO2 98%   BMI 42.90 kg/m  BP Readings from Last 3 Encounters:  06/02/24 122/80  01/06/24 122/86  12/09/23 120/82   Wt Readings from Last 3 Encounters:  06/02/24 261 lb 12.8 oz (118.8 kg)  01/06/24 259 lb (117.5 kg)  12/09/23 254 lb (115.2 kg)      Physical Exam Vitals and nursing note reviewed.  Constitutional:      Appearance: Normal appearance.  Cardiovascular:     Rate and Rhythm: Normal rate and regular rhythm.     Pulses: Normal pulses.     Heart sounds: Normal heart sounds.  Pulmonary:     Effort: Pulmonary effort is normal.     Breath sounds: Normal breath sounds.  Neurological:     Mental Status: She is alert and oriented to person, place, and time.  Psychiatric:        Mood and Affect: Mood normal.        Behavior: Behavior normal.        Thought Content: Thought content normal.        Judgment: Judgment normal.      No results found for any visits on 06/02/24.     The ASCVD Risk score (Arnett DK, et al., 2019) failed to calculate for the following reasons:   The 2019 ASCVD risk score is only valid for ages 66 to 35    Assessment & Plan:  Mild episode of recurrent major depressive  disorder -     FLUoxetine  HCl; Take 1 capsule (40 mg total) by mouth daily.  Dispense: 90 capsule; Refill: 0  Anxiety -     busPIRone HCl; Take 1 tablet (5 mg total) by mouth 2 (two) times daily.  Dispense: 60 tablet; Refill: 1    Assessment and Plan Assessment & Plan Generalized anxiety disorder Increased anxiety symptoms, including panic attacks and feeling on  edge, related to new job anticipation.  - Prescribe buspirone 5 mg twice daily. - Continue hydroxyzine  as needed.  - f/u in 4 weeks.  Depression Recent exacerbation led to increased Prozac  dosage by the patient. Improvement noted with increased dosage.  - Continue Prozac  40 mg once daily. - f/u in four weeks.  - discussed medication adherence and management in regards to increasing dose without consulting a physician.    Return in about 4 weeks (around 06/30/2024) for follow up as scheduled.SABRA Carrol Aurora, NP

## 2024-06-17 DIAGNOSIS — M9903 Segmental and somatic dysfunction of lumbar region: Secondary | ICD-10-CM | POA: Diagnosis not present

## 2024-06-17 DIAGNOSIS — M5386 Other specified dorsopathies, lumbar region: Secondary | ICD-10-CM | POA: Diagnosis not present

## 2024-06-17 DIAGNOSIS — M50322 Other cervical disc degeneration at C5-C6 level: Secondary | ICD-10-CM | POA: Diagnosis not present

## 2024-06-17 DIAGNOSIS — M9901 Segmental and somatic dysfunction of cervical region: Secondary | ICD-10-CM | POA: Diagnosis not present

## 2024-06-22 ENCOUNTER — Other Ambulatory Visit: Payer: Self-pay | Admitting: Family Medicine

## 2024-06-22 DIAGNOSIS — M50322 Other cervical disc degeneration at C5-C6 level: Secondary | ICD-10-CM | POA: Diagnosis not present

## 2024-06-22 DIAGNOSIS — M9903 Segmental and somatic dysfunction of lumbar region: Secondary | ICD-10-CM | POA: Diagnosis not present

## 2024-06-22 DIAGNOSIS — M9901 Segmental and somatic dysfunction of cervical region: Secondary | ICD-10-CM | POA: Diagnosis not present

## 2024-06-22 DIAGNOSIS — M5386 Other specified dorsopathies, lumbar region: Secondary | ICD-10-CM | POA: Diagnosis not present

## 2024-06-24 ENCOUNTER — Other Ambulatory Visit: Payer: Self-pay | Admitting: General Practice

## 2024-06-24 DIAGNOSIS — F419 Anxiety disorder, unspecified: Secondary | ICD-10-CM

## 2024-07-06 DIAGNOSIS — M5386 Other specified dorsopathies, lumbar region: Secondary | ICD-10-CM | POA: Diagnosis not present

## 2024-07-06 DIAGNOSIS — M9901 Segmental and somatic dysfunction of cervical region: Secondary | ICD-10-CM | POA: Diagnosis not present

## 2024-07-06 DIAGNOSIS — M9903 Segmental and somatic dysfunction of lumbar region: Secondary | ICD-10-CM | POA: Diagnosis not present

## 2024-07-06 DIAGNOSIS — M50322 Other cervical disc degeneration at C5-C6 level: Secondary | ICD-10-CM | POA: Diagnosis not present

## 2024-07-07 ENCOUNTER — Ambulatory Visit: Payer: Self-pay | Admitting: General Practice

## 2024-07-09 ENCOUNTER — Ambulatory Visit: Admitting: General Practice

## 2024-07-31 ENCOUNTER — Other Ambulatory Visit: Payer: Self-pay | Admitting: General Practice

## 2024-07-31 DIAGNOSIS — F419 Anxiety disorder, unspecified: Secondary | ICD-10-CM

## 2024-08-12 ENCOUNTER — Telehealth: Payer: Self-pay

## 2024-08-12 ENCOUNTER — Encounter: Payer: Self-pay | Admitting: General Practice

## 2024-08-12 ENCOUNTER — Other Ambulatory Visit (HOSPITAL_COMMUNITY): Payer: Self-pay

## 2024-08-12 ENCOUNTER — Ambulatory Visit: Admitting: General Practice

## 2024-08-12 VITALS — BP 124/82 | HR 86 | Temp 98.0°F | Ht 65.5 in | Wt 260.0 lb

## 2024-08-12 DIAGNOSIS — G43009 Migraine without aura, not intractable, without status migrainosus: Secondary | ICD-10-CM | POA: Diagnosis not present

## 2024-08-12 DIAGNOSIS — Z6841 Body Mass Index (BMI) 40.0 and over, adult: Secondary | ICD-10-CM | POA: Diagnosis not present

## 2024-08-12 DIAGNOSIS — F419 Anxiety disorder, unspecified: Secondary | ICD-10-CM | POA: Diagnosis not present

## 2024-08-12 DIAGNOSIS — I10 Essential (primary) hypertension: Secondary | ICD-10-CM | POA: Diagnosis not present

## 2024-08-12 DIAGNOSIS — E782 Mixed hyperlipidemia: Secondary | ICD-10-CM | POA: Diagnosis not present

## 2024-08-12 DIAGNOSIS — F330 Major depressive disorder, recurrent, mild: Secondary | ICD-10-CM | POA: Diagnosis not present

## 2024-08-12 DIAGNOSIS — E66813 Obesity, class 3: Secondary | ICD-10-CM | POA: Diagnosis not present

## 2024-08-12 MED ORDER — AIMOVIG 70 MG/ML ~~LOC~~ SOAJ: 70.0000 mg | SUBCUTANEOUS | 3 refills | Status: DC

## 2024-08-12 NOTE — Progress Notes (Signed)
 "  Established Patient Office Visit  Subjective   Patient ID: Sarah Gonzales, female    DOB: 12-Apr-1991  Age: 34 y.o. MRN: [id number]  Chief Complaint  Patient presents with   chronic care management    Patient here today to follow up on chronic conditions. Patient is doing okay other then having some heart burn. Patient isnt sure if its the combo of some of her meds or what is causing it. Patient states she had one panic attack while on the buspar  and states her panic attack was worse then before being on that.     HPI  Sarah Gonzales is a 34 year old female with past medical history of migraine, HTN, obesity, anxiety, depression, HLD presents today for chronic care management.   Discussed the use of AI scribe software for clinical note transcription with the patient, who gave verbal consent to proceed.  History of Present Illness Sarah Gonzales is a 34 year old female who presents for follow-up on her migraines and anxiety medications.  Migraines have been well-controlled with only one episode in the last two to three months. She uses propranolol  and Maxalt  for management and completed three rounds of Aimovig , initially obtained through a coupon due to lack of insurance coverage. She is uncertain if she can obtain the injections again without the coupon.  Regarding anxiety, she is taking Prozac  40 mg once daily, hydroxyzine  as needed, and recently started Buspar . She experienced a severe panic attack within the first month of starting Buspar , which was more intense than previous episodes. This occurred around late November, coinciding with starting a new job and her son being ill. She took hydroxyzine  twice during this period. Since then, her anxiety has been manageable, with only occasional mild symptoms.  She has been experiencing severe heartburn, most intense after taking her medications. She manages it with a 14-day course of omeprazole, which has been effective. She takes her medications  with food and plenty of water. Her morning medications include buspirone  and Prozac , while propranolol  is taken at night.   She mentions dietary changes, trying to eat healthier.  She has been cooking more at home and has noticed some weight loss, as indicated by fitting into previously tight clothing.    Patient Active Problem List   Diagnosis Date Noted   Mixed hyperlipidemia 01/06/2024   Encounter for screening and preventative care 01/06/2024   Anxiety 12/09/2023   Benign essential hypertension 04/11/2021   Major depression, recurrent 04/11/2021   Obesity 12/14/2015   Migraine headache 07/07/2008   Past Medical History:  Diagnosis Date   Anxiety    Bartholin cyst    Chronic headaches    Since the age of 64, severe in the past.   De Quervain's tenosynovitis, bilateral 11/10/2021   Depression    Hypertension    Lumbar degenerative disc disease 11/10/2021   Migraines    Mild sleep apnea 12/05/2015   AHI of 5.4. Sleep study 2017.    AHI of 6.2 03/2021. HST     Warts    History reviewed. No pertinent surgical history. Allergies[1]       08/12/2024    8:12 AM 06/02/2024   12:29 PM 01/06/2024   11:08 AM  Depression screen PHQ 2/9  Decreased Interest 0 1 0  Down, Depressed, Hopeless 0 1 0  PHQ - 2 Score 0 2 0  Altered sleeping 0 1   Tired, decreased energy 1 1   Change in appetite 0 0  Feeling bad or failure about yourself  0 0   Trouble concentrating 0 0   Moving slowly or fidgety/restless 0 0   Suicidal thoughts 0 0   PHQ-9 Score 1 4    Difficult doing work/chores Not difficult at all Somewhat difficult      Data saved with a previous flowsheet row definition       08/12/2024    8:13 AM 06/02/2024   12:29 PM 12/09/2023    8:22 AM 09/10/2023   12:08 PM  GAD 7 : Generalized Anxiety Score  Nervous, Anxious, on Edge 1 1 2  0  Control/stop worrying 0 0 1 0  Worry too much - different things 0 0 2 0  Trouble relaxing 0 1 1 0  Restless 0 0 0 0  Easily annoyed or  irritable 1 1 1  0  Afraid - awful might happen 0 0 0 0  Total GAD 7 Score 2 3 7  0  Anxiety Difficulty Not difficult at all Somewhat difficult Somewhat difficult Not difficult at all      Review of Systems  Constitutional:  Negative for chills and fever.  Respiratory:  Negative for shortness of breath.   Cardiovascular:  Negative for chest pain.  Gastrointestinal:  Positive for heartburn. Negative for abdominal pain, constipation, diarrhea, nausea and vomiting.  Genitourinary:  Negative for dysuria, frequency and urgency.  Neurological:  Negative for dizziness and headaches.  Endo/Heme/Allergies:  Negative for polydipsia.  Psychiatric/Behavioral:  Negative for depression and suicidal ideas. The patient is not nervous/anxious.       Objective:     BP 124/82   Pulse 86   Temp 98 F (36.7 C) (Temporal)   Ht 5' 5.5 (1.664 m)   Wt 260 lb (117.9 kg)   LMP 07/21/2024 (Exact Date)   SpO2 96%   BMI 42.61 kg/m  BP Readings from Last 3 Encounters:  08/12/24 124/82  06/02/24 122/80  01/06/24 122/86   Wt Readings from Last 3 Encounters:  08/12/24 260 lb (117.9 kg)  06/02/24 261 lb 12.8 oz (118.8 kg)  01/06/24 259 lb (117.5 kg)      Physical Exam Vitals and nursing note reviewed.  Constitutional:      Appearance: Normal appearance.  Cardiovascular:     Rate and Rhythm: Normal rate and regular rhythm.     Pulses: Normal pulses.     Heart sounds: Normal heart sounds.  Pulmonary:     Effort: Pulmonary effort is normal.     Breath sounds: Normal breath sounds.  Neurological:     Mental Status: She is alert and oriented to person, place, and time.  Psychiatric:        Mood and Affect: Mood normal.        Behavior: Behavior normal.        Thought Content: Thought content normal.        Judgment: Judgment normal.      No results found for any visits on 08/12/24.     The ASCVD Risk score (Arnett DK, et al., 2019) failed to calculate for the following reasons:   The  2019 ASCVD risk score is only valid for ages 26 to 56    Assessment & Plan:  Anxiety  Mild episode of recurrent major depressive disorder  Benign essential hypertension Assessment & Plan: At goal.  CMP pending.  Orders: -     Comprehensive metabolic panel with GFR; Future  Migraine without aura and without status migrainosus, not intractable -  Aimovig ; Inject 70 mg into the skin every 30 (thirty) days.  Dispense: 1.12 mL; Refill: 3  Mixed hyperlipidemia -     Lipid panel; Future -     Comprehensive metabolic panel with GFR; Future  Class 3 severe obesity due to excess calories with serious comorbidity and body mass index (BMI) of 40.0 to 44.9 in adult [facility]) Assessment & Plan: Discussed the importance of healthy diet and exercise to affect sustainable weight loss.       Assessment and Plan Assessment & Plan Generalized anxiety disorder and depression. Recent exacerbation likely due to stressor. Initial panic attack after starting Buspirone , now stabilized. - Continue Prozac  40 mg daily, Buspirone  5 mg twice daily, and Hydroxyzine  as needed. - Monitor for recurrence of panic attacks.  Migraine without aura Infrequent migraines, one episode in last 2-3 months. Maxalt  effective but insurance coverage issue. - Sent refill for Aimovig  70mg /ml IM every 30 days  to pharmacy. - Consider neurology referral if not covered. - Continue Propranolol  and Maxalt .   Mixed hyperlipidemia Previous cholesterol levels high. Dietary changes and weight loss noted. - Continue dietary modifications and weight management.  Gastroesophageal reflux disease Severe heartburn episodes managed with Omeprazole. - Continue omeprazole for 14 days and reassess symptoms. - Provided handout on dietary triggers for acid reflux. - Consider Tums for occasional heartburn relief.    Return in about 6 months (around 02/09/2025) for physical and fasting labs.SABRA Carrol Aurora, NP      [1]   Allergies Allergen Reactions   Azithromycin  Rash   "

## 2024-08-12 NOTE — Assessment & Plan Note (Signed)
 At goal.  CMP pending.

## 2024-08-12 NOTE — Assessment & Plan Note (Signed)
 Discussed the importance of healthy diet and exercise to affect sustainable weight loss.

## 2024-08-12 NOTE — Patient Instructions (Addendum)
 Schedule lab ONLY appointment. Please come fasting for four prior to appointment. You are allowed water and black coffee.   Continue monitoring diet and exercise.   Call and schedule pap smear.  Let me know when you need refills.   Follow up in 6 months for physical and fasting labs.   It was a pleasure to see you today!

## 2024-08-12 NOTE — Telephone Encounter (Signed)
 Pharmacy Patient Advocate Encounter   Received notification from [facility] KEY that prior authorization for Aimovig  70 is required/requested.   Insurance verification completed.   The patient is insured through [facility].   Per test claim: PA required; PA submitted to above mentioned insurance via Latent Key/confirmation #/EOC [id number] Status is pending

## 2024-08-14 ENCOUNTER — Other Ambulatory Visit

## 2024-08-14 ENCOUNTER — Ambulatory Visit: Payer: Self-pay | Admitting: General Practice

## 2024-08-14 DIAGNOSIS — I10 Essential (primary) hypertension: Secondary | ICD-10-CM

## 2024-08-14 DIAGNOSIS — E782 Mixed hyperlipidemia: Secondary | ICD-10-CM | POA: Diagnosis not present

## 2024-08-14 LAB — COMPREHENSIVE METABOLIC PANEL WITH GFR
ALT: 13 U/L (ref 3–35)
AST: 13 U/L (ref 5–37)
Albumin: 4.4 g/dL (ref 3.5–5.2)
Alkaline Phosphatase: 69 U/L (ref 39–117)
BUN: 8 mg/dL (ref 6–23)
CO2: 27 meq/L (ref 19–32)
Calcium: 9.2 mg/dL (ref 8.4–10.5)
Chloride: 103 meq/L (ref 96–112)
Creatinine, Ser: 0.62 mg/dL (ref 0.40–1.20)
GFR: 116.7 mL/min
Glucose, Bld: 91 mg/dL (ref 70–99)
Potassium: 4.1 meq/L (ref 3.5–5.1)
Sodium: 138 meq/L (ref 135–145)
Total Bilirubin: 0.4 mg/dL (ref 0.2–1.2)
Total Protein: 7.4 g/dL (ref 6.0–8.3)

## 2024-08-14 LAB — LIPID PANEL
Cholesterol: 191 mg/dL (ref 28–200)
HDL: 38.6 mg/dL — ABNORMAL LOW
LDL Cholesterol: 127 mg/dL — ABNORMAL HIGH (ref 10–99)
NonHDL: 152.11
Total CHOL/HDL Ratio: 5
Triglycerides: 128 mg/dL (ref 10.0–149.0)
VLDL: 25.6 mg/dL (ref 0.0–40.0)

## 2024-08-17 ENCOUNTER — Other Ambulatory Visit (HOSPITAL_COMMUNITY): Payer: Self-pay

## 2024-08-17 NOTE — Telephone Encounter (Signed)
 Additional information has been requested from the patient's insurance in order to proceed with the prior authorization request. Requested information has been sent, or form has been filled out and faxed back to [phone]   case key# [id number]

## 2024-08-26 ENCOUNTER — Ambulatory Visit

## 2024-08-26 VITALS — BP 115/55 | HR 84 | Resp 16 | Ht 65.5 in | Wt 262.2 lb

## 2024-08-26 DIAGNOSIS — N912 Amenorrhea, unspecified: Secondary | ICD-10-CM

## 2024-08-26 DIAGNOSIS — O3680X0 Pregnancy with inconclusive fetal viability, not applicable or unspecified: Secondary | ICD-10-CM

## 2024-08-26 DIAGNOSIS — Z3201 Encounter for pregnancy test, result positive: Secondary | ICD-10-CM | POA: Diagnosis not present

## 2024-08-26 LAB — POCT URINE PREGNANCY: Preg Test, Ur: POSITIVE — AB

## 2024-08-26 NOTE — Patient Instructions (Signed)
 Commonly Asked Questions During Pregnancy  Cats: A parasite can be excreted in cat feces.  To avoid exposure you need to have another person empty the little box.  If you must empty the litter box you will need to wear gloves.  Wash your hands after handling your cat.  This parasite can also be found in raw or undercooked meat so this should also be avoided.  Colds, Sore Throats, Flu: Please check your medication sheet to see what you can take for symptoms.  If your symptoms are unrelieved by these medications please call the office.  Dental Work: Most any dental work agricultural consultant recommends is permitted.  X-rays should only be taken during the first trimester if absolutely necessary.  Your abdomen should be shielded with a lead apron during all x-rays.  Please notify your provider prior to receiving any x-rays.  Novocaine is fine; gas is not recommended.  If your dentist requires a note from us  prior to dental work please call the office and we will provide one for you.  Exercise: Exercise is an important part of staying healthy during your pregnancy.  You may continue most exercises you were accustomed to prior to pregnancy.  Later in your pregnancy you will most likely notice you have difficulty with activities requiring balance like riding a bicycle.  It is important that you listen to your body and avoid activities that put you at a higher risk of falling.  Adequate rest and staying well hydrated are a must!  If you have questions about the safety of specific activities ask your provider.    Exposure to Children with illness: Try to avoid obvious exposure; report any symptoms to us  when noted,  If you have chicken pos, red measles or mumps, you should be immune to these diseases.   Please do not take any vaccines while pregnant unless you have checked with your OB provider.  Fetal Movement: After 28 weeks we recommend you do kick counts twice daily.  Lie or sit down in a calm quiet environment and  count your baby movements kicks.  You should feel your baby at least 10 times per hour.  If you have not felt 10 kicks within the first hour get up, walk around and have something sweet to eat or drink then repeat for an additional hour.  If count remains less than 10 per hour notify your provider.  Fumigating: Follow your pest control agent's advice as to how long to stay out of your home.  Ventilate the area well before re-entering.  Hemorrhoids:   Most over-the-counter preparations can be used during pregnancy.  Check your medication to see what is safe to use.  It is important to use a stool softener or fiber in your diet and to drink lots of liquids.  If hemorrhoids seem to be getting worse please call the office.   Hot Tubs:  Hot tubs Jacuzzis and saunas are not recommended while pregnant.  These increase your internal body temperature and should be avoided.  Intercourse:  Sexual intercourse is safe during pregnancy as long as you are comfortable, unless otherwise advised by your provider.  Spotting may occur after intercourse; report any bright red bleeding that is heavier than spotting.  Labor:  If you know that you are in labor, please go to the hospital.  If you are unsure, please call the office and let us  help you decide what to do.  Lifting, straining, etc:  If your job requires heavy  lifting or straining please check with your provider for any limitations.  Generally, you should not lift items heavier than that you can lift simply with your hands and arms (no back muscles)  Painting:  Paint fumes do not harm your pregnancy, but may make you ill and should be avoided if possible.  Latex or water based paints have less odor than oils.  Use adequate ventilation while painting.  Permanents & Hair Color:  Chemicals in hair dyes are not recommended as they cause increase hair dryness which can increase hair loss during pregnancy.   Highlighting and permanents are allowed.  Dye may be  absorbed differently and permanents may not hold as well during pregnancy.  Sunbathing:  Use a sunscreen, as skin burns easily during pregnancy.  Drink plenty of fluids; avoid over heating.  Tanning Beds:  Because their possible side effects are still unknown, tanning beds are not recommended.  Ultrasound Scans:  Routine ultrasounds are performed at approximately 20 weeks.  You will be able to see your baby's general anatomy an if you would like to know the gender this can usually be determined as well.  If it is questionable when you conceived you may also receive an ultrasound early in your pregnancy for dating purposes.  Otherwise ultrasound exams are not routinely performed unless there is a medical necessity.  Although you can request a scan we ask that you pay for it when conducted because insurance does not cover  patient request scans.  Work: If your pregnancy proceeds without complications you may work until your due date, unless your physician or employer advises otherwise.  Round Ligament Pain/Pelvic Discomfort:  Sharp, shooting pains not associated with bleeding are fairly common, usually occurring in the second trimester of pregnancy.  They tend to be worse when standing up or when you remain standing for long periods of time.  These are the result of pressure of certain pelvic ligaments called round ligaments.  Rest, Tylenol  and heat seem to be the most effective relief.  As the womb and fetus grow, they rise out of the pelvis and the discomfort improves.  Please notify the office if your pain seems different than that described.  It may represent a more serious condition.   Morning Sickness  Morning sickness is when you throw up or feel like you may throw up during pregnancy. This condition often occurs in the morning, but it can also occur at any time of day. Morning sickness is most common during the first three months of pregnancy, but it can go on throughout the  pregnancy. Morning sickness is usually harmless. But if you throw up all the time, you should see your health care provider. You may also hear this condition called nausea and vomiting of pregnancy. What are the causes? The cause of morning sickness is not known. It may be linked to changes in hormones during pregnancy. What increases the risk? You're more likely to have morning sickness if: You had morning sickness in another pregnancy. You're pregnant with more than one baby, such as twins. You had morning sickness in other pregnancies. You have had motion sickness before you were pregnant. You have had bad headaches or migraines before you were pregnant. What are the signs or symptoms? Symptoms of morning sickness include: Feeling like you may throw up. Throwing up. How is this diagnosed? Morning sickness is diagnosed based on your symptoms. How is this treated? Treatment is usually not needed for morning sickness. You may only  need to change what you eat. In some cases, your provider may give you: Vitamin B6 supplements. Medicines to prevent throwing up. Ginger. Follow these instructions at home: Medicines Take your medicines only as told by your provider. Do not use any prescription, over-the-counter, or herbal medicines for morning sickness without first talking with your provider. Take prenatal vitamins. These can stop or lessen the symptoms of morning sickness. If you feel like you may throw up after taking prenatal vitamins, take them at night or with a snack. Eating and drinking     Eat dry toast or crackers before getting out of bed. Eat 5 or 6 small meals a day. Try ginger ale made with real ginger, ginger tea, or ginger candies. Drink fluids throughout the day. Eat protein foods when you need a snack. Nuts, yogurt, and cheese are good choices. Eat dry and bland foods like rice or baked potatoes. Foods that are high in carbohydrates are often helpful. Have someone  cook for you if the smell of food makes you want to throw up. Foods to avoid Greasy foods. Fatty foods. Spicy foods. General instructions Try to avoid smells that make you feel sick. Use an air purifier to keep the air in your house free of smells. Try using an acupressure wristband. This is a wristband that's used to treat motion sickness. Try acupuncture. In this treatment, a provider puts thin needles into certain areas of your body to make you feel better. Brush your teeth after throwing up or rinse with a mix of baking soda and water. The acid in throw-up can hurt your teeth. Contact a health care provider if: Your symptoms do not get better. You feel dizzy or light-headed. You're losing weight. Get help right away if: The feeling that you may throw up will not go away, or you can't stop throwing up. You faint. You have very bad pain in your belly. This information is not intended to replace advice given to you by your health care provider. Make sure you discuss any questions you have with your health care provider. Document Revised: 04/18/2023 Document Reviewed: 10/25/2022 Elsevier Patient Education  2024 [employer].

## 2024-08-26 NOTE — Progress Notes (Signed)
" ° ° °  NURSE VISIT NOTE  Subjective:    Patient ID: Sarah Gonzales, female    DOB: [birth year], 34 y.o.   MRN: [id number]  HPI  Patient is a 34 y.o. G5P1001 female who presents for evaluation of amenorrhea. She believes she could be pregnant. Pregnancy is desired. Sexual Activity: single partner, contraception: none. Current symptoms also include: breast tenderness, fatigue, morning sickness, nausea, and positive home pregnancy test. Last period was normal.    Objective:    Ht 5' 5.5 (1.664 m)   LMP 07/21/2024 (Exact Date)   BMI 42.61 kg/m   Lab Review  No results found for any visits on 08/26/24.  Assessment:   1. Amenorrhea   2. Pregnancy of unknown anatomic location     Plan:   Pregnancy Test: Positive  Estimated Date of Delivery: 04/27/2025 BP Cuff Measurement taken. Cuff Size Adult Large Encouraged well-balanced diet, plenty of rest when needed, pre-natal vitamins daily and walking for exercise.  Discussed self-help for nausea, avoiding OTC medications until consulting provider or pharmacist, other than Tylenol  as needed, minimal caffeine  (1-2 cups daily) and avoiding alcohol.   She will schedule her nurse visit @ 7-[redacted] wks pregnant, u/s for dating @10  wk, and NOB visit at [redacted] wk pregnant.    Feel free to call with any questions.     Camelia Fetters, CMA [facility]

## 2024-08-29 ENCOUNTER — Encounter: Payer: Self-pay | Admitting: General Practice

## 2024-08-29 DIAGNOSIS — F3341 Major depressive disorder, recurrent, in partial remission: Secondary | ICD-10-CM

## 2024-08-29 DIAGNOSIS — F419 Anxiety disorder, unspecified: Secondary | ICD-10-CM

## 2024-09-01 ENCOUNTER — Telehealth: Payer: Self-pay

## 2024-09-01 NOTE — Telephone Encounter (Signed)
 Sarah Gonzales called into triage line about some bleeding and she's currently [redacted] week pregnant, left voicemail for her to return my call

## 2024-09-03 ENCOUNTER — Telehealth: Payer: Self-pay

## 2024-09-03 ENCOUNTER — Other Ambulatory Visit: Payer: Self-pay | Admitting: Obstetrics

## 2024-09-03 DIAGNOSIS — N3946 Mixed incontinence: Secondary | ICD-10-CM

## 2024-09-03 NOTE — Telephone Encounter (Signed)
 Patient called triage line requesting another referral to pelvic floor therapy.  [name] CNM saw her last March at which time she was experiencing increased urge/stress incontinence and pelvic pain.  She was referred and attended sessions through July and felt a lot of improvement in symptoms.  She is now [redacted] weeks pregnant and has noticed a dramatic increase in her incontinence symptoms (no pelvic pain).  She attempted to make an appointment for PT but they said she would need another referral.  She is scheduled to start prenatal care with us  next month when she is further along in her pregnancy, but she wonders if she can have another referral now so she can get started with the therapy again.  Request sent to Missy for referral.

## 2024-09-03 NOTE — Progress Notes (Signed)
 Referral sent for pelvic PT

## 2024-09-14 ENCOUNTER — Telehealth

## 2024-09-30 ENCOUNTER — Other Ambulatory Visit

## 2024-10-02 ENCOUNTER — Other Ambulatory Visit

## 2024-10-14 ENCOUNTER — Encounter: Admitting: Certified Nurse Midwife

## 2025-02-09 ENCOUNTER — Encounter: Admitting: General Practice
